# Patient Record
Sex: Male | Born: 1975 | Hispanic: No | Smoking: Current every day smoker
Health system: Southern US, Community
[De-identification: ages and names within clinical notes are randomized; demographics above are authoritative.]

## PROBLEM LIST (undated history)

## (undated) DIAGNOSIS — J45909 Unspecified asthma, uncomplicated: Secondary | ICD-10-CM

## (undated) DIAGNOSIS — I8500 Esophageal varices without bleeding: Secondary | ICD-10-CM

## (undated) DIAGNOSIS — I861 Scrotal varices: Secondary | ICD-10-CM

## (undated) DIAGNOSIS — K920 Hematemesis: Secondary | ICD-10-CM

## (undated) DIAGNOSIS — F19929 Other psychoactive substance use, unspecified with intoxication, unspecified: Secondary | ICD-10-CM

## (undated) DIAGNOSIS — B1920 Unspecified viral hepatitis C without hepatic coma: Secondary | ICD-10-CM

## (undated) DIAGNOSIS — F1994 Other psychoactive substance use, unspecified with psychoactive substance-induced mood disorder: Secondary | ICD-10-CM

## (undated) DIAGNOSIS — J40 Bronchitis, not specified as acute or chronic: Secondary | ICD-10-CM

## (undated) HISTORY — PX: SHOULDER SURGERY: SHX246

## (undated) HISTORY — DX: Unspecified viral hepatitis C without hepatic coma: B19.20

---
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
## 1998-05-24 ENCOUNTER — Encounter: Payer: Self-pay | Admitting: Emergency Medicine

## 1998-05-24 ENCOUNTER — Emergency Department (HOSPITAL_COMMUNITY): Admission: EM | Admit: 1998-05-24 | Discharge: 1998-05-24 | Payer: Self-pay | Admitting: Emergency Medicine

## 1998-05-29 ENCOUNTER — Encounter: Admission: RE | Admit: 1998-05-29 | Discharge: 1998-05-29 | Payer: Self-pay | Admitting: Sports Medicine

## 1998-06-09 ENCOUNTER — Encounter: Admission: RE | Admit: 1998-06-09 | Discharge: 1998-06-23 | Payer: Self-pay | Admitting: Orthopedic Surgery

## 1998-06-27 ENCOUNTER — Ambulatory Visit (HOSPITAL_BASED_OUTPATIENT_CLINIC_OR_DEPARTMENT_OTHER): Admission: RE | Admit: 1998-06-27 | Discharge: 1998-06-27 | Payer: Self-pay | Admitting: Orthopedic Surgery

## 1998-07-04 ENCOUNTER — Encounter: Admission: RE | Admit: 1998-07-04 | Discharge: 1998-07-04 | Payer: Self-pay | Admitting: Family Medicine

## 1998-07-05 ENCOUNTER — Emergency Department (HOSPITAL_COMMUNITY): Admission: EM | Admit: 1998-07-05 | Discharge: 1998-07-05 | Payer: Self-pay | Admitting: Emergency Medicine

## 1998-07-05 ENCOUNTER — Encounter: Payer: Self-pay | Admitting: Emergency Medicine

## 1998-07-22 ENCOUNTER — Encounter: Admission: RE | Admit: 1998-07-22 | Discharge: 1998-07-22 | Payer: Self-pay | Admitting: Family Medicine

## 1998-08-07 ENCOUNTER — Encounter: Admission: RE | Admit: 1998-08-07 | Discharge: 1998-08-07 | Payer: Self-pay | Admitting: Family Medicine

## 1998-12-01 ENCOUNTER — Encounter: Admission: RE | Admit: 1998-12-01 | Discharge: 1998-12-01 | Payer: Self-pay | Admitting: Family Medicine

## 1998-12-15 ENCOUNTER — Encounter: Admission: RE | Admit: 1998-12-15 | Discharge: 1998-12-15 | Payer: Self-pay | Admitting: Family Medicine

## 1999-01-09 ENCOUNTER — Ambulatory Visit (HOSPITAL_BASED_OUTPATIENT_CLINIC_OR_DEPARTMENT_OTHER): Admission: RE | Admit: 1999-01-09 | Discharge: 1999-01-09 | Payer: Self-pay | Admitting: Otolaryngology

## 1999-08-30 ENCOUNTER — Emergency Department (HOSPITAL_COMMUNITY): Admission: EM | Admit: 1999-08-30 | Discharge: 1999-08-30 | Payer: Self-pay | Admitting: Emergency Medicine

## 1999-10-24 ENCOUNTER — Emergency Department (HOSPITAL_COMMUNITY): Admission: EM | Admit: 1999-10-24 | Discharge: 1999-10-24 | Payer: Self-pay | Admitting: Internal Medicine

## 1999-11-30 ENCOUNTER — Emergency Department (HOSPITAL_COMMUNITY): Admission: EM | Admit: 1999-11-30 | Discharge: 1999-11-30 | Payer: Self-pay | Admitting: Emergency Medicine

## 2001-09-27 ENCOUNTER — Emergency Department (HOSPITAL_COMMUNITY): Admission: EM | Admit: 2001-09-27 | Discharge: 2001-09-27 | Payer: Self-pay | Admitting: Emergency Medicine

## 2001-09-28 ENCOUNTER — Emergency Department (HOSPITAL_COMMUNITY): Admission: EM | Admit: 2001-09-28 | Discharge: 2001-09-28 | Payer: Self-pay

## 2001-10-31 ENCOUNTER — Emergency Department (HOSPITAL_COMMUNITY): Admission: EM | Admit: 2001-10-31 | Discharge: 2001-10-31 | Payer: Self-pay | Admitting: Emergency Medicine

## 2001-11-07 ENCOUNTER — Emergency Department (HOSPITAL_COMMUNITY): Admission: EM | Admit: 2001-11-07 | Discharge: 2001-11-07 | Payer: Self-pay | Admitting: Emergency Medicine

## 2001-11-29 ENCOUNTER — Emergency Department (HOSPITAL_COMMUNITY): Admission: EM | Admit: 2001-11-29 | Discharge: 2001-11-29 | Payer: Self-pay | Admitting: *Deleted

## 2001-12-29 ENCOUNTER — Encounter: Payer: Self-pay | Admitting: Emergency Medicine

## 2001-12-29 ENCOUNTER — Emergency Department (HOSPITAL_COMMUNITY): Admission: EM | Admit: 2001-12-29 | Discharge: 2001-12-29 | Payer: Self-pay | Admitting: Emergency Medicine

## 2002-01-03 ENCOUNTER — Encounter: Payer: Self-pay | Admitting: Emergency Medicine

## 2002-01-03 ENCOUNTER — Emergency Department (HOSPITAL_COMMUNITY): Admission: EM | Admit: 2002-01-03 | Discharge: 2002-01-03 | Payer: Self-pay | Admitting: Emergency Medicine

## 2004-07-19 ENCOUNTER — Emergency Department (HOSPITAL_COMMUNITY): Admission: EM | Admit: 2004-07-19 | Discharge: 2004-07-19 | Payer: Self-pay | Admitting: Emergency Medicine

## 2004-08-03 ENCOUNTER — Emergency Department (HOSPITAL_COMMUNITY): Admission: EM | Admit: 2004-08-03 | Discharge: 2004-08-03 | Payer: Self-pay | Admitting: Emergency Medicine

## 2004-12-27 ENCOUNTER — Emergency Department (HOSPITAL_COMMUNITY): Admission: EM | Admit: 2004-12-27 | Discharge: 2004-12-27 | Payer: Self-pay | Admitting: Emergency Medicine

## 2005-02-03 ENCOUNTER — Emergency Department (HOSPITAL_COMMUNITY): Admission: EM | Admit: 2005-02-03 | Discharge: 2005-02-03 | Payer: Self-pay | Admitting: Emergency Medicine

## 2005-03-07 ENCOUNTER — Emergency Department (HOSPITAL_COMMUNITY): Admission: EM | Admit: 2005-03-07 | Discharge: 2005-03-08 | Payer: Self-pay | Admitting: Emergency Medicine

## 2005-03-08 ENCOUNTER — Emergency Department (HOSPITAL_COMMUNITY): Admission: EM | Admit: 2005-03-08 | Discharge: 2005-03-08 | Payer: Self-pay | Admitting: Emergency Medicine

## 2005-04-21 ENCOUNTER — Emergency Department (HOSPITAL_COMMUNITY): Admission: EM | Admit: 2005-04-21 | Discharge: 2005-04-21 | Payer: Self-pay | Admitting: *Deleted

## 2005-12-18 ENCOUNTER — Emergency Department (HOSPITAL_COMMUNITY): Admission: EM | Admit: 2005-12-18 | Discharge: 2005-12-18 | Payer: Self-pay | Admitting: Emergency Medicine

## 2006-08-28 ENCOUNTER — Emergency Department (HOSPITAL_COMMUNITY): Admission: EM | Admit: 2006-08-28 | Discharge: 2006-08-28 | Payer: Self-pay | Admitting: Emergency Medicine

## 2006-09-09 ENCOUNTER — Emergency Department (HOSPITAL_COMMUNITY): Admission: EM | Admit: 2006-09-09 | Discharge: 2006-09-09 | Payer: Self-pay | Admitting: Emergency Medicine

## 2007-04-12 ENCOUNTER — Emergency Department (HOSPITAL_COMMUNITY): Admission: EM | Admit: 2007-04-12 | Discharge: 2007-04-12 | Payer: Self-pay | Admitting: Emergency Medicine

## 2007-06-09 ENCOUNTER — Emergency Department (HOSPITAL_COMMUNITY): Admission: EM | Admit: 2007-06-09 | Discharge: 2007-06-09 | Payer: Self-pay | Admitting: Emergency Medicine

## 2007-08-10 ENCOUNTER — Emergency Department (HOSPITAL_COMMUNITY): Admission: EM | Admit: 2007-08-10 | Discharge: 2007-08-10 | Payer: Self-pay | Admitting: Emergency Medicine

## 2008-08-15 ENCOUNTER — Emergency Department (HOSPITAL_COMMUNITY): Admission: EM | Admit: 2008-08-15 | Discharge: 2008-08-15 | Payer: Self-pay | Admitting: Emergency Medicine

## 2009-01-05 ENCOUNTER — Emergency Department (HOSPITAL_COMMUNITY): Admission: EM | Admit: 2009-01-05 | Discharge: 2009-01-05 | Payer: Self-pay | Admitting: Emergency Medicine

## 2009-02-21 ENCOUNTER — Emergency Department (HOSPITAL_COMMUNITY): Admission: EM | Admit: 2009-02-21 | Discharge: 2009-02-21 | Payer: Self-pay | Admitting: Emergency Medicine

## 2009-05-27 ENCOUNTER — Emergency Department (HOSPITAL_COMMUNITY): Admission: EM | Admit: 2009-05-27 | Discharge: 2009-05-28 | Payer: Self-pay | Admitting: Emergency Medicine

## 2009-09-09 ENCOUNTER — Inpatient Hospital Stay (HOSPITAL_COMMUNITY): Admission: EM | Admit: 2009-09-09 | Discharge: 2009-09-13 | Payer: Self-pay | Admitting: Emergency Medicine

## 2009-09-16 ENCOUNTER — Emergency Department (HOSPITAL_COMMUNITY): Admission: EM | Admit: 2009-09-16 | Discharge: 2009-09-16 | Payer: Self-pay | Admitting: Emergency Medicine

## 2009-10-20 ENCOUNTER — Emergency Department (HOSPITAL_COMMUNITY): Admission: EM | Admit: 2009-10-20 | Discharge: 2009-10-20 | Payer: Self-pay | Admitting: Emergency Medicine

## 2010-02-02 ENCOUNTER — Emergency Department (HOSPITAL_COMMUNITY): Admission: EM | Admit: 2010-02-02 | Discharge: 2010-02-02 | Payer: Self-pay | Admitting: Emergency Medicine

## 2010-02-06 ENCOUNTER — Emergency Department (HOSPITAL_COMMUNITY): Admission: EM | Admit: 2010-02-06 | Discharge: 2010-02-06 | Payer: Self-pay | Admitting: Emergency Medicine

## 2010-02-17 ENCOUNTER — Emergency Department (HOSPITAL_COMMUNITY): Admission: EM | Admit: 2010-02-17 | Discharge: 2010-02-18 | Payer: Self-pay | Admitting: Emergency Medicine

## 2010-02-21 ENCOUNTER — Emergency Department (HOSPITAL_COMMUNITY): Admission: EM | Admit: 2010-02-21 | Discharge: 2010-02-21 | Payer: Self-pay | Admitting: Emergency Medicine

## 2010-04-28 ENCOUNTER — Emergency Department (HOSPITAL_COMMUNITY)
Admission: EM | Admit: 2010-04-28 | Discharge: 2010-04-28 | Payer: Self-pay | Source: Home / Self Care | Admitting: Emergency Medicine

## 2010-05-12 ENCOUNTER — Emergency Department (HOSPITAL_COMMUNITY)
Admission: EM | Admit: 2010-05-12 | Discharge: 2010-05-12 | Payer: Self-pay | Source: Home / Self Care | Admitting: Emergency Medicine

## 2010-05-25 ENCOUNTER — Emergency Department (HOSPITAL_COMMUNITY)
Admission: EM | Admit: 2010-05-25 | Discharge: 2010-05-25 | Payer: Self-pay | Source: Home / Self Care | Admitting: Emergency Medicine

## 2010-06-05 ENCOUNTER — Emergency Department (HOSPITAL_COMMUNITY)
Admission: EM | Admit: 2010-06-05 | Discharge: 2010-06-05 | Payer: Self-pay | Source: Home / Self Care | Admitting: Emergency Medicine

## 2010-07-11 ENCOUNTER — Emergency Department (HOSPITAL_COMMUNITY)
Admission: EM | Admit: 2010-07-11 | Discharge: 2010-07-11 | Disposition: A | Discharge status: Home / Self Care | Payer: Self-pay | Attending: Emergency Medicine | Admitting: Emergency Medicine

## 2010-07-11 DIAGNOSIS — J45909 Unspecified asthma, uncomplicated: Secondary | ICD-10-CM | POA: Insufficient documentation

## 2010-07-11 DIAGNOSIS — M79609 Pain in unspecified limb: Secondary | ICD-10-CM | POA: Insufficient documentation

## 2010-07-11 DIAGNOSIS — F411 Generalized anxiety disorder: Secondary | ICD-10-CM | POA: Insufficient documentation

## 2010-07-11 LAB — POCT I-STAT, CHEM 8
BUN: 14 mg/dL (ref 6–23)
Creatinine, Ser: 1 mg/dL (ref 0.4–1.5)
Glucose, Bld: 88 mg/dL (ref 70–99)
Potassium: 4 mEq/L (ref 3.5–5.1)
Sodium: 140 mEq/L (ref 135–145)

## 2010-08-05 LAB — COMPREHENSIVE METABOLIC PANEL
AST: 22 U/L (ref 0–37)
Albumin: 4.2 g/dL (ref 3.5–5.2)
Calcium: 8.9 mg/dL (ref 8.4–10.5)
Creatinine, Ser: 0.97 mg/dL (ref 0.4–1.5)
GFR calc Af Amer: >60 mL/min (ref >60)
GFR calc non Af Amer: >60 mL/min (ref >60)
Total Protein: 6.6 g/dL (ref 6.0–8.3)

## 2010-08-05 LAB — CBC
MCH: 29.2 pg (ref 26.0–34.0)
MCHC: 34.9 g/dL (ref 30.0–36.0)
Platelets: 242 10*3/uL (ref 150–400)
RDW: 12.4 % (ref 11.5–15.5)

## 2010-08-05 LAB — RAPID URINE DRUG SCREEN, HOSP PERFORMED
Amphetamines: NOT DETECTED
Cocaine: POSITIVE — AB
Opiates: NOT DETECTED
Tetrahydrocannabinol: POSITIVE — AB

## 2010-08-05 LAB — LIPASE, BLOOD: Lipase: 26 U/L (ref 11–59)

## 2010-08-05 LAB — POCT CARDIAC MARKERS
CKMB, poc: 1.8 ng/mL (ref 1.0–8.0)
Troponin i, poc: <0.05 ng/mL (ref 0.00–0.09)

## 2010-08-05 LAB — DIFFERENTIAL
Eosinophils Relative: 1 % (ref 0–5)
Lymphocytes Relative: 23 % (ref 12–46)
Lymphs Abs: 2.1 10*3/uL (ref 0.7–4.0)
Monocytes Relative: 8 % (ref 3–12)

## 2010-08-05 LAB — ETHANOL: Alcohol, Ethyl (B): 133 mg/dL — ABNORMAL HIGH (ref 0–10)

## 2010-08-11 LAB — RENAL FUNCTION PANEL
Albumin: 3.3 g/dL — ABNORMAL LOW (ref 3.5–5.2)
Albumin: 3.3 g/dL — ABNORMAL LOW (ref 3.5–5.2)
CO2: 24 mEq/L (ref 19–32)
Calcium: 8.7 mg/dL (ref 8.4–10.5)
GFR calc Af Amer: >60 mL/min (ref >60)
GFR calc Af Amer: >60 mL/min (ref >60)
GFR calc non Af Amer: >60 mL/min (ref >60)
Phosphorus: 2.4 mg/dL (ref 2.3–4.6)
Phosphorus: 3.5 mg/dL (ref 2.3–4.6)
Potassium: 3.9 mEq/L (ref 3.5–5.1)
Sodium: 139 mEq/L (ref 135–145)
Sodium: 142 mEq/L (ref 135–145)

## 2010-08-11 LAB — BASIC METABOLIC PANEL
Calcium: 8.1 mg/dL — ABNORMAL LOW (ref 8.4–10.5)
Creatinine, Ser: 0.94 mg/dL (ref 0.4–1.5)
GFR calc Af Amer: >60 mL/min (ref >60)
GFR calc non Af Amer: >60 mL/min (ref >60)
Glucose, Bld: 107 mg/dL — ABNORMAL HIGH (ref 70–99)
Sodium: 133 mEq/L — ABNORMAL LOW (ref 135–145)

## 2010-08-11 LAB — POCT I-STAT 3, ART BLOOD GAS (G3+)
Patient temperature: 98.6
TCO2: 27 mmol/L (ref 0–100)
pH, Arterial: 7.389 (ref 7.350–7.450)

## 2010-08-11 LAB — URINALYSIS, ROUTINE W REFLEX MICROSCOPIC
Bilirubin Urine: NEGATIVE
Hgb urine dipstick: NEGATIVE
Specific Gravity, Urine: 1.036 — ABNORMAL HIGH (ref 1.005–1.030)
pH: 7.5 (ref 5.0–8.0)

## 2010-08-11 LAB — POCT I-STAT, CHEM 8
Creatinine, Ser: 0.8 mg/dL (ref 0.4–1.5)
Hemoglobin: 20.7 g/dL — ABNORMAL HIGH (ref 13.0–17.0)
Sodium: 141 mEq/L (ref 135–145)
TCO2: 21 mmol/L (ref 0–100)

## 2010-08-11 LAB — CK
Total CK: 244 U/L — ABNORMAL HIGH (ref 7–232)
Total CK: 253 U/L — ABNORMAL HIGH (ref 7–232)

## 2010-08-11 LAB — COMPREHENSIVE METABOLIC PANEL
ALT: 21 U/L (ref 0–53)
Albumin: 4.4 g/dL (ref 3.5–5.2)
Alkaline Phosphatase: 101 U/L (ref 39–117)
Glucose, Bld: 117 mg/dL — ABNORMAL HIGH (ref 70–99)
Potassium: 4 mEq/L (ref 3.5–5.1)
Sodium: 140 mEq/L (ref 135–145)
Total Protein: 7.4 g/dL (ref 6.0–8.3)

## 2010-08-11 LAB — CBC
Hemoglobin: 14.1 g/dL (ref 13.0–17.0)
Hemoglobin: 18.1 g/dL — ABNORMAL HIGH (ref 13.0–17.0)
Platelets: 223 10*3/uL (ref 150–400)
Platelets: 307 10*3/uL (ref 150–400)
RBC: 4.7 MIL/uL (ref 4.22–5.81)
RDW: 12.9 % (ref 11.5–15.5)
RDW: 13.2 % (ref 11.5–15.5)
WBC: 15.7 10*3/uL — ABNORMAL HIGH (ref 4.0–10.5)

## 2010-08-11 LAB — RAPID URINE DRUG SCREEN, HOSP PERFORMED
Barbiturates: NOT DETECTED
Opiates: NOT DETECTED

## 2010-08-11 LAB — MYOGLOBIN, URINE: Myoglobin, Ur: <27 mcg/L (ref <28)

## 2010-08-11 LAB — URINE CULTURE: Colony Count: NO GROWTH

## 2010-08-11 LAB — ACETAMINOPHEN LEVEL: Acetaminophen (Tylenol), Serum: <10 ug/mL — ABNORMAL LOW (ref 10–30)

## 2010-08-11 LAB — ETHANOL: Alcohol, Ethyl (B): <5 mg/dL (ref 0–10)

## 2010-08-28 ENCOUNTER — Emergency Department (HOSPITAL_COMMUNITY)
Admission: EM | Admit: 2010-08-28 | Discharge: 2010-08-28 | Disposition: A | Discharge status: Home / Self Care | Payer: Self-pay | Attending: Emergency Medicine | Admitting: Emergency Medicine

## 2010-08-28 DIAGNOSIS — M542 Cervicalgia: Secondary | ICD-10-CM | POA: Insufficient documentation

## 2010-08-28 DIAGNOSIS — G8929 Other chronic pain: Secondary | ICD-10-CM | POA: Insufficient documentation

## 2010-08-28 DIAGNOSIS — F411 Generalized anxiety disorder: Secondary | ICD-10-CM | POA: Insufficient documentation

## 2010-08-28 DIAGNOSIS — J45909 Unspecified asthma, uncomplicated: Secondary | ICD-10-CM | POA: Insufficient documentation

## 2011-08-25 ENCOUNTER — Emergency Department (HOSPITAL_COMMUNITY)
Admission: EM | Admit: 2011-08-25 | Discharge: 2011-08-25 | Disposition: A | Discharge status: Home / Self Care | Payer: Self-pay | Attending: Emergency Medicine | Admitting: Emergency Medicine

## 2011-08-25 ENCOUNTER — Encounter (HOSPITAL_COMMUNITY): Payer: Self-pay | Admitting: *Deleted

## 2011-08-25 ENCOUNTER — Emergency Department (HOSPITAL_COMMUNITY): Payer: Self-pay

## 2011-08-25 DIAGNOSIS — M25511 Pain in right shoulder: Secondary | ICD-10-CM

## 2011-08-25 DIAGNOSIS — W108XXA Fall (on) (from) other stairs and steps, initial encounter: Secondary | ICD-10-CM | POA: Insufficient documentation

## 2011-08-25 DIAGNOSIS — M25519 Pain in unspecified shoulder: Secondary | ICD-10-CM | POA: Insufficient documentation

## 2011-08-25 DIAGNOSIS — R209 Unspecified disturbances of skin sensation: Secondary | ICD-10-CM | POA: Insufficient documentation

## 2011-08-25 DIAGNOSIS — S161XXA Strain of muscle, fascia and tendon at neck level, initial encounter: Secondary | ICD-10-CM

## 2011-08-25 DIAGNOSIS — S139XXA Sprain of joints and ligaments of unspecified parts of neck, initial encounter: Secondary | ICD-10-CM | POA: Insufficient documentation

## 2011-08-25 MED ORDER — HYDROMORPHONE HCL PF 2 MG/ML IJ SOLN
2.0000 mg | Freq: Once | INTRAMUSCULAR | Status: AC
  Administered 2011-08-25: 2 mg via INTRAMUSCULAR
  Filled 2011-08-25: qty 1

## 2011-08-25 MED ORDER — HYDROCODONE-ACETAMINOPHEN 5-500 MG PO TABS: 1.0000 | ORAL_TABLET | Freq: Four times a day (QID) | ORAL | Status: AC | PRN

## 2011-08-25 MED ORDER — DIAZEPAM 5 MG PO TABS: 5.0000 mg | ORAL_TABLET | Freq: Three times a day (TID) | ORAL | Status: AC | PRN

## 2011-08-25 MED ORDER — IBUPROFEN 600 MG PO TABS: 600.0000 mg | ORAL_TABLET | Freq: Four times a day (QID) | ORAL | Status: AC | PRN

## 2011-08-25 NOTE — Discharge Instructions (Signed)
Your x-ray did not show any abnormalities. Make sure you keep the sling and neck brace on as needed. Ice several times a day. Ibuprofen for pain. Vicodin for severe pain as prescribed as needed. Take valium as prescribed as needed for muscle spasms. Follow up with orthopedics as soon as possible.   Cervical Sprain A cervical sprain is an injury in the neck in which the ligaments are stretched or torn. The ligaments are the tissues that hold the bones of the neck (vertebrae) in place.Cervical sprains can range from very mild to very severe. Most cervical sprains get better in 1 to 3 weeks, but it depends on the cause and extent of the injury. Severe cervical sprains can cause the neck vertebrae to be unstable. This can lead to damage of the spinal cord and can result in serious nervous system problems. Your caregiver will determine whether your cervical sprain is mild or severe. CAUSES  Severe cervical sprains may be caused by:  Contact sport injuries (football, rugby, wrestling, hockey, auto racing, gymnastics, diving, martial arts, boxing).   Motor vehicle collisions.   Whiplash injuries. This means the neck is forcefully whipped backward and forward.   Falls.  Mild cervical sprains may be caused by:   Awkward positions, such as cradling a telephone between your ear and shoulder.   Sitting in a chair that does not offer proper support.   Working at a poorly Marketing executive station.   Activities that require looking up or down for long periods of time.  SYMPTOMS   Pain, soreness, stiffness, or a burning sensation in the front, back, or sides of the neck. This discomfort may develop immediately after injury or it may develop slowly and not begin for 24 hours or more after an injury.   Pain or tenderness directly in the middle of the back of the neck.   Shoulder or upper back pain.   Limited ability to move the neck.   Headache.   Dizziness.   Weakness, numbness, or tingling in  the hands or arms.   Muscle spasms.   Difficulty swallowing or chewing.   Tenderness and swelling of the neck.  DIAGNOSIS  Most of the time, your caregiver can diagnose this problem by taking your history and doing a physical exam. Your caregiver will ask about any known problems, such as arthritis in the neck or a previous neck injury. X-rays may be taken to find out if there are any other problems, such as problems with the bones of the neck. However, an X-ray often does not reveal the full extent of a cervical sprain. Other tests such as a computed tomography (CT) scan or magnetic resonance imaging (MRI) may be needed. TREATMENT  Treatment depends on the severity of the cervical sprain. Mild sprains can be treated with rest, keeping the neck in place (immobilization), and pain medicines. Severe cervical sprains need immediate immobilization and an appointment with an orthopedist or neurosurgeon. Several treatment options are available to help with pain, muscle spasms, and other symptoms. Your caregiver may prescribe:  Medicines, such as pain relievers, numbing medicines, or muscle relaxants.   Physical therapy. This can include stretching exercises, strengthening exercises, and posture training. Exercises and improved posture can help stabilize the neck, strengthen muscles, and help stop symptoms from returning.   A neck collar to be worn for short periods of time. Often, these collars are worn for comfort. However, certain collars may be worn to protect the neck and prevent further worsening of  a serious cervical sprain.  HOME CARE INSTRUCTIONS   Put ice on the injured area.   Put ice in a plastic bag.   Place a towel between your skin and the bag.   Leave the ice on for 15 to 20 minutes, 3 to 4 times a day.   Only take over-the-counter or prescription medicines for pain, discomfort, or fever as directed by your caregiver.   Keep all follow-up appointments as directed by your  caregiver.   Keep all physical therapy appointments as directed by your caregiver.   If a neck collar is prescribed, wear it as directed by your caregiver.   Do not drive while wearing a neck collar.   Make any needed adjustments to your work station to promote good posture.   Avoid positions and activities that make your symptoms worse.   Warm up and stretch before being active to help prevent problems.  SEEK MEDICAL CARE IF:   Your pain is not controlled with medicine.   You are unable to decrease your pain medicine over time as planned.   Your activity level is not improving as expected.  SEEK IMMEDIATE MEDICAL CARE IF:   You develop any bleeding, stomach upset, or signs of an allergic reaction to your medicine.   Your symptoms get worse.   You develop new, unexplained symptoms.   You have numbness, tingling, weakness, or paralysis in any part of your body.  MAKE SURE YOU:   Understand these instructions.   Will watch your condition.   Will get help right away if you are not doing well or get worse.  Document Released: 03/07/2007 Document Revised: 04/29/2011 Document Reviewed: 02/10/2011 [facility] Patient Information 2012 [place], [place].

## 2011-08-25 NOTE — ED Notes (Signed)
Pt fell and pulled rt side of neck PTA, no LOC, has burning and pain in rt neck and arm

## 2011-08-25 NOTE — ED Provider Notes (Signed)
Medical screening examination/treatment/procedure(s) were performed by non-physician practitioner and as supervising physician I was immediately available for consultation/collaboration.   Faria Casella, MD 08/25/11 2100 

## 2011-08-25 NOTE — ED Provider Notes (Signed)
History     CSN: [id number]  Arrival date & time 08/25/11  1717   First MD Initiated Contact with Patient 08/25/11 1739      Chief Complaint  Patient presents with  . Neck Injury    (Consider location/radiation/quality/duration/timing/severity/associated sxs/prior treatment) Patient is a 36 y.o. male presenting with neck injury. The history is provided by the patient.  Neck Injury This is a new problem. The current episode started today. The problem occurs constantly. The problem has been unchanged. Associated symptoms include neck pain and numbness. Pertinent negatives include no chills, fever or weakness.  pt states he was going up the steps when his left leg "gave out," states it has done so before. Pt states he landed on the right side, and thinks he hit his neck on the step. Reports pain in the neck, radiating down right arm. States right hand tingling. Denies weakness. Denies head injury or LOC. No other injuries.   Past Medical History  Diagnosis Date  . Asthma     Past Surgical History  Procedure Date  . Shoulder surgery     No family history on file.  History  Substance Use Topics  . Smoking status: Current Everyday Smoker  . Smokeless tobacco: Not on file  . Alcohol Use: Yes      Review of Systems  Constitutional: Negative for fever and chills.  HENT: Positive for neck pain.   Eyes: Negative.   Respiratory: Negative.   Cardiovascular: Negative.   Gastrointestinal: Negative.   Genitourinary: Negative.   Skin: Negative.   Neurological: Positive for numbness. Negative for weakness.  Psychiatric/Behavioral: Negative.     Allergies  Morphine and related  Home Medications   Current Outpatient Rx  Name Route Sig Dispense Refill  . ALBUTEROL SULFATE HFA 108 (90 BASE) MCG/ACT IN AERS Inhalation Inhale 2 puffs into the lungs every 6 (six) hours as needed. Wheezing/shortness of breath.    Marland Kitchen CALCIUM CARBONATE ANTACID 500 MG PO CHEW Oral Chew 1 tablet by  mouth daily as needed. Reflux.    . OMEPRAZOLE 20 MG PO CPDR Oral Take 20 mg by mouth daily.      BP 159/88  Pulse 105  Temp(Src) 98 F (36.7 C) (Oral)  Resp 20  Ht 5\' 4"  (1.626 m)  Wt 215 lb (97.523 kg)  BMI 36.90 kg/m2  SpO2 99%  Physical Exam  Nursing note and vitals reviewed. Constitutional: He is oriented to person, place, and time. He appears well-developed and well-nourished. No distress.  HENT:  Head: Normocephalic.  Eyes: Conjunctivae and EOM are normal. Pupils are equal, round, and reactive to light.  Neck: Neck supple.       Cervical midline tenderness. No swelling, step offs, right paravertebral swelling  Cardiovascular: Normal rate, regular rhythm and normal heart sounds.   Pulmonary/Chest: Effort normal and breath sounds normal.  Musculoskeletal:       Pain with any ROM of the neck. Pain with right shoulder flexion, extension, internal or external rotation. No pain with palpation over the shoulder  Neurological: He is alert and oriented to person, place, and time. No cranial nerve deficit.       Decreased sensation over right forearm and right hand when compared to left. Equal 5/5 grip strength bialterally  Skin: Skin is warm and dry.  Psychiatric: He has a normal mood and affect.    ED Course  Procedures (including critical care time)  Dg Cervical Spine Complete  08/25/2011  *RADIOLOGY REPORT*  Clinical Data:  36 year old male status post fall with pain.  CERVICAL SPINE - COMPLETE 4+ VIEW  Comparison: 06/05/2010 and earlier.  Findings: Stable cervical vertebral height and alignment.  Normal prevertebral soft tissues. Cervicothoracic junction alignment is within normal limits.  Bilateral posterior element alignment is within normal limits.  Mild scoliosis on the AP view.  Lung apices are clear.  C1-C2 alignment and odontoid within normal limits.  IMPRESSION: No acute fracture or listhesis identified in the cervical spine. Ligamentous injury is not excluded.  Original  Report Authenticated By: Harley Hallmark, M.D.    C-spine films negative. Pt able to move his neck in all 4 directions with pain pain. Pt has no weakness or sensory loss in right hand on exam. Given a sling and soft c collar for comfort. Will follow up with orthopedics, if symptoms continue he may need MRI. Instructed to return if weakness or numbness in that hand  No diagnosis found.    MDM          Lottie Mussel, PA 08/25/11 1946

## 2011-09-21 ENCOUNTER — Emergency Department (HOSPITAL_COMMUNITY)
Admission: EM | Admit: 2011-09-21 | Discharge: 2011-09-22 | Disposition: A | Discharge status: Home / Self Care | Payer: Self-pay | Attending: Emergency Medicine | Admitting: Emergency Medicine

## 2011-09-21 ENCOUNTER — Encounter (HOSPITAL_COMMUNITY): Payer: Self-pay | Admitting: Family Medicine

## 2011-09-21 DIAGNOSIS — Y9383 Activity, rough housing and horseplay: Secondary | ICD-10-CM | POA: Insufficient documentation

## 2011-09-21 DIAGNOSIS — Y92009 Unspecified place in unspecified non-institutional (private) residence as the place of occurrence of the external cause: Secondary | ICD-10-CM | POA: Insufficient documentation

## 2011-09-21 DIAGNOSIS — M549 Dorsalgia, unspecified: Secondary | ICD-10-CM | POA: Insufficient documentation

## 2011-09-21 DIAGNOSIS — X500XXA Overexertion from strenuous movement or load, initial encounter: Secondary | ICD-10-CM | POA: Insufficient documentation

## 2011-09-21 DIAGNOSIS — F17200 Nicotine dependence, unspecified, uncomplicated: Secondary | ICD-10-CM | POA: Insufficient documentation

## 2011-09-21 DIAGNOSIS — J45909 Unspecified asthma, uncomplicated: Secondary | ICD-10-CM | POA: Insufficient documentation

## 2011-09-21 DIAGNOSIS — Z79899 Other long term (current) drug therapy: Secondary | ICD-10-CM | POA: Insufficient documentation

## 2011-09-21 DIAGNOSIS — S139XXA Sprain of joints and ligaments of unspecified parts of neck, initial encounter: Secondary | ICD-10-CM | POA: Insufficient documentation

## 2011-09-21 DIAGNOSIS — S161XXA Strain of muscle, fascia and tendon at neck level, initial encounter: Secondary | ICD-10-CM

## 2011-09-21 DIAGNOSIS — Y998 Other external cause status: Secondary | ICD-10-CM | POA: Insufficient documentation

## 2011-09-21 HISTORY — DX: Esophageal varices without bleeding: I85.00

## 2011-09-21 HISTORY — DX: Bronchitis, not specified as acute or chronic: J40
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
## 2011-09-22 MED ORDER — DEXAMETHASONE SODIUM PHOSPHATE 10 MG/ML IJ SOLN
10.0000 mg | Freq: Once | INTRAMUSCULAR | Status: DC
  Filled 2011-09-22: qty 1

## 2011-09-22 MED ORDER — METHOCARBAMOL 500 MG PO TABS: 500.0000 mg | ORAL_TABLET | Freq: Two times a day (BID) | ORAL | Status: AC | PRN

## 2011-09-22 MED ORDER — OXYCODONE-ACETAMINOPHEN 5-325 MG PO TABS: 1.0000 | ORAL_TABLET | ORAL | Status: AC | PRN

## 2011-09-22 MED ORDER — OXYCODONE-ACETAMINOPHEN 5-325 MG PO TABS
2.0000 | ORAL_TABLET | Freq: Once | ORAL | Status: AC
  Administered 2011-09-22: 2 via ORAL
  Filled 2011-09-22: qty 2

## 2011-09-22 MED ORDER — DEXAMETHASONE SODIUM PHOSPHATE 10 MG/ML IJ SOLN
10.0000 mg | Freq: Once | INTRAMUSCULAR | Status: AC
  Administered 2011-09-22: 10 mg via INTRAMUSCULAR

## 2011-09-22 NOTE — ED Provider Notes (Signed)
History     CSN: [id number]  Arrival date & time 09/21/11  2109   First MD Initiated Contact with Patient 09/21/11 2339      Chief Complaint  Patient presents with  . Back Pain    (Consider location/radiation/quality/duration/timing/severity/associated sxs/prior treatment) HPI Comments: Patient complains of upper back pain that started when he was wrestling earlier today with his 35 year old son. He states that he felt acute onset of upper neck and upper back pain that is sharp and stabbing, constant, worse with range of motion of the arms or turning his head from side to side. He denies head injury, weakness of the arms or inability to walk. He has ambulated while in the emergency department. He states that the pain is severe, persistent and similar to neck injury and back muscle strain that he has had in the past. His recent medical visit to the hospital was for similar symptoms which resolved with pain medications and muscle relaxants.  Patient is a 36 y.o. male presenting with back pain. The history is provided by the patient and medical records.  Back Pain     Past Medical History  Diagnosis Date  . Asthma   . Bronchitis   . Esophageal varices     Past Surgical History  Procedure Date  . Shoulder surgery     No family history on file.  History  Substance Use Topics  . Smoking status: Current Everyday Smoker  . Smokeless tobacco: Not on file  . Alcohol Use: Yes      Review of Systems  HENT: Positive for neck pain and neck stiffness.   Musculoskeletal: Positive for back pain.    Allergies  Morphine and related and Vicodin  Home Medications   Current Outpatient Rx  Name Route Sig Dispense Refill  . ACETAMINOPHEN 500 MG PO TABS Oral Take 1,000 mg by mouth every 6 (six) hours as needed. For pain.    . ALBUTEROL SULFATE HFA 108 (90 BASE) MCG/ACT IN AERS Inhalation Inhale 2 puffs into the lungs every 6 (six) hours as needed. Wheezing/shortness of breath.    Marland Kitchen  CALCIUM CARBONATE ANTACID 500 MG PO CHEW Oral Chew 3 tablets by mouth 3 (three) times daily as needed. Reflux.    Marland Kitchen DIPHENHYDRAMINE HCL 25 MG PO TABS Oral Take 25 mg by mouth every 6 (six) hours as needed. For itching.    Marland Kitchen OMEPRAZOLE 20 MG PO CPDR Oral Take 20 mg by mouth 2 (two) times daily.     Marland Kitchen METHOCARBAMOL 500 MG PO TABS Oral Take 1 tablet (500 mg total) by mouth 2 (two) times daily as needed. 20 tablet 0  . OXYCODONE-ACETAMINOPHEN 5-325 MG PO TABS Oral Take 1 tablet by mouth every 4 (four) hours as needed for pain. May take 2 tablets PO q 6 hours for severe pain - Do not take with Tylenol as this tablet already contains tylenol 15 tablet 0    BP 124/71  Pulse 84  Temp(Src) 97.6 F (36.4 C) (Oral)  Resp 18  Wt 215 lb (97.523 kg)  SpO2 98%  Physical Exam  Nursing note and vitals reviewed. Constitutional: He appears well-developed and well-nourished. No distress.       Uncomfortable appearing.  HENT:  Head: Normocephalic and atraumatic.  Mouth/Throat: Oropharynx is clear and moist. No oropharyngeal exudate.  Eyes: Conjunctivae are normal. No scleral icterus.  Neck: Tracheal deviation: ttp over the muscles of the neck - paraspinal, no central ttp. No thyromegaly present.  Cardiovascular: Normal  rate, regular rhythm and normal heart sounds.        Normal radial artery pulses  Pulmonary/Chest: Effort normal and breath sounds normal. No respiratory distress. He has no wheezes.  Musculoskeletal: Normal range of motion. He exhibits tenderness ( rhomboid area ttp). He exhibits no edema.  Lymphadenopathy:    He has no cervical adenopathy.  Neurological:       No weakness or numbness of hands or UE's.  Skin: Skin is warm and dry. No rash noted. He is not diaphoretic. No erythema.    ED Course  Procedures (including critical care time)  Labs Reviewed - No data to display No results found.   1. Cervical strain       MDM  Muscle strain - no neuro defecits - no indication for  imagina  Decadron Percocet  Discharge Prescriptions include:  Percocet Robaxin        Vida Roller, MD 09/22/11 [phone]

## 2011-10-20 ENCOUNTER — Emergency Department (HOSPITAL_COMMUNITY)
Admission: EM | Admit: 2011-10-20 | Discharge: 2011-10-21 | Disposition: A | Discharge status: Home / Self Care | Payer: Self-pay | Attending: Emergency Medicine | Admitting: Emergency Medicine

## 2011-10-20 ENCOUNTER — Encounter (HOSPITAL_COMMUNITY): Payer: Self-pay | Admitting: Emergency Medicine

## 2011-10-20 DIAGNOSIS — J45909 Unspecified asthma, uncomplicated: Secondary | ICD-10-CM | POA: Insufficient documentation

## 2011-10-20 DIAGNOSIS — S161XXA Strain of muscle, fascia and tendon at neck level, initial encounter: Secondary | ICD-10-CM

## 2011-10-20 DIAGNOSIS — S139XXA Sprain of joints and ligaments of unspecified parts of neck, initial encounter: Secondary | ICD-10-CM | POA: Insufficient documentation

## 2011-10-20 DIAGNOSIS — X500XXA Overexertion from strenuous movement or load, initial encounter: Secondary | ICD-10-CM | POA: Insufficient documentation

## 2011-10-20 DIAGNOSIS — M79609 Pain in unspecified limb: Secondary | ICD-10-CM | POA: Insufficient documentation

## 2011-10-20 DIAGNOSIS — M542 Cervicalgia: Secondary | ICD-10-CM | POA: Insufficient documentation

## 2011-10-20 DIAGNOSIS — F17200 Nicotine dependence, unspecified, uncomplicated: Secondary | ICD-10-CM | POA: Insufficient documentation

## 2011-10-20 NOTE — ED Notes (Signed)
Pt states he is here tonight for neck pain that is radiating down into his back  Pt states he has degenerative disc disease and he picked something up wrong and the pain is shooting down his back into his legs  Sxs started a week ago

## 2011-10-21 ENCOUNTER — Emergency Department (HOSPITAL_COMMUNITY): Payer: Self-pay

## 2011-10-21 MED ORDER — DIAZEPAM 5 MG PO TABS
5.0000 mg | ORAL_TABLET | Freq: Once | ORAL | Status: AC
  Administered 2011-10-21: 5 mg via ORAL
  Filled 2011-10-21: qty 1

## 2011-10-21 MED ORDER — METHOCARBAMOL 500 MG PO TABS: 500.0000 mg | ORAL_TABLET | Freq: Two times a day (BID) | ORAL | Status: AC | PRN

## 2011-10-21 MED ORDER — HYDROMORPHONE HCL PF 2 MG/ML IJ SOLN
2.0000 mg | Freq: Once | INTRAMUSCULAR | Status: AC
  Administered 2011-10-21: 2 mg via INTRAMUSCULAR
  Filled 2011-10-21: qty 1

## 2011-10-21 MED ORDER — OXYCODONE-ACETAMINOPHEN 5-325 MG PO TABS: 1.0000 | ORAL_TABLET | ORAL | Status: AC | PRN

## 2011-10-21 NOTE — ED Provider Notes (Signed)
History     CSN: [id number]  Arrival date & time 10/20/11  2316   First MD Initiated Contact with Patient 10/21/11 0015      Chief Complaint  Patient presents with  . Neck Pain    (Consider location/radiation/quality/duration/timing/severity/associated sxs/prior treatment) The history is provided by the patient and medical records.   patient reports lifting something heavy 5 days ago in his back yard with resultant pain in the right side of his neck.  He denies weakness of his upper or lower tremors.  He does report the pain shoots down his arms bilaterally.  He denies truncal weakness.  He denies chest pain or shortness of breath.  He has no numbness or tingling.  No other complaints.  He reports allergies to morphine and Vicodin.  He reports he can take ibuprofen secondary to esophageal "lesions".  The patient reports a history of degenerative disc disease but has no insurance and does not have a primary care physician as he is unable to afford this.  Past Medical History  Diagnosis Date  . Asthma   . Bronchitis   . Esophageal varices     Past Surgical History  Procedure Date  . Shoulder surgery     Family History  Problem Relation Age of Onset  . Hypertension Other     History  Substance Use Topics  . Smoking status: Current Everyday Smoker    Types: Cigarettes  . Smokeless tobacco: Not on file  . Alcohol Use: No      Review of Systems  All other systems reviewed and are negative.    Allergies  Morphine and related and Vicodin  Home Medications   Current Outpatient Rx  Name Route Sig Dispense Refill  . ACETAMINOPHEN 500 MG PO TABS Oral Take 1,000 mg by mouth every 6 (six) hours as needed. For pain.    . ALBUTEROL SULFATE HFA 108 (90 BASE) MCG/ACT IN AERS Inhalation Inhale 2 puffs into the lungs every 6 (six) hours as needed. Wheezing/shortness of breath.    Marland Kitchen CALCIUM CARBONATE ANTACID 500 MG PO CHEW Oral Chew 3 tablets by mouth 3 (three) times daily as  needed. Reflux.    Marland Kitchen MENTHOL (TOPICAL ANALGESIC) 5 % EX PADS Apply externally Apply 1 patch topically every 12 (twelve) hours as needed. Back pain    . OMEPRAZOLE 20 MG PO CPDR Oral Take 20 mg by mouth 2 (two) times daily.     Marland Kitchen METHOCARBAMOL 500 MG PO TABS Oral Take 1 tablet (500 mg total) by mouth 2 (two) times daily as needed (spasms). 20 tablet 0  . OXYCODONE-ACETAMINOPHEN 5-325 MG PO TABS Oral Take 1 tablet by mouth every 4 (four) hours as needed for pain. 15 tablet 0    BP 134/83  Pulse 107  Temp(Src) 98.7 F (37.1 C) (Oral)  Resp 20  SpO2 98%  Physical Exam  Nursing note and vitals reviewed. Constitutional: He is oriented to person, place, and time. He appears well-developed and well-nourished.  HENT:  Head: Normocephalic and atraumatic.  Eyes: EOM are normal.  Neck: Normal range of motion. Neck supple. No tracheal deviation present.       No C-spine tenderness.  Paracervical spine tenderness with spasm on the right  Cardiovascular: Normal rate, regular rhythm, normal heart sounds and intact distal pulses.   Pulmonary/Chest: Effort normal and breath sounds normal. No respiratory distress.  Abdominal: Soft. He exhibits no distension. There is no tenderness.  Musculoskeletal: Normal range of motion.  Lymphadenopathy:  He has no cervical adenopathy.  Neurological: He is alert and oriented to person, place, and time.  Skin: Skin is warm and dry.  Psychiatric: He has a normal mood and affect. Judgment normal.    ED Course  Procedures (including critical care time)  Labs Reviewed - No data to display Dg Cervical Spine Complete  10/21/2011  *RADIOLOGY REPORT*  Clinical Data: Lifting injury, neck pain  CERVICAL SPINE - COMPLETE 4+ VIEW  Comparison: 08/25/2011.  Findings: No visible cervical spine fracture or traumatic subluxation.  No prevertebral soft tissue swelling.  No evidence for neural foraminal narrowing.  Lung apices clear.  Negative odontoid.  IMPRESSION: Negative  exam.  Original Report Authenticated By: Elsie Stain, M.D.     1. Cervical strain       MDM  Cervical strain with spasm.  Spasm treated in the emergency department.  The patient had his pain treated as well.  Will send the patient home with a prescription for Percocet with Robaxin for spasm.  The patient understands the importance of developing relationship with a primary care doctor for ongoing pain needs as this is becoming a chronic issue        Lyanne Co, MD 10/21/11 [phone]

## 2011-10-21 NOTE — Discharge Instructions (Signed)

## 2011-11-16 ENCOUNTER — Emergency Department (HOSPITAL_COMMUNITY)
Admission: EM | Admit: 2011-11-16 | Discharge: 2011-11-16 | Disposition: A | Discharge status: Home / Self Care | Payer: Self-pay | Attending: Emergency Medicine | Admitting: Emergency Medicine

## 2011-11-16 ENCOUNTER — Emergency Department (HOSPITAL_COMMUNITY): Payer: Self-pay

## 2011-11-16 ENCOUNTER — Encounter (HOSPITAL_COMMUNITY): Payer: Self-pay | Admitting: *Deleted

## 2011-11-16 DIAGNOSIS — T148XXA Other injury of unspecified body region, initial encounter: Secondary | ICD-10-CM

## 2011-11-16 DIAGNOSIS — W12XXXA Fall on and from scaffolding, initial encounter: Secondary | ICD-10-CM | POA: Insufficient documentation

## 2011-11-16 DIAGNOSIS — Z87891 Personal history of nicotine dependence: Secondary | ICD-10-CM | POA: Insufficient documentation

## 2011-11-16 DIAGNOSIS — Y9301 Activity, walking, marching and hiking: Secondary | ICD-10-CM | POA: Insufficient documentation

## 2011-11-16 DIAGNOSIS — Y9289 Other specified places as the place of occurrence of the external cause: Secondary | ICD-10-CM | POA: Insufficient documentation

## 2011-11-16 DIAGNOSIS — S8010XA Contusion of unspecified lower leg, initial encounter: Secondary | ICD-10-CM | POA: Insufficient documentation

## 2011-11-16 DIAGNOSIS — Y990 Civilian activity done for income or pay: Secondary | ICD-10-CM | POA: Insufficient documentation

## 2011-11-16 MED ORDER — OXYCODONE-ACETAMINOPHEN 5-325 MG PO TABS
2.0000 | ORAL_TABLET | Freq: Once | ORAL | Status: AC
  Administered 2011-11-16: 2 via ORAL
  Filled 2011-11-16: qty 2

## 2011-11-16 MED ORDER — TRAMADOL HCL 50 MG PO TABS: 50.0000 mg | ORAL_TABLET | Freq: Four times a day (QID) | ORAL | Status: AC | PRN

## 2011-11-16 MED ORDER — HYDROMORPHONE HCL PF 1 MG/ML IJ SOLN
1.0000 mg | Freq: Once | INTRAMUSCULAR | Status: AC
  Administered 2011-11-16: 1 mg via INTRAMUSCULAR
  Filled 2011-11-16: qty 1

## 2011-11-16 NOTE — Discharge Instructions (Signed)
You were seen and evaluated for your left lower leg pains. Your x-rays were negative today without signs for broken bones or other serious injury. This time your providers feel your symptoms are caused from bruising and swelling. Use rest, ice, compression and elevation to help with your symptoms. Please followup with your primary care provider or orthopedic specialist.   Contusion A contusion is a deep bruise. Contusions are the result of an injury that caused bleeding under the skin. The contusion may turn blue, purple, or yellow. Minor injuries will give you a painless contusion, but more severe contusions may stay painful and swollen for a few weeks.  CAUSES  A contusion is usually caused by a blow, trauma, or direct force to an area of the body. SYMPTOMS   Swelling and redness of the injured area.   Bruising of the injured area.   Tenderness and soreness of the injured area.   Pain.  DIAGNOSIS  The diagnosis can be made by taking a history and physical exam. An X-ray, CT scan, or MRI may be needed to determine if there were any associated injuries, such as fractures. TREATMENT  Specific treatment will depend on what area of the body was injured. In general, the best treatment for a contusion is resting, icing, elevating, and applying cold compresses to the injured area. Over-the-counter medicines may also be recommended for pain control. Ask your caregiver what the best treatment is for your contusion. HOME CARE INSTRUCTIONS   Put ice on the injured area.   Put ice in a plastic bag.   Place a towel between your skin and the bag.   Leave the ice on for 15 to 20 minutes, 3 to 4 times a day.   Only take over-the-counter or prescription medicines for pain, discomfort, or fever as directed by your caregiver. Your caregiver may recommend avoiding anti-inflammatory medicines (aspirin, ibuprofen, and naproxen) for 48 hours because these medicines may increase bruising.   Rest the injured  area.   If possible, elevate the injured area to reduce swelling.  SEEK IMMEDIATE MEDICAL CARE IF:   You have increased bruising or swelling.   You have pain that is getting worse.   Your swelling or pain is not relieved with medicines.  MAKE SURE YOU:   Understand these instructions.   Will watch your condition.   Will get help right away if you are not doing well or get worse.  Document Released: 02/17/2005 Document Revised: 04/29/2011 Document Reviewed: 03/15/2011 [facility] Patient Information 2012 [place], [place].    RESOURCE GUIDE  Chronic Pain Problems: Contact [facility]  [phone] Patients need to be referred by their primary care doctor.  Insufficient Money for Medicine: Contact United Way:  call "211" or [facility] [phone].  No Primary Care Doctor: - Call Health Connect  [phone] - can help you locate a primary care doctor that  accepts your insurance, provides certain services, etc. - Physician Referral [phone]  Agencies that provide inexpensive medical care: - [facility]  [phone] - [facility]  [phone] - [facility]  [phone] [facility]  [phone] - Planned Parenthood  [phone] [facility]  [phone]  Medicaid-accepting [facility] Providers: - [facility]- [address]  [phone], Mon-Fri 9am-7pm, Sat 9am-1pm - [facility]- [address] [place], [address] [place]  [phone] - [facility]- [address]  [phone] [facility]- [address]  [phone] - Renaye Rakers- [address] [place], [address], [phone]  Only accepts [place] [facility] [place] patients after they have their name  applied to their card  Self Pay (no insurance) in [facility]: - Sickle Cell Patients: Dr Willey Blade, [facility]  [address] [place],  [phone] - [facility]- [address] [place]  [phone]       [facility] [place]- [address]       -     [facility]- see information above (Speak to [employer] if you do not have insurance)       -  [facility]- [address] [place], [phone]       -  [facility]- [address],  [phone]       -  [facility]- [address] [phone]       -  Dr Julio Sicks-  [address], [place], [phone]       -  [facility]- [address] [phone]       -  [facility]- [address], [phone], also [address], [phone]       -    [facility]- [address] [place], [phone], 1st & 3rd Saturday   every month, 10am-1pm  1) Find a Doctor and Pay Out of Pocket Although you won't have to find out who is covered by your insurance plan, it is a good idea to ask around and get recommendations. You will then need to call the office and see if the doctor you have chosen will accept you as a new patient and what types of options they offer for patients who are self-pay. Some doctors offer discounts or will set up payment plans for their patients who do not have insurance, but you will need to ask so you aren't surprised when you get to your appointment.  2) Contact Your Local Health Department Not all health departments have doctors that can see patients for sick visits, but many do, so it is worth a call to see if yours does. If you don't know where your local health department is, you can check in your phone book. The CDC also has a tool to help you locate your state's health department, and many state websites also have listings of all of their local health departments.  3) Find a Walk-in Clinic If your illness is not likely to be very severe or complicated, you may want to try a walk in clinic. These are popping up all over the country in pharmacies,  drugstores, and shopping centers. They're usually staffed by nurse practitioners or physician assistants that have been trained to treat common illnesses and complaints. They're usually fairly quick and inexpensive. However, if you have serious medical issues or chronic medical problems, these are probably not your best option  STD Testing - [facility] Department of [facility] [place], STD Clinic, [address], [place], phone [phone] or [phone].  Monday - Friday, call for an appointment. [facility] Department of [employer], STD Clinic, [address], [place], phone [phone] or [phone].  Monday - Friday, call for an appointment.  Abuse/Neglect: [facility] Child Abuse Hotline [phone] [facility]  Child Abuse Hotline 667-829-8013 (After Hours)  Emergency Shelter:  [facility] [phone]  Maternity Homes: - Room at the [place] [facility] [phone] - [facility] [phone]  MRSA Hotline #:   (828)868-3521  [facility] Resources  [facility]  United Way [facility] Dept. [address]                 [address]         [address] [place] [address]  Blondell Reveal Phone:  [phone]                                  Phone:  [phone]                   Phone:  [phone]  [facility], [phone] - [phone]       -     [facility] in [place], [address],                                  ([phone], [facility] Child Abuse Hotline 347 134 7850 or 301-243-9908 (After Hours)   Behavioral Health Services  Substance Abuse Resources: - [facility] Drug [facility]  604-060-6143 - [facility]  (319) [phone] - [facility] [place] [phone] Floydene Flock [phone] - Residential & Outpatient Substance Abuse Program  ([phone]  Psychological Services: [facility]  [phone] Services  ([phone] - [facility], [phone] [place]. [address], [place], ACCESS LINE: (540) 049-2751 or (531)[phone], [employer]  Dental Assistance  If unable to pay or uninsured, contact:  [facility] or [facility]. to become qualified for the adult dental clinic.  Patients with Medicaid: [facility] [phone] [address], [phone] [address] [phone]  If unable to pay, or uninsured, contact [facility] [phone]) or [facility] Department [phone] in [place], [phone] in [place]) to become qualified for the adult dental clinic  Other Low-Cost Community Dental Services: - [facility]- [address] [place], [place], [place], [address], [phone], [address]. [phone], 2nd and 4th Thursday of the month at 6:30am.  10 clients each day by appointment, can sometimes see walk-in patients if someone does not show for an appointment. [facility]- [address] [place], [place], [address], [phone] - [facility]- [address] [place], [place], [address], [phone] - [facility]- [phone] [facility]- [phone] [facility] Department- [phone]

## 2011-11-16 NOTE — ED Provider Notes (Signed)
History     CSN: [id number]  Arrival date & time 11/16/11  1753   First MD Initiated Contact with Patient 11/16/11 2004      Chief Complaint  Patient presents with  . Leg Pain   HPI  History provided by the patient. Patient is a 36 year old male history of asthma bronchitis presents with complaints of lower leg injury and pain. 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 Patient fell and hit his left leg behind his knee and calf area. Patient has had some bruising and pain and reports some sharp electrical type pain shooting up the posterior part of his thigh. Pain is worse with any movements and walking. Patient has been trying to rest his leg and his use warm Epsom salt soaks. Patient is also rubbed icy hot over the area without significant improvements. Patient denies any numbness or weakness in the leg.    Past Medical History  Diagnosis Date  . Asthma   . Bronchitis   . Esophageal varices     Past Surgical History  Procedure Date  . Shoulder surgery     Family History  Problem Relation Age of Onset  . Hypertension Other     History  Substance Use Topics  . Smoking status: Former Smoker    Types: Cigarettes  . Smokeless tobacco: Not on file  . Alcohol Use: No      Review of Systems  Gastrointestinal: Negative for nausea.  Musculoskeletal: Positive for myalgias. Negative for joint swelling.  Neurological: Negative for weakness and numbness.    Allergies  Morphine and related and Vicodin  Home Medications   Current Outpatient Rx  Name Route Sig Dispense Refill  . ALBUTEROL SULFATE HFA 108 (90 BASE) MCG/ACT IN AERS Inhalation Inhale 2 puffs into the lungs every 6 (six) hours as needed. Wheezing/shortness of breath.    Marland Kitchen CALCIUM CARBONATE ANTACID 500 MG PO CHEW Oral Chew 3 tablets by mouth 3 (three) times daily as needed. Reflux.    . OMEPRAZOLE 20 MG PO CPDR Oral Take 20 mg by mouth 2 (two) times  daily.       There were no vitals taken for this visit.  Physical Exam  Nursing note and vitals reviewed. Constitutional: He is oriented to person, place, and time. He appears well-developed and well-nourished. No distress.  HENT:  Head: Normocephalic.  Cardiovascular: Normal rate and regular rhythm.   Pulmonary/Chest: Effort normal and breath sounds normal.  Abdominal: Soft.  Musculoskeletal: He exhibits tenderness. He exhibits no edema.       Left lower leg: He exhibits tenderness.       Legs:      Bruising with tenderness to the lateral posterior part of proximal left lower leg near popliteal fossa. Normal range of motion of knee. No significant signs for joint effusion of knee. Bruising does extend over proximal fibula. Tendon the biceps femoris palpated and appears intact to proximal fibula head.  Normal dorsal pedal pulses and sensation in foot. Normal strength and movement and foot and toes.  Neurological: He is alert and oriented to person, place, and time.  Skin: Skin is warm.  Psychiatric: He has a normal mood and affect.    ED Course  Procedures  Dg Knee Complete 4 Views Left  11/16/2011  *RADIOLOGY REPORT*  Clinical Data: Posterior left knee pain after fall 5 days ago.  LEFT KNEE - COMPLETE 4+ VIEW  Comparison: None.  Findings: The left knee appears intact. No evidence of acute fracture or subluxation.  No focal bone lesions.  Bone matrix and cortex appear intact.  No abnormal radiopaque densities in the soft tissues.  IMPRESSION: No acute bony abnormalities.  Original Report Authenticated By: Marlon Pel, M.D.     1. Contusion       MDM  8:05 PM patient seen and evaluated. Patient no acute distress.        Angus Seller, [place] 11/16/11 2130

## 2011-11-16 NOTE — Progress Notes (Signed)
Orthopedic Tech Progress Note Patient Details:  Chris Randall 1976/02/11 [id number]  Ortho Devices Type of Ortho Device: Crutches Ortho Device/Splint Interventions: Application   [name] 11/16/2011, 9:37 PM

## 2011-11-16 NOTE — ED Notes (Signed)
Pt works on high beams.  Pt reports that Fri he fell and left lower leg hit a metal beam.  Pt states the pain has been increasing to the point he can no longer tolerate.  Pain shoots up the front and the back of his left leg.  Bruising noted.  Pt alert oriented X4
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
## 2011-11-17 NOTE — ED Provider Notes (Signed)
Medical screening examination/treatment/procedure(s) were performed by non-physician practitioner and as supervising physician I was immediately available for consultation/collaboration.   Lelon Ikard, MD 11/17/11 0024 

## 2011-12-17 ENCOUNTER — Encounter (HOSPITAL_COMMUNITY): Payer: Self-pay | Admitting: Emergency Medicine

## 2011-12-17 ENCOUNTER — Emergency Department (HOSPITAL_COMMUNITY)
Admission: EM | Admit: 2011-12-17 | Discharge: 2011-12-18 | Disposition: A | Discharge status: Home / Self Care | Payer: Self-pay | Attending: Emergency Medicine | Admitting: Emergency Medicine

## 2011-12-17 DIAGNOSIS — L509 Urticaria, unspecified: Secondary | ICD-10-CM | POA: Insufficient documentation

## 2011-12-17 DIAGNOSIS — Z8249 Family history of ischemic heart disease and other diseases of the circulatory system: Secondary | ICD-10-CM | POA: Insufficient documentation

## 2011-12-17 DIAGNOSIS — J45909 Unspecified asthma, uncomplicated: Secondary | ICD-10-CM | POA: Insufficient documentation

## 2011-12-17 DIAGNOSIS — I10 Essential (primary) hypertension: Secondary | ICD-10-CM | POA: Insufficient documentation

## 2011-12-17 DIAGNOSIS — T7840XA Allergy, unspecified, initial encounter: Secondary | ICD-10-CM | POA: Insufficient documentation

## 2011-12-17 DIAGNOSIS — Z87891 Personal history of nicotine dependence: Secondary | ICD-10-CM | POA: Insufficient documentation

## 2011-12-17 MED ORDER — FAMOTIDINE IN NACL 20-0.9 MG/50ML-% IV SOLN
20.0000 mg | Freq: Once | INTRAVENOUS | Status: AC
  Administered 2011-12-17: 20 mg via INTRAVENOUS
  Filled 2011-12-17: qty 50

## 2011-12-17 MED ORDER — METHYLPREDNISOLONE SODIUM SUCC 125 MG IJ SOLR
125.0000 mg | Freq: Once | INTRAMUSCULAR | Status: AC
  Administered 2011-12-17: 125 mg via INTRAVENOUS
  Filled 2011-12-17: qty 2

## 2011-12-17 NOTE — ED Notes (Signed)
FAO:ZH08<MV> Expected date:<BR> Expected time:<BR> Means of arrival:Ambulance<BR> Comments:<BR> 24M/Sting/Hives

## 2011-12-17 NOTE — ED Notes (Signed)
Pt stung by an insect tonight on his left chest area.  Pt covered in a rash.  No breathing difficulties.  Area is painful and itching.  Pt. Reporting that he has swollen lips and blurred vision.  EMS gave him 50 mg. Of benadryl po.

## 2011-12-18 ENCOUNTER — Emergency Department (HOSPITAL_COMMUNITY): Payer: Self-pay

## 2011-12-18 MED ORDER — PREDNISONE 20 MG PO TABS: ORAL_TABLET | ORAL | Status: AC

## 2011-12-18 MED ORDER — ONDANSETRON HCL 4 MG/2ML IJ SOLN
4.0000 mg | Freq: Once | INTRAMUSCULAR | Status: AC
  Administered 2011-12-18: 4 mg via INTRAVENOUS
  Filled 2011-12-18: qty 2

## 2011-12-18 MED ORDER — HYDROXYZINE HCL 25 MG PO TABS: 25.0000 mg | ORAL_TABLET | Freq: Four times a day (QID) | ORAL | Status: AC

## 2011-12-18 MED ORDER — NICOTINE POLACRILEX 2 MG MT GUM
2.0000 mg | CHEWING_GUM | OROMUCOSAL | Status: DC | PRN
  Administered 2011-12-18: 2 mg via ORAL
  Filled 2011-12-18: qty 1

## 2011-12-18 MED ORDER — LORAZEPAM 0.5 MG PO TABS
0.5000 mg | ORAL_TABLET | Freq: Once | ORAL | Status: AC
  Administered 2011-12-18: 0.5 mg via ORAL
  Filled 2011-12-18: qty 1

## 2011-12-18 MED ORDER — HYDROXYZINE HCL 50 MG/ML IM SOLN
25.0000 mg | Freq: Four times a day (QID) | INTRAMUSCULAR | Status: DC | PRN
Start: 2011-12-18 — End: 2011-12-18
  Administered 2011-12-18: 25 mg via INTRAMUSCULAR
  Filled 2011-12-18: qty 2

## 2011-12-18 MED ORDER — FAMOTIDINE 20 MG PO TABS: 20.0000 mg | ORAL_TABLET | Freq: Two times a day (BID) | ORAL | Status: DC

## 2011-12-18 MED ORDER — HYDROMORPHONE HCL PF 1 MG/ML IJ SOLN
1.0000 mg | Freq: Once | INTRAMUSCULAR | Status: AC
  Administered 2011-12-18: 1 mg via INTRAVENOUS
  Filled 2011-12-18: qty 1

## 2011-12-18 MED ORDER — KETOROLAC TROMETHAMINE 30 MG/ML IJ SOLN
30.0000 mg | Freq: Once | INTRAMUSCULAR | Status: AC
  Administered 2011-12-18: 30 mg via INTRAVENOUS
  Filled 2011-12-18: qty 1

## 2011-12-18 NOTE — ED Provider Notes (Signed)
Medical screening examination/treatment/procedure(s) were performed by non-physician practitioner and as supervising physician I was immediately available for consultation/collaboration.  Derwood Kaplan, MD 12/18/11 1453
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
## 2012-04-07 ENCOUNTER — Encounter (HOSPITAL_COMMUNITY): Payer: Self-pay | Admitting: *Deleted

## 2012-04-07 ENCOUNTER — Emergency Department (HOSPITAL_COMMUNITY)
Admission: EM | Admit: 2012-04-07 | Discharge: 2012-04-08 | Disposition: A | Discharge status: Home / Self Care | Payer: Self-pay | Attending: Emergency Medicine | Admitting: Emergency Medicine

## 2012-04-07 DIAGNOSIS — S335XXA Sprain of ligaments of lumbar spine, initial encounter: Secondary | ICD-10-CM | POA: Insufficient documentation

## 2012-04-07 DIAGNOSIS — Y929 Unspecified place or not applicable: Secondary | ICD-10-CM | POA: Insufficient documentation

## 2012-04-07 DIAGNOSIS — S39012A Strain of muscle, fascia and tendon of lower back, initial encounter: Secondary | ICD-10-CM

## 2012-04-07 DIAGNOSIS — J45909 Unspecified asthma, uncomplicated: Secondary | ICD-10-CM | POA: Insufficient documentation

## 2012-04-07 DIAGNOSIS — I8500 Esophageal varices without bleeding: Secondary | ICD-10-CM | POA: Insufficient documentation

## 2012-04-07 DIAGNOSIS — S46909A Unspecified injury of unspecified muscle, fascia and tendon at shoulder and upper arm level, unspecified arm, initial encounter: Secondary | ICD-10-CM | POA: Insufficient documentation

## 2012-04-07 DIAGNOSIS — IMO0002 Reserved for concepts with insufficient information to code with codable children: Secondary | ICD-10-CM | POA: Insufficient documentation

## 2012-04-07 DIAGNOSIS — S4980XA Other specified injuries of shoulder and upper arm, unspecified arm, initial encounter: Secondary | ICD-10-CM | POA: Insufficient documentation

## 2012-04-07 DIAGNOSIS — W1789XA Other fall from one level to another, initial encounter: Secondary | ICD-10-CM | POA: Insufficient documentation

## 2012-04-07 DIAGNOSIS — Y939 Activity, unspecified: Secondary | ICD-10-CM | POA: Insufficient documentation

## 2012-04-07 DIAGNOSIS — F17200 Nicotine dependence, unspecified, uncomplicated: Secondary | ICD-10-CM | POA: Insufficient documentation

## 2012-04-07 DIAGNOSIS — Z79899 Other long term (current) drug therapy: Secondary | ICD-10-CM | POA: Insufficient documentation

## 2012-04-07 DIAGNOSIS — S46911A Strain of unspecified muscle, fascia and tendon at shoulder and upper arm level, right arm, initial encounter: Secondary | ICD-10-CM
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
## 2012-04-08 ENCOUNTER — Emergency Department (HOSPITAL_COMMUNITY): Payer: Self-pay

## 2012-04-08 MED ORDER — OXYCODONE-ACETAMINOPHEN 5-325 MG PO TABS: 2.0000 | ORAL_TABLET | ORAL | Status: DC | PRN

## 2012-04-08 MED ORDER — OXYCODONE-ACETAMINOPHEN 5-325 MG PO TABS
2.0000 | ORAL_TABLET | Freq: Once | ORAL | Status: AC
  Administered 2012-04-08: 2 via ORAL
  Filled 2012-04-08: qty 2

## 2012-04-08 MED ORDER — DIAZEPAM 5 MG PO TABS
10.0000 mg | ORAL_TABLET | Freq: Once | ORAL | Status: AC
  Administered 2012-04-08: 10 mg via ORAL
  Filled 2012-04-08: qty 2

## 2012-04-08 MED ORDER — KETOROLAC TROMETHAMINE 60 MG/2ML IM SOLN
60.0000 mg | Freq: Once | INTRAMUSCULAR | Status: AC
  Administered 2012-04-08: 60 mg via INTRAMUSCULAR
  Filled 2012-04-08: qty 2

## 2012-04-08 MED ORDER — METHOCARBAMOL 750 MG PO TABS: 750.0000 mg | ORAL_TABLET | Freq: Four times a day (QID) | ORAL | Status: DC

## 2012-04-08 MED ORDER — HYDROMORPHONE HCL PF 1 MG/ML IJ SOLN
1.0000 mg | Freq: Once | INTRAMUSCULAR | Status: AC
  Administered 2012-04-08: 1 mg via INTRAMUSCULAR
  Filled 2012-04-08 (×2): qty 1

## 2012-04-08 MED ORDER — NAPROXEN 500 MG PO TABS: 500.0000 mg | ORAL_TABLET | Freq: Two times a day (BID) | ORAL | Status: DC
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
## 2012-04-28 ENCOUNTER — Encounter (HOSPITAL_COMMUNITY): Payer: Self-pay | Admitting: *Deleted

## 2012-04-28 ENCOUNTER — Emergency Department (HOSPITAL_COMMUNITY): Payer: Self-pay

## 2012-04-28 ENCOUNTER — Emergency Department (HOSPITAL_COMMUNITY)
Admission: EM | Admit: 2012-04-28 | Discharge: 2012-04-28 | Disposition: A | Discharge status: Home / Self Care | Payer: Self-pay | Attending: Emergency Medicine | Admitting: Emergency Medicine

## 2012-04-28 ENCOUNTER — Other Ambulatory Visit: Payer: Self-pay

## 2012-04-28 DIAGNOSIS — R05 Cough: Secondary | ICD-10-CM | POA: Insufficient documentation

## 2012-04-28 DIAGNOSIS — Y9389 Activity, other specified: Secondary | ICD-10-CM | POA: Insufficient documentation

## 2012-04-28 DIAGNOSIS — S23429A Unspecified sprain of sternum, initial encounter: Secondary | ICD-10-CM | POA: Insufficient documentation

## 2012-04-28 DIAGNOSIS — J45909 Unspecified asthma, uncomplicated: Secondary | ICD-10-CM | POA: Insufficient documentation

## 2012-04-28 DIAGNOSIS — Z79899 Other long term (current) drug therapy: Secondary | ICD-10-CM | POA: Insufficient documentation

## 2012-04-28 DIAGNOSIS — R059 Cough, unspecified: Secondary | ICD-10-CM | POA: Insufficient documentation

## 2012-04-28 DIAGNOSIS — F17200 Nicotine dependence, unspecified, uncomplicated: Secondary | ICD-10-CM | POA: Insufficient documentation

## 2012-04-28 DIAGNOSIS — Z8709 Personal history of other diseases of the respiratory system: Secondary | ICD-10-CM | POA: Insufficient documentation

## 2012-04-28 DIAGNOSIS — Z8679 Personal history of other diseases of the circulatory system: Secondary | ICD-10-CM | POA: Insufficient documentation

## 2012-04-28 DIAGNOSIS — S29011A Strain of muscle and tendon of front wall of thorax, initial encounter: Secondary | ICD-10-CM

## 2012-04-28 LAB — BASIC METABOLIC PANEL
BUN: 14 mg/dL (ref 6–23)
Chloride: 102 mEq/L (ref 96–112)
Creatinine, Ser: 1.27 mg/dL (ref 0.50–1.35)
GFR calc Af Amer: 83 mL/min — ABNORMAL LOW (ref >90)

## 2012-04-28 LAB — CBC
HCT: 46.3 % (ref 39.0–52.0)
MCHC: 33.3 g/dL (ref 30.0–36.0)
MCV: 84.2 fL (ref 78.0–100.0)
RDW: 12.7 % (ref 11.5–15.5)
WBC: 10.7 10*3/uL — ABNORMAL HIGH (ref 4.0–10.5)

## 2012-04-28 MED ORDER — GI COCKTAIL ~~LOC~~
30.0000 mL | Freq: Once | ORAL | Status: AC
  Administered 2012-04-28: 30 mL via ORAL
  Filled 2012-04-28: qty 30

## 2012-04-28 MED ORDER — DIAZEPAM 5 MG PO TABS
5.0000 mg | ORAL_TABLET | Freq: Once | ORAL | Status: AC
  Administered 2012-04-28: 5 mg via ORAL
  Filled 2012-04-28: qty 1

## 2012-04-28 MED ORDER — OXYCODONE-ACETAMINOPHEN 5-325 MG PO TABS: 2.0000 | ORAL_TABLET | ORAL | Status: DC | PRN

## 2012-04-28 MED ORDER — OXYCODONE-ACETAMINOPHEN 5-325 MG PO TABS
2.0000 | ORAL_TABLET | Freq: Once | ORAL | Status: AC
  Administered 2012-04-28: 2 via ORAL
  Filled 2012-04-28: qty 2

## 2012-04-28 MED ORDER — DIAZEPAM 5 MG PO TABS: 5.0000 mg | ORAL_TABLET | Freq: Three times a day (TID) | ORAL | Status: DC | PRN
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
## 2012-04-28 NOTE — ED Notes (Signed)
Pt is here with cough and cold for 2 days.  Pt states that he has sob with this and tightness in his chest.  Cough has been productive for white sputum "with black flakes in it".  CP increases with coughing.  Pt reports chills with this.

## 2012-04-28 NOTE — ED Notes (Signed)
Pt states his ride is here.

## 2012-05-23 ENCOUNTER — Encounter (HOSPITAL_COMMUNITY): Payer: Self-pay | Admitting: *Deleted

## 2012-05-23 ENCOUNTER — Emergency Department (HOSPITAL_COMMUNITY): Payer: Self-pay

## 2012-05-23 ENCOUNTER — Emergency Department (HOSPITAL_COMMUNITY)
Admission: EM | Admit: 2012-05-23 | Discharge: 2012-05-23 | Disposition: A | Discharge status: Home / Self Care | Payer: Self-pay | Attending: Emergency Medicine | Admitting: Emergency Medicine

## 2012-05-23 DIAGNOSIS — Y9389 Activity, other specified: Secondary | ICD-10-CM | POA: Insufficient documentation

## 2012-05-23 DIAGNOSIS — Y929 Unspecified place or not applicable: Secondary | ICD-10-CM | POA: Insufficient documentation

## 2012-05-23 DIAGNOSIS — W208XXA Other cause of strike by thrown, projected or falling object, initial encounter: Secondary | ICD-10-CM | POA: Insufficient documentation

## 2012-05-23 DIAGNOSIS — Z79899 Other long term (current) drug therapy: Secondary | ICD-10-CM | POA: Insufficient documentation

## 2012-05-23 DIAGNOSIS — M545 Low back pain: Secondary | ICD-10-CM

## 2012-05-23 DIAGNOSIS — F17200 Nicotine dependence, unspecified, uncomplicated: Secondary | ICD-10-CM | POA: Insufficient documentation

## 2012-05-23 DIAGNOSIS — S20229A Contusion of unspecified back wall of thorax, initial encounter: Secondary | ICD-10-CM | POA: Insufficient documentation

## 2012-05-23 DIAGNOSIS — Z8709 Personal history of other diseases of the respiratory system: Secondary | ICD-10-CM | POA: Insufficient documentation

## 2012-05-23 DIAGNOSIS — J45909 Unspecified asthma, uncomplicated: Secondary | ICD-10-CM | POA: Insufficient documentation

## 2012-05-23 MED ORDER — OXYCODONE-ACETAMINOPHEN 5-325 MG PO TABS: 1.0000 | ORAL_TABLET | Freq: Four times a day (QID) | ORAL | Status: DC | PRN

## 2012-05-23 MED ORDER — IBUPROFEN 400 MG PO TABS
600.0000 mg | ORAL_TABLET | Freq: Once | ORAL | Status: AC
  Administered 2012-05-23: 600 mg via ORAL
  Filled 2012-05-23: qty 1

## 2012-05-23 MED ORDER — HYDROCODONE-ACETAMINOPHEN 5-325 MG PO TABS: 2.0000 | ORAL_TABLET | Freq: Once | ORAL | Status: DC

## 2012-05-23 MED ORDER — OXYCODONE-ACETAMINOPHEN 5-325 MG PO TABS
1.0000 | ORAL_TABLET | Freq: Once | ORAL | Status: AC
  Administered 2012-05-23: 1 via ORAL
  Filled 2012-05-23: qty 1

## 2012-05-23 MED ORDER — IBUPROFEN 600 MG PO TABS: 600.0000 mg | ORAL_TABLET | Freq: Three times a day (TID) | ORAL | Status: DC | PRN

## 2012-05-23 NOTE — ED Notes (Signed)
Patient transported to X-ray 
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
## 2012-05-23 NOTE — ED Notes (Addendum)
Patient is alert and orientedx4.  Patient was explained discharge instructions and he understood them with no questions.  [facility] is coming to transport the patient home.
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
## 2012-06-02 ENCOUNTER — Emergency Department (HOSPITAL_COMMUNITY)
Admission: EM | Admit: 2012-06-02 | Discharge: 2012-06-02 | Disposition: A | Discharge status: Home / Self Care | Payer: Self-pay | Attending: Emergency Medicine | Admitting: Emergency Medicine

## 2012-06-02 DIAGNOSIS — Z8679 Personal history of other diseases of the circulatory system: Secondary | ICD-10-CM | POA: Insufficient documentation

## 2012-06-02 DIAGNOSIS — J45909 Unspecified asthma, uncomplicated: Secondary | ICD-10-CM | POA: Insufficient documentation

## 2012-06-02 DIAGNOSIS — R5381 Other malaise: Secondary | ICD-10-CM | POA: Insufficient documentation

## 2012-06-02 DIAGNOSIS — IMO0002 Reserved for concepts with insufficient information to code with codable children: Secondary | ICD-10-CM | POA: Insufficient documentation

## 2012-06-02 DIAGNOSIS — Y929 Unspecified place or not applicable: Secondary | ICD-10-CM | POA: Insufficient documentation

## 2012-06-02 DIAGNOSIS — F17200 Nicotine dependence, unspecified, uncomplicated: Secondary | ICD-10-CM | POA: Insufficient documentation

## 2012-06-02 DIAGNOSIS — Y9389 Activity, other specified: Secondary | ICD-10-CM | POA: Insufficient documentation

## 2012-06-02 DIAGNOSIS — W208XXA Other cause of strike by thrown, projected or falling object, initial encounter: Secondary | ICD-10-CM | POA: Insufficient documentation

## 2012-06-02 DIAGNOSIS — Z8709 Personal history of other diseases of the respiratory system: Secondary | ICD-10-CM | POA: Insufficient documentation

## 2012-06-02 DIAGNOSIS — M545 Low back pain: Secondary | ICD-10-CM

## 2012-06-02 MED ORDER — OXYCODONE-ACETAMINOPHEN 5-325 MG PO TABS: 1.0000 | ORAL_TABLET | Freq: Four times a day (QID) | ORAL | Status: DC | PRN

## 2012-06-02 MED ORDER — DIAZEPAM 5 MG PO TABS: 5.0000 mg | ORAL_TABLET | Freq: Two times a day (BID) | ORAL | Status: DC
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
## 2012-06-02 NOTE — ED Notes (Signed)
Pt states he injured his lower back 2 weeks ago and it is not getting better. Pt states he has lower back pain that radiates down both legs. Pt describes pain as burning and shooting pain which is worse on the R side. Pt also states he is out of Percocet. Pt states he has a ride home. Pt ambulatory to exam room with steady gait.

## 2012-06-09 ENCOUNTER — Encounter (HOSPITAL_COMMUNITY): Payer: Self-pay | Admitting: *Deleted

## 2012-06-09 ENCOUNTER — Emergency Department (HOSPITAL_COMMUNITY)
Admission: EM | Admit: 2012-06-09 | Discharge: 2012-06-10 | Disposition: A | Discharge status: Home / Self Care | Payer: Self-pay | Attending: Emergency Medicine | Admitting: Emergency Medicine

## 2012-06-09 DIAGNOSIS — S0990XA Unspecified injury of head, initial encounter: Secondary | ICD-10-CM | POA: Insufficient documentation

## 2012-06-09 DIAGNOSIS — Y9389 Activity, other specified: Secondary | ICD-10-CM | POA: Insufficient documentation

## 2012-06-09 DIAGNOSIS — J45909 Unspecified asthma, uncomplicated: Secondary | ICD-10-CM | POA: Insufficient documentation

## 2012-06-09 DIAGNOSIS — S43401A Unspecified sprain of right shoulder joint, initial encounter: Secondary | ICD-10-CM

## 2012-06-09 DIAGNOSIS — Z79899 Other long term (current) drug therapy: Secondary | ICD-10-CM | POA: Insufficient documentation

## 2012-06-09 DIAGNOSIS — T148XXA Other injury of unspecified body region, initial encounter: Secondary | ICD-10-CM | POA: Insufficient documentation

## 2012-06-09 DIAGNOSIS — F17200 Nicotine dependence, unspecified, uncomplicated: Secondary | ICD-10-CM | POA: Insufficient documentation

## 2012-06-09 DIAGNOSIS — IMO0002 Reserved for concepts with insufficient information to code with codable children: Secondary | ICD-10-CM | POA: Insufficient documentation

## 2012-06-09 DIAGNOSIS — Z8709 Personal history of other diseases of the respiratory system: Secondary | ICD-10-CM | POA: Insufficient documentation

## 2012-06-09 DIAGNOSIS — Z9889 Other specified postprocedural states: Secondary | ICD-10-CM | POA: Insufficient documentation

## 2012-06-09 DIAGNOSIS — I8500 Esophageal varices without bleeding: Secondary | ICD-10-CM | POA: Insufficient documentation

## 2012-06-09 DIAGNOSIS — Y92410 Unspecified street and highway as the place of occurrence of the external cause: Secondary | ICD-10-CM | POA: Insufficient documentation
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
## 2012-06-09 NOTE — ED Provider Notes (Signed)
Medical screening examination/treatment/procedure(s) were performed by non-physician practitioner and as supervising physician I was immediately available for consultation/collaboration.   Hurman Horn, MD 06/09/12 2134

## 2012-06-10 ENCOUNTER — Emergency Department (HOSPITAL_COMMUNITY): Payer: Self-pay

## 2012-06-10 MED ORDER — FENTANYL CITRATE 0.05 MG/ML IJ SOLN
50.0000 ug | Freq: Once | INTRAMUSCULAR | Status: AC
  Administered 2012-06-10: 50 ug via INTRAVENOUS
  Filled 2012-06-10: qty 2

## 2012-06-10 NOTE — ED Notes (Signed)
Patient transported to X-ray 
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
## 2012-06-10 NOTE — ED Provider Notes (Signed)
Medical screening examination/treatment/procedure(s) were performed by non-physician practitioner and as supervising physician I was immediately available for consultation/collaboration.  Olivia Mackie, MD 06/10/12 [phone]

## 2012-07-13 ENCOUNTER — Emergency Department (HOSPITAL_COMMUNITY)
Admission: EM | Admit: 2012-07-13 | Discharge: 2012-07-13 | Disposition: A | Discharge status: Home / Self Care | Payer: Self-pay | Attending: Emergency Medicine | Admitting: Emergency Medicine

## 2012-07-13 ENCOUNTER — Encounter (HOSPITAL_COMMUNITY): Payer: Self-pay | Admitting: Emergency Medicine

## 2012-07-13 DIAGNOSIS — F17200 Nicotine dependence, unspecified, uncomplicated: Secondary | ICD-10-CM | POA: Insufficient documentation

## 2012-07-13 DIAGNOSIS — Z8709 Personal history of other diseases of the respiratory system: Secondary | ICD-10-CM | POA: Insufficient documentation

## 2012-07-13 DIAGNOSIS — K029 Dental caries, unspecified: Secondary | ICD-10-CM

## 2012-07-13 DIAGNOSIS — K089 Disorder of teeth and supporting structures, unspecified: Secondary | ICD-10-CM | POA: Insufficient documentation

## 2012-07-13 DIAGNOSIS — Z8719 Personal history of other diseases of the digestive system: Secondary | ICD-10-CM | POA: Insufficient documentation

## 2012-07-13 DIAGNOSIS — K0889 Other specified disorders of teeth and supporting structures: Secondary | ICD-10-CM

## 2012-07-13 DIAGNOSIS — J45909 Unspecified asthma, uncomplicated: Secondary | ICD-10-CM | POA: Insufficient documentation

## 2012-07-13 DIAGNOSIS — Z79899 Other long term (current) drug therapy: Secondary | ICD-10-CM | POA: Insufficient documentation

## 2012-07-13 MED ORDER — PENICILLIN V POTASSIUM 500 MG PO TABS: 500.0000 mg | ORAL_TABLET | Freq: Three times a day (TID) | ORAL | Status: DC

## 2012-07-13 MED ORDER — OXYCODONE-ACETAMINOPHEN 5-325 MG PO TABS: 1.0000 | ORAL_TABLET | ORAL | Status: DC | PRN

## 2012-07-13 MED ORDER — ALBUTEROL SULFATE HFA 108 (90 BASE) MCG/ACT IN AERS: 1.0000 | INHALATION_SPRAY | Freq: Four times a day (QID) | RESPIRATORY_TRACT | Status: DC | PRN

## 2012-07-13 MED ORDER — BUPIVACAINE-EPINEPHRINE PF 0.5-1:200000 % IJ SOLN
1.8000 mL | Freq: Once | INTRAMUSCULAR | Status: AC
Start: 2012-07-13 — End: 2012-07-13
  Administered 2012-07-13: 9 mg
  Filled 2012-07-13: qty 1.8

## 2012-07-13 NOTE — ED Notes (Signed)
Pt is c/o toothache on the top left  Pt states it is his wisdom tooth and it has a cavity in it

## 2012-07-13 NOTE — ED Provider Notes (Signed)
History     CSN: [id number]  Arrival date & time 07/13/12  4696   First MD Initiated Contact with Patient 07/13/12 [phone]      Chief Complaint  Patient presents with  . Dental Pain   HPI  History provided by the patient. Patient is a 37 year old male who presents with complaints of left upper molar dental pains. Patient states symptoms began 2 days ago. Pain started acutely and has been persistent. Pain is described as sharp and severe. Patient has been taking some over-the-counter pain medications without any relief. Pain is worse at any pressure or some feeding. Denies any other aggravating or alleviating factors. Patient denies any associated symptoms. No fever, chills or sweats.    Past Medical History  Diagnosis Date  . Asthma   . Bronchitis   . Esophageal varices     Past Surgical History  Procedure Laterality Date  . Shoulder surgery    . Rotator cuff repair  1995    Family History  Problem Relation Age of Onset  . Hypertension Other   . Cancer Other     History  Substance Use Topics  . Smoking status: Current Every Day Smoker -- 0.25 packs/day    Types: Cigarettes  . Smokeless tobacco: Not on file     Comment: 3 cigarettes a day  . Alcohol Use: No      Review of Systems  Constitutional: Negative for fever and chills.  All other systems reviewed and are negative.    Allergies  Morphine and related and Vicodin  Home Medications   Current Outpatient Rx  Name  Route  Sig  Dispense  Refill  . albuterol (PROVENTIL HFA;VENTOLIN HFA) 108 (90 BASE) MCG/ACT inhaler   Inhalation   Inhale 2 puffs into the lungs every 6 (six) hours as needed. Wheezing/shortness of breath.           BP 132/64  Pulse 80  Temp(Src) 98.4 F (36.9 C) (Oral)  Resp 22  SpO2 97%  Physical Exam  Nursing note and vitals reviewed. Constitutional: He is oriented to person, place, and time. He appears well-developed and well-nourished.  HENT:  Head: Normocephalic.    Mouth/Throat:    Patient with several extracted teeth throughout. There is a stand alone left upper molar with several dental caries. There is tenderness to percussion. No significant swelling to the adjacent gums. No swelling of the face cheek. There is pain to percussion of this tooth.  Eyes: Conjunctivae are normal.  Neck: Neck supple.  Cardiovascular: Normal rate and regular rhythm.   Pulmonary/Chest: Effort normal and breath sounds normal.  Abdominal: Soft.  Musculoskeletal: Normal range of motion.  Lymphadenopathy:    He has no cervical adenopathy.  Neurological: He is alert and oriented to person, place, and time.  Skin: Skin is warm.  Psychiatric: He has a normal mood and affect. His behavior is normal.    ED Course  Procedures  Dental Block Performed by: Angus Seller Authorized by: Angus Seller Consent: Verbal consent obtained. Risks and benefits: risks, benefits and alternatives were discussed Consent given by: patient Patient identity confirmed: provided demographic data  Location: Left upper second molar  Local anesthetic: Bupivacaine 0.5% with epinephrine  Anesthetic total: 1.8 ml  Irrigation method: syringe  Patient tolerance: Patient tolerated the procedure well with no immediate complications. Pain improved.     1. Pain, dental   2. Dental caries       MDM  Patient seen and evaluated. Patient appears in  moderate discomfort.  Patient with some improvements after the block. Patient given small prescription for Percocet and penicillin. Patient was given dental referral to follow up today.        Angus Seller, PA 07/13/12 [id number]  Angus Seller, PA 07/13/12 [phone]

## 2012-07-13 NOTE — ED Provider Notes (Signed)
Medical screening examination/treatment/procedure(s) were performed by non-physician practitioner and as supervising physician I was immediately available for consultation/collaboration.  Sunnie Nielsen, MD 07/13/12 [phone]

## 2012-07-18 ENCOUNTER — Emergency Department (HOSPITAL_COMMUNITY)
Admission: EM | Admit: 2012-07-18 | Discharge: 2012-07-18 | Disposition: A | Discharge status: Home / Self Care | Payer: Self-pay | Attending: Emergency Medicine | Admitting: Emergency Medicine

## 2012-07-18 ENCOUNTER — Encounter (HOSPITAL_COMMUNITY): Payer: Self-pay | Admitting: Emergency Medicine

## 2012-07-18 DIAGNOSIS — Z87828 Personal history of other (healed) physical injury and trauma: Secondary | ICD-10-CM | POA: Insufficient documentation

## 2012-07-18 DIAGNOSIS — M5380 Other specified dorsopathies, site unspecified: Secondary | ICD-10-CM | POA: Insufficient documentation

## 2012-07-18 DIAGNOSIS — F17200 Nicotine dependence, unspecified, uncomplicated: Secondary | ICD-10-CM | POA: Insufficient documentation

## 2012-07-18 DIAGNOSIS — Z8719 Personal history of other diseases of the digestive system: Secondary | ICD-10-CM | POA: Insufficient documentation

## 2012-07-18 DIAGNOSIS — J45909 Unspecified asthma, uncomplicated: Secondary | ICD-10-CM | POA: Insufficient documentation

## 2012-07-18 DIAGNOSIS — Z79899 Other long term (current) drug therapy: Secondary | ICD-10-CM | POA: Insufficient documentation

## 2012-07-18 DIAGNOSIS — M549 Dorsalgia, unspecified: Secondary | ICD-10-CM | POA: Insufficient documentation

## 2012-07-18 MED ORDER — OXYCODONE-ACETAMINOPHEN 5-325 MG PO TABS
2.0000 | ORAL_TABLET | Freq: Once | ORAL | Status: AC
  Administered 2012-07-18: 2 via ORAL
  Filled 2012-07-18: qty 2

## 2012-07-18 MED ORDER — OXYCODONE-ACETAMINOPHEN 5-325 MG PO TABS: 1.0000 | ORAL_TABLET | ORAL | Status: DC | PRN

## 2012-07-18 MED ORDER — DIAZEPAM 5 MG PO TABS
10.0000 mg | ORAL_TABLET | Freq: Once | ORAL | Status: AC
  Administered 2012-07-18: 10 mg via ORAL
  Filled 2012-07-18: qty 2

## 2012-07-18 MED ORDER — DIAZEPAM 5 MG PO TABS: 5.0000 mg | ORAL_TABLET | Freq: Three times a day (TID) | ORAL | Status: DC | PRN
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
## 2012-07-18 NOTE — ED Notes (Signed)
Per patient, states he is severe pain r/t doing physical labor yesterday

## 2012-07-18 NOTE — ED Notes (Signed)
Pt ambulatory to exam room with steady gait. Pt states he hurts all over. States his pain is 10/10. Pt states he has a ride home.

## 2012-07-18 NOTE — ED Provider Notes (Signed)
  Medical screening examination/treatment/procedure(s) were performed by non-physician practitioner and as supervising physician I was immediately available for consultation/collaboration.    Gerhard Munch, MD 07/18/12 [phone]

## 2012-07-30 ENCOUNTER — Encounter (HOSPITAL_COMMUNITY): Payer: Self-pay | Admitting: *Deleted

## 2012-07-30 ENCOUNTER — Emergency Department (HOSPITAL_COMMUNITY)
Admission: EM | Admit: 2012-07-30 | Discharge: 2012-07-30 | Disposition: A | Discharge status: Home / Self Care | Payer: Self-pay | Attending: Emergency Medicine | Admitting: Emergency Medicine

## 2012-07-30 ENCOUNTER — Emergency Department (HOSPITAL_COMMUNITY)
Admission: EM | Admit: 2012-07-30 | Discharge: 2012-07-31 | Disposition: A | Discharge status: Home / Self Care | Payer: Self-pay | Attending: Emergency Medicine | Admitting: Emergency Medicine

## 2012-07-30 DIAGNOSIS — J45901 Unspecified asthma with (acute) exacerbation: Secondary | ICD-10-CM | POA: Insufficient documentation

## 2012-07-30 DIAGNOSIS — F17200 Nicotine dependence, unspecified, uncomplicated: Secondary | ICD-10-CM | POA: Insufficient documentation

## 2012-07-30 DIAGNOSIS — J45909 Unspecified asthma, uncomplicated: Secondary | ICD-10-CM | POA: Insufficient documentation

## 2012-07-30 DIAGNOSIS — Z9889 Other specified postprocedural states: Secondary | ICD-10-CM | POA: Insufficient documentation

## 2012-07-30 DIAGNOSIS — G8929 Other chronic pain: Secondary | ICD-10-CM | POA: Insufficient documentation

## 2012-07-30 DIAGNOSIS — M542 Cervicalgia: Secondary | ICD-10-CM | POA: Insufficient documentation

## 2012-07-30 DIAGNOSIS — Z79899 Other long term (current) drug therapy: Secondary | ICD-10-CM | POA: Insufficient documentation

## 2012-07-30 DIAGNOSIS — M546 Pain in thoracic spine: Secondary | ICD-10-CM | POA: Insufficient documentation

## 2012-07-30 DIAGNOSIS — M5412 Radiculopathy, cervical region: Secondary | ICD-10-CM | POA: Insufficient documentation

## 2012-07-30 DIAGNOSIS — Z8719 Personal history of other diseases of the digestive system: Secondary | ICD-10-CM | POA: Insufficient documentation

## 2012-07-30 DIAGNOSIS — M47812 Spondylosis without myelopathy or radiculopathy, cervical region: Secondary | ICD-10-CM | POA: Insufficient documentation

## 2012-07-30 MED ORDER — ALBUTEROL SULFATE (5 MG/ML) 0.5% IN NEBU
5.0000 mg | INHALATION_SOLUTION | Freq: Once | RESPIRATORY_TRACT | Status: AC
  Administered 2012-07-30: 5 mg via RESPIRATORY_TRACT
  Filled 2012-07-30: qty 1

## 2012-07-30 MED ORDER — KETOROLAC TROMETHAMINE 30 MG/ML IJ SOLN
30.0000 mg | Freq: Once | INTRAMUSCULAR | Status: AC
  Administered 2012-07-30: 30 mg via INTRAMUSCULAR
  Filled 2012-07-30: qty 1

## 2012-07-30 MED ORDER — OXYCODONE-ACETAMINOPHEN 5-325 MG PO TABS: 1.0000 | ORAL_TABLET | ORAL | Status: DC | PRN

## 2012-07-30 MED ORDER — DIAZEPAM 5 MG PO TABS: 5.0000 mg | ORAL_TABLET | Freq: Four times a day (QID) | ORAL | Status: DC | PRN

## 2012-07-30 MED ORDER — OXYCODONE-ACETAMINOPHEN 5-325 MG PO TABS
2.0000 | ORAL_TABLET | Freq: Once | ORAL | Status: AC
  Administered 2012-07-30: 2 via ORAL
  Filled 2012-07-30: qty 2

## 2012-07-30 MED ORDER — DIAZEPAM 5 MG PO TABS
5.0000 mg | ORAL_TABLET | Freq: Once | ORAL | Status: AC
  Administered 2012-07-30: 5 mg via ORAL
  Filled 2012-07-30: qty 1

## 2012-07-30 MED ORDER — DIAZEPAM 5 MG/ML IJ SOLN
5.0000 mg | Freq: Once | INTRAMUSCULAR | Status: AC
  Administered 2012-07-30: 5 mg via INTRAMUSCULAR

## 2012-07-30 MED ORDER — DIAZEPAM 5 MG/ML IJ SOLN
5.0000 mg | Freq: Once | INTRAMUSCULAR | Status: DC
  Filled 2012-07-30: qty 2

## 2012-07-30 MED ORDER — KETOROLAC TROMETHAMINE 60 MG/2ML IM SOLN
60.0000 mg | Freq: Once | INTRAMUSCULAR | Status: AC
  Administered 2012-07-30: 60 mg via INTRAMUSCULAR
  Filled 2012-07-30: qty 2

## 2012-07-30 NOTE — ED Provider Notes (Signed)
Medical screening examination/treatment/procedure(s) were performed by non-physician practitioner and as supervising physician I was immediately available for consultation/collaboration.  Jones Skene, M.D.     Jones Skene, MD 07/30/12 1302

## 2012-07-30 NOTE — ED Notes (Signed)
Pt. C/o of some sob. Has asthma.

## 2012-07-30 NOTE — ED Provider Notes (Signed)
History     CSN: [id number]  Arrival date & time 07/30/12  0017   First MD Initiated Contact with Patient 07/30/12 0131      Chief Complaint  Patient presents with  . Back Pain    (Consider location/radiation/quality/duration/timing/severity/associated sxs/prior treatment) HPI Comments: Patient is a 37 year old male with a past medical history of chronic back pain who presents with gradual onset of upper back pain that started this morning. The pain is aching and severe and does radiate down his right arm. The pain is constant. Movement makes the pain worse. Nothing makes the pain better. Patient has tried Percocet for pain with some relief. No associated symptoms. No saddles paresthesias or bladder/bowel incontinence. 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     Patient is a 36 y.o. male presenting with back pain.  Back Pain   Past Medical History  Diagnosis Date  . Asthma   . Bronchitis   . Esophageal varices     Past Surgical History  Procedure Laterality Date  . Shoulder surgery    . Rotator cuff repair  1995    Family History  Problem Relation Age of Onset  . Hypertension Other   . Cancer Other     History  Substance Use Topics  . Smoking status: Current Every Day Smoker -- 0.25 packs/day    Types: Cigarettes  . Smokeless tobacco: Not on file     Comment: 3 cigarettes a day  . Alcohol Use: No      Review of Systems  Musculoskeletal: Positive for back pain.  All other systems reviewed and are negative.    Allergies  Morphine and related and Vicodin  Home Medications   Current Outpatient Rx  Name  Route  Sig  Dispense  Refill  . albuterol (PROVENTIL HFA;VENTOLIN HFA) 108 (90 BASE) MCG/ACT inhaler   Inhalation   Inhale 2 puffs into the lungs every 6 (six) hours as needed. Wheezing/shortness of breath.         . diazepam (VALIUM) 5 MG tablet   Oral   Take 1  tablet (5 mg total) by mouth every 8 (eight) hours as needed for anxiety (Take 1 tablet every 8 hours as needed for muscle spasms.).   20 tablet   0   . omeprazole (PRILOSEC OTC) 20 MG tablet   Oral   Take 20 mg by mouth daily.         Marland Kitchen oxyCODONE-acetaminophen (PERCOCET) 5-325 MG per tablet   Oral   Take 1 tablet by mouth every 4 (four) hours as needed for pain.   5 tablet   0   . oxyCODONE-acetaminophen (PERCOCET) 5-325 MG per tablet   Oral   Take 1 tablet by mouth every 4 (four) hours as needed for pain (Take 1- 2 tablets every 4 - 6 hours as needed for pain.).   20 tablet   0   . penicillin v potassium (VEETID) 500 MG tablet   Oral   Take 1 tablet (500 mg total) by mouth 3 (three) times daily.   30 tablet   0     BP 153/97  Pulse 95  Temp(Src) 0 F (-17.8 C)  Resp 16  SpO2 98%  Physical Exam  Nursing note and vitals reviewed. Constitutional: He is oriented to person, place, and time. He appears well-developed and well-nourished. No distress.  HENT:  Head: Normocephalic  and atraumatic.  Eyes: Conjunctivae are normal.  Neck: Normal range of motion. Neck supple.  Cardiovascular: Normal rate and regular rhythm.  Exam reveals no gallop and no friction rub.   No murmur heard. Pulmonary/Chest: Effort normal and breath sounds normal. He has no wheezes. He has no rales. He exhibits no tenderness.  Abdominal: Soft. He exhibits no distension. There is no tenderness. There is no rebound and no guarding.  Musculoskeletal: Normal range of motion.  Generalized thoracic paraspinal tenderness to palpation. No obvious deformity or focal midline tenderness.   Neurological: He is alert and oriented to person, place, and time. Coordination normal.  Speech is goal-oriented. Moves limbs without ataxia.   Skin: Skin is warm and dry.  Psychiatric: He has a normal mood and affect. His behavior is normal.    ED Course  Procedures (including critical care time)  Labs Reviewed - No  data to display No results found.   1. Chronic back pain       MDM  1:39 AM Patient will have toradol and valium for pain. No known injury. Back pain is chronic without acute changes.        Emilia Beck, [place] 07/30/12 [phone]

## 2012-07-30 NOTE — ED Provider Notes (Signed)
History     CSN: [id number]  Arrival date & time 07/30/12  1918   First MD Initiated Contact with Patient 07/30/12 2250      Chief Complaint  Patient presents with  . 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 Plans on staying again in the hospital tonight   Patient is a 37 y.o. male presenting with back pain. The history is provided by the patient.  Back Pain Quality:  Aching Radiates to:  R shoulder Pain severity:  Severe Onset quality:  Gradual Timing:  Constant Progression:  Worsening Chronicity:  Recurrent Relieved by:  Nothing Worsened by:  Movement Associated symptoms: no chest pain, no fever and no headaches     Past Medical History  Diagnosis Date  . Asthma   . Bronchitis   . Esophageal varices     Past Surgical History  Procedure Laterality Date  . Shoulder surgery    . Rotator cuff repair  1995    Family History  Problem Relation Age of Onset  . Hypertension Other   . Cancer Other     History  Substance Use Topics  . Smoking status: Current Every Day Smoker -- 0.25 packs/day    Types: Cigarettes  . Smokeless tobacco: Not on file     Comment: 3 cigarettes a day  . Alcohol Use: No      Review of Systems  Constitutional: Negative for fever and chills.  HENT: Positive for neck pain. Negative for congestion, rhinorrhea and neck stiffness.   Respiratory: Negative for cough and shortness of breath.   Cardiovascular: Negative for chest pain.  Musculoskeletal: Positive for back pain.  Skin: Negative for rash and wound.    Neurological: Negative for dizziness and headaches.  All other systems reviewed and are negative.    Allergies  Morphine and related and Vicodin  Home Medications   Current Outpatient Rx  Name  Route  Sig  Dispense  Refill  . albuterol (PROVENTIL HFA;VENTOLIN HFA) 108 (90 BASE) MCG/ACT inhaler   Inhalation   Inhale 2 puffs into the lungs every 6 (six) hours as needed. Wheezing/shortness of breath.         Marland Kitchen omeprazole (PRILOSEC OTC) 20 MG tablet   Oral   Take 20 mg by mouth 3 (three) times daily.         Marland Kitchen oxyCODONE-acetaminophen (PERCOCET) 5-325 MG per tablet   Oral   Take 1 tablet by mouth every 4 (four) hours as needed for pain.   5 tablet   0   . diazepam (VALIUM) 5 MG tablet   Oral   Take 1 tablet (5 mg total) by mouth every 8 (eight) hours as needed for anxiety (Take 1 tablet every 8 hours as needed for muscle spasms.).   20 tablet   0   . diazepam (VALIUM) 5 MG tablet   Oral   Take 1 tablet (5 mg total) by mouth every 6 (six) hours as needed for anxiety.   20 tablet   0   . penicillin v potassium (VEETID) 500 MG tablet   Oral   Take  1 tablet (500 mg total) by mouth 3 (three) times daily.   30 tablet   0     BP 137/76  Pulse 87  Temp(Src) 98 F (36.7 C) (Oral)  Resp 16  SpO2 100%  Physical Exam  Constitutional: He is oriented to person, place, and time. He appears well-developed and well-nourished.  HENT:  Head: Normocephalic.  Eyes: Pupils are equal, round, and reactive to light.  Neck: Muscular tenderness present. No spinous process tenderness present. Tracheal deviation: R>L.    Cardiovascular: Normal rate and regular rhythm.   Pulmonary/Chest: Effort normal.  Abdominal: Soft.  Musculoskeletal: Normal range of motion.  Neurological: He is alert and oriented to person, place, and time.  Skin: Skin is warm and dry.    ED Course  Procedures (including critical care time)  Labs Reviewed - No data to display No results found.   1.  Cervical radicular pain       MDM  Again treated for pain have given Rx for Valium and referral to neurosurgeon for further evalaution         Arman Filter, NP 07/30/12 2333

## 2012-07-30 NOTE — ED Notes (Addendum)
Pt came last night for same symptoms. Pt states that he was seen and given IBuprofen and a rx for percocet and he got the rx but the percocet is not working. Pt usually takes percocet for the pain at home. Pt states that the pain is between his shoulder blades and running down his arms. Pt under stress from family and mother in hospital. Pt believes the stress is increased his pain.

## 2012-07-30 NOTE — ED Notes (Signed)
Pt states pain in her upper back between shoulder blades. Pt states that the pain radiates to his right arm. Pt states worked today through pain, but was here seeing mother in ICU and decided to come down and be seen. Pt under stress at home as well.

## 2012-07-31 NOTE — ED Notes (Signed)
Pt still having pain between shoulder blades. Pt states that pain radiates to arms.

## 2012-07-31 NOTE — ED Provider Notes (Signed)
Medical screening examination/treatment/procedure(s) were performed by non-physician practitioner and as supervising physician I was immediately available for consultation/collaboration.  Doug Sou, MD 07/31/12 [phone]

## 2012-08-25 ENCOUNTER — Encounter (HOSPITAL_COMMUNITY): Payer: Self-pay | Admitting: Emergency Medicine

## 2012-08-25 ENCOUNTER — Emergency Department (HOSPITAL_COMMUNITY)
Admission: EM | Admit: 2012-08-25 | Discharge: 2012-08-25 | Disposition: A | Discharge status: Home / Self Care | Payer: Self-pay | Attending: Emergency Medicine | Admitting: Emergency Medicine

## 2012-08-25 DIAGNOSIS — Y9389 Activity, other specified: Secondary | ICD-10-CM | POA: Insufficient documentation

## 2012-08-25 DIAGNOSIS — Z8679 Personal history of other diseases of the circulatory system: Secondary | ICD-10-CM | POA: Insufficient documentation

## 2012-08-25 DIAGNOSIS — M549 Dorsalgia, unspecified: Secondary | ICD-10-CM

## 2012-08-25 DIAGNOSIS — IMO0002 Reserved for concepts with insufficient information to code with codable children: Secondary | ICD-10-CM | POA: Insufficient documentation

## 2012-08-25 DIAGNOSIS — J45909 Unspecified asthma, uncomplicated: Secondary | ICD-10-CM | POA: Insufficient documentation

## 2012-08-25 DIAGNOSIS — Z79899 Other long term (current) drug therapy: Secondary | ICD-10-CM | POA: Insufficient documentation

## 2012-08-25 DIAGNOSIS — X500XXA Overexertion from strenuous movement or load, initial encounter: Secondary | ICD-10-CM | POA: Insufficient documentation

## 2012-08-25 DIAGNOSIS — R209 Unspecified disturbances of skin sensation: Secondary | ICD-10-CM | POA: Insufficient documentation

## 2012-08-25 DIAGNOSIS — Y9289 Other specified places as the place of occurrence of the external cause: Secondary | ICD-10-CM | POA: Insufficient documentation

## 2012-08-25 DIAGNOSIS — Z87828 Personal history of other (healed) physical injury and trauma: Secondary | ICD-10-CM | POA: Insufficient documentation

## 2012-08-25 DIAGNOSIS — F17200 Nicotine dependence, unspecified, uncomplicated: Secondary | ICD-10-CM | POA: Insufficient documentation

## 2012-08-25 MED ORDER — OXYCODONE-ACETAMINOPHEN 5-325 MG PO TABS: 1.0000 | ORAL_TABLET | Freq: Four times a day (QID) | ORAL | Status: DC | PRN

## 2012-08-25 MED ORDER — DIAZEPAM 5 MG PO TABS: ORAL_TABLET | ORAL | Status: DC
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
## 2012-08-25 NOTE — ED Provider Notes (Signed)
Medical screening examination/treatment/procedure(s) were performed by non-physician practitioner and as supervising physician I was immediately available for consultation/collaboration.    Higinio Grow D Euna Armon, MD 08/25/12 2327 

## 2012-08-25 NOTE — ED Notes (Signed)
Patient lifting heavy TV and developed intense lower back pain which radiates down both legs with numbness and tingling in both lower extremities.  Patient reports he tried taking Advil but that did not help the pain.  Patient reports he has had lower back pain before but has never been seen for it.  Patient reports pain is an 8/10.

## 2012-11-12 ENCOUNTER — Emergency Department (HOSPITAL_COMMUNITY)
Admission: EM | Admit: 2012-11-12 | Discharge: 2012-11-12 | Disposition: A | Discharge status: Home / Self Care | Payer: Self-pay | Attending: Emergency Medicine | Admitting: Emergency Medicine

## 2012-11-12 ENCOUNTER — Encounter (HOSPITAL_COMMUNITY): Payer: Self-pay | Admitting: *Deleted

## 2012-11-12 ENCOUNTER — Emergency Department (HOSPITAL_COMMUNITY): Payer: Self-pay

## 2012-11-12 DIAGNOSIS — T148 Other injury of unspecified body region: Secondary | ICD-10-CM | POA: Insufficient documentation

## 2012-11-12 DIAGNOSIS — Z8679 Personal history of other diseases of the circulatory system: Secondary | ICD-10-CM | POA: Insufficient documentation

## 2012-11-12 DIAGNOSIS — Z79899 Other long term (current) drug therapy: Secondary | ICD-10-CM | POA: Insufficient documentation

## 2012-11-12 DIAGNOSIS — R109 Unspecified abdominal pain: Secondary | ICD-10-CM | POA: Insufficient documentation

## 2012-11-12 DIAGNOSIS — W57XXXA Bitten or stung by nonvenomous insect and other nonvenomous arthropods, initial encounter: Secondary | ICD-10-CM | POA: Insufficient documentation

## 2012-11-12 DIAGNOSIS — M545 Low back pain, unspecified: Secondary | ICD-10-CM | POA: Insufficient documentation

## 2012-11-12 DIAGNOSIS — Y929 Unspecified place or not applicable: Secondary | ICD-10-CM | POA: Insufficient documentation

## 2012-11-12 DIAGNOSIS — Z8709 Personal history of other diseases of the respiratory system: Secondary | ICD-10-CM | POA: Insufficient documentation

## 2012-11-12 DIAGNOSIS — Y939 Activity, unspecified: Secondary | ICD-10-CM | POA: Insufficient documentation

## 2012-11-12 DIAGNOSIS — J45909 Unspecified asthma, uncomplicated: Secondary | ICD-10-CM | POA: Insufficient documentation

## 2012-11-12 DIAGNOSIS — M549 Dorsalgia, unspecified: Secondary | ICD-10-CM

## 2012-11-12 DIAGNOSIS — F17200 Nicotine dependence, unspecified, uncomplicated: Secondary | ICD-10-CM | POA: Insufficient documentation

## 2012-11-12 LAB — CBC WITH DIFFERENTIAL/PLATELET
Hemoglobin: 16.1 g/dL (ref 13.0–17.0)
Lymphs Abs: 2.5 10*3/uL (ref 0.7–4.0)
MCH: 28.5 pg (ref 26.0–34.0)
Monocytes Relative: 6 % (ref 3–12)
Neutro Abs: 8.2 10*3/uL — ABNORMAL HIGH (ref 1.7–7.7)
Neutrophils Relative %: 70 % (ref 43–77)
RBC: 5.65 MIL/uL (ref 4.22–5.81)

## 2012-11-12 LAB — BASIC METABOLIC PANEL
BUN: 12 mg/dL (ref 6–23)
CO2: 25 mEq/L (ref 19–32)
Chloride: 102 mEq/L (ref 96–112)
Glucose, Bld: 111 mg/dL — ABNORMAL HIGH (ref 70–99)
Potassium: 3.9 mEq/L (ref 3.5–5.1)

## 2012-11-12 LAB — URINALYSIS, ROUTINE W REFLEX MICROSCOPIC
Bilirubin Urine: NEGATIVE
Hgb urine dipstick: NEGATIVE
Specific Gravity, Urine: 1.009 (ref 1.005–1.030)
pH: 6 (ref 5.0–8.0)

## 2012-11-12 LAB — URINE MICROSCOPIC-ADD ON

## 2012-11-12 MED ORDER — CYCLOBENZAPRINE HCL 10 MG PO TABS: 10.0000 mg | ORAL_TABLET | Freq: Two times a day (BID) | ORAL | Status: DC | PRN

## 2012-11-12 MED ORDER — HYDROMORPHONE HCL PF 1 MG/ML IJ SOLN
1.0000 mg | Freq: Once | INTRAMUSCULAR | Status: AC
  Administered 2012-11-12: 1 mg via INTRAVENOUS
  Filled 2012-11-12: qty 1

## 2012-11-12 MED ORDER — DOXYCYCLINE HYCLATE 100 MG PO CAPS: 100.0000 mg | ORAL_CAPSULE | Freq: Two times a day (BID) | ORAL | Status: DC

## 2012-11-12 MED ORDER — ONDANSETRON HCL 4 MG/2ML IJ SOLN
4.0000 mg | Freq: Once | INTRAMUSCULAR | Status: AC
  Administered 2012-11-12: 4 mg via INTRAVENOUS
  Filled 2012-11-12: qty 2

## 2012-11-12 MED ORDER — SODIUM CHLORIDE 0.9 % IV SOLN
INTRAVENOUS | Status: DC
  Administered 2012-11-12: 16:00:00 via INTRAVENOUS

## 2012-11-12 MED ORDER — KETOROLAC TROMETHAMINE 30 MG/ML IJ SOLN
30.0000 mg | Freq: Once | INTRAMUSCULAR | Status: AC
  Administered 2012-11-12: 30 mg via INTRAVENOUS
  Filled 2012-11-12: qty 1

## 2012-11-12 MED ORDER — TRAMADOL HCL 50 MG PO TABS: 50.0000 mg | ORAL_TABLET | Freq: Four times a day (QID) | ORAL | Status: DC | PRN

## 2012-11-12 NOTE — ED Notes (Signed)
Patient returned from CT

## 2012-11-12 NOTE — ED Notes (Signed)
Pt states that he has had multiple tick bites over the summer and is concerned that this may be cause for his nausea and pain.

## 2012-11-12 NOTE — ED Notes (Signed)
Pt states he gets nauseous after eating and sometimes has to make himself throw up in order to feel better.

## 2012-11-12 NOTE — ED Notes (Signed)
MD at bedside. Dr. Pollina at bedside.  

## 2012-11-12 NOTE — ED Notes (Addendum)
MD at bedside. Dr. Pollina at bedside.  

## 2012-11-12 NOTE — ED Notes (Signed)
Pt states 2 days ago he began having RLQ pain radiating to right flank.  Pt states yesterday he began having pain in his scrotum.  Pt states pain has become more intense since yesterday.  Pt endorses nausea, especially after eating.  Pt states he also feels bloated.  Pt states pain is worse with movement.  Pt denies fever.  Pt states he has been having loose stools.  Pt denies hematuria or pain with urination.

## 2012-11-12 NOTE — ED Notes (Signed)
Pt states he is still having pain. VSS. Dr. Blinda Leatherwood aware. No further orders received at this time.
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
## 2012-12-05 ENCOUNTER — Encounter (HOSPITAL_COMMUNITY): Payer: Self-pay

## 2012-12-05 ENCOUNTER — Emergency Department (HOSPITAL_COMMUNITY)
Admission: EM | Admit: 2012-12-05 | Discharge: 2012-12-05 | Disposition: A | Discharge status: Home / Self Care | Payer: Self-pay | Attending: Emergency Medicine | Admitting: Emergency Medicine

## 2012-12-05 ENCOUNTER — Emergency Department (HOSPITAL_COMMUNITY): Payer: Self-pay

## 2012-12-05 DIAGNOSIS — K029 Dental caries, unspecified: Secondary | ICD-10-CM | POA: Insufficient documentation

## 2012-12-05 DIAGNOSIS — J45909 Unspecified asthma, uncomplicated: Secondary | ICD-10-CM | POA: Insufficient documentation

## 2012-12-05 DIAGNOSIS — Z8719 Personal history of other diseases of the digestive system: Secondary | ICD-10-CM | POA: Insufficient documentation

## 2012-12-05 DIAGNOSIS — Z79899 Other long term (current) drug therapy: Secondary | ICD-10-CM | POA: Insufficient documentation

## 2012-12-05 DIAGNOSIS — Y92410 Unspecified street and highway as the place of occurrence of the external cause: Secondary | ICD-10-CM | POA: Insufficient documentation

## 2012-12-05 DIAGNOSIS — S0003XA Contusion of scalp, initial encounter: Secondary | ICD-10-CM | POA: Insufficient documentation

## 2012-12-05 DIAGNOSIS — F17200 Nicotine dependence, unspecified, uncomplicated: Secondary | ICD-10-CM | POA: Insufficient documentation

## 2012-12-05 DIAGNOSIS — S00532A Contusion of oral cavity, initial encounter: Secondary | ICD-10-CM

## 2012-12-05 DIAGNOSIS — Y9389 Activity, other specified: Secondary | ICD-10-CM | POA: Insufficient documentation

## 2012-12-05 MED ORDER — TRAMADOL HCL 50 MG PO TABS: 50.0000 mg | ORAL_TABLET | Freq: Four times a day (QID) | ORAL | Status: DC | PRN

## 2012-12-05 MED ORDER — OXYCODONE-ACETAMINOPHEN 5-325 MG PO TABS
2.0000 | ORAL_TABLET | Freq: Once | ORAL | Status: AC
  Administered 2012-12-05: 2 via ORAL
  Filled 2012-12-05: qty 2

## 2012-12-05 NOTE — ED Notes (Signed)
rx x 1 given for tramadol 
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
## 2012-12-05 NOTE — ED Notes (Signed)
MD notified that pain medications aren't helping- pt tearful, appears uncomfortable

## 2012-12-05 NOTE — ED Notes (Signed)
Pt given black coffee per request to see if it would help pain- ok'ed with EDNP Manus Rudd-  tearful

## 2012-12-05 NOTE — ED Notes (Signed)
Dental block by EDNP- pt reports relief

## 2012-12-05 NOTE — ED Provider Notes (Signed)
Medical screening examination/treatment/procedure(s) were performed by non-physician practitioner and as supervising physician I was immediately available for consultation/collaboration.  Taryne Kiger, MD 12/05/12 0334 

## 2012-12-05 NOTE — ED Notes (Signed)
Patient transported to X-ray 
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
## 2012-12-19 ENCOUNTER — Emergency Department (HOSPITAL_COMMUNITY): Payer: No Typology Code available for payment source

## 2012-12-19 ENCOUNTER — Emergency Department (HOSPITAL_COMMUNITY)
Admission: EM | Admit: 2012-12-19 | Discharge: 2012-12-19 | Disposition: A | Discharge status: Home / Self Care | Payer: No Typology Code available for payment source | Attending: Emergency Medicine | Admitting: Emergency Medicine

## 2012-12-19 ENCOUNTER — Encounter (HOSPITAL_COMMUNITY): Payer: Self-pay | Admitting: Emergency Medicine

## 2012-12-19 DIAGNOSIS — F17200 Nicotine dependence, unspecified, uncomplicated: Secondary | ICD-10-CM | POA: Insufficient documentation

## 2012-12-19 DIAGNOSIS — Z9889 Other specified postprocedural states: Secondary | ICD-10-CM | POA: Insufficient documentation

## 2012-12-19 DIAGNOSIS — Z8679 Personal history of other diseases of the circulatory system: Secondary | ICD-10-CM | POA: Insufficient documentation

## 2012-12-19 DIAGNOSIS — Z8709 Personal history of other diseases of the respiratory system: Secondary | ICD-10-CM | POA: Insufficient documentation

## 2012-12-19 DIAGNOSIS — J45909 Unspecified asthma, uncomplicated: Secondary | ICD-10-CM | POA: Insufficient documentation

## 2012-12-19 DIAGNOSIS — M25511 Pain in right shoulder: Secondary | ICD-10-CM

## 2012-12-19 DIAGNOSIS — M25519 Pain in unspecified shoulder: Secondary | ICD-10-CM | POA: Insufficient documentation

## 2012-12-19 DIAGNOSIS — Z79899 Other long term (current) drug therapy: Secondary | ICD-10-CM | POA: Insufficient documentation

## 2012-12-19 MED ORDER — TRAMADOL HCL 50 MG PO TABS: 50.0000 mg | ORAL_TABLET | Freq: Four times a day (QID) | ORAL | Status: DC | PRN

## 2012-12-19 MED ORDER — IBUPROFEN 800 MG PO TABS
800.0000 mg | ORAL_TABLET | Freq: Once | ORAL | Status: AC
  Administered 2012-12-19: 800 mg via ORAL
  Filled 2012-12-19: qty 1

## 2012-12-19 MED ORDER — TRAMADOL HCL 50 MG PO TABS
50.0000 mg | ORAL_TABLET | Freq: Once | ORAL | Status: AC
  Administered 2012-12-19: 50 mg via ORAL
  Filled 2012-12-19: qty 1
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
## 2013-02-06 ENCOUNTER — Emergency Department (HOSPITAL_COMMUNITY)
Admission: EM | Admit: 2013-02-06 | Discharge: 2013-02-06 | Disposition: A | Discharge status: Home / Self Care | Payer: Medicaid Other | Attending: Emergency Medicine | Admitting: Emergency Medicine

## 2013-02-06 ENCOUNTER — Emergency Department (HOSPITAL_COMMUNITY): Payer: Medicaid Other

## 2013-02-06 ENCOUNTER — Encounter (HOSPITAL_COMMUNITY): Payer: Self-pay | Admitting: Family Medicine

## 2013-02-06 DIAGNOSIS — M4800 Spinal stenosis, site unspecified: Secondary | ICD-10-CM | POA: Insufficient documentation

## 2013-02-06 DIAGNOSIS — Z9889 Other specified postprocedural states: Secondary | ICD-10-CM | POA: Insufficient documentation

## 2013-02-06 DIAGNOSIS — F17200 Nicotine dependence, unspecified, uncomplicated: Secondary | ICD-10-CM | POA: Insufficient documentation

## 2013-02-06 DIAGNOSIS — M549 Dorsalgia, unspecified: Secondary | ICD-10-CM

## 2013-02-06 DIAGNOSIS — Z79899 Other long term (current) drug therapy: Secondary | ICD-10-CM | POA: Insufficient documentation

## 2013-02-06 DIAGNOSIS — J45909 Unspecified asthma, uncomplicated: Secondary | ICD-10-CM | POA: Insufficient documentation

## 2013-02-06 DIAGNOSIS — M545 Low back pain, unspecified: Secondary | ICD-10-CM | POA: Insufficient documentation

## 2013-02-06 DIAGNOSIS — G8929 Other chronic pain: Secondary | ICD-10-CM | POA: Insufficient documentation

## 2013-02-06 DIAGNOSIS — Z8679 Personal history of other diseases of the circulatory system: Secondary | ICD-10-CM | POA: Insufficient documentation

## 2013-02-06 MED ORDER — DEXAMETHASONE SODIUM PHOSPHATE 10 MG/ML IJ SOLN
10.0000 mg | Freq: Once | INTRAMUSCULAR | Status: AC
  Administered 2013-02-06: 10 mg via INTRAVENOUS
  Filled 2013-02-06: qty 1

## 2013-02-06 MED ORDER — PROMETHAZINE HCL 25 MG PO TABS: 25.0000 mg | ORAL_TABLET | Freq: Four times a day (QID) | ORAL | Status: DC | PRN

## 2013-02-06 MED ORDER — KETOROLAC TROMETHAMINE 60 MG/2ML IM SOLN
60.0000 mg | Freq: Once | INTRAMUSCULAR | Status: AC
  Administered 2013-02-06: 60 mg via INTRAMUSCULAR
  Filled 2013-02-06: qty 2

## 2013-02-06 MED ORDER — OXYCODONE-ACETAMINOPHEN 5-325 MG PO TABS: 1.0000 | ORAL_TABLET | Freq: Four times a day (QID) | ORAL | Status: DC | PRN

## 2013-02-06 MED ORDER — HYDROMORPHONE HCL PF 1 MG/ML IJ SOLN
1.0000 mg | Freq: Once | INTRAMUSCULAR | Status: AC
  Administered 2013-02-06: 1 mg via INTRAVENOUS
  Filled 2013-02-06: qty 1

## 2013-02-06 MED ORDER — ONDANSETRON HCL 4 MG/2ML IJ SOLN
4.0000 mg | Freq: Once | INTRAMUSCULAR | Status: AC
  Administered 2013-02-06: 4 mg via INTRAVENOUS
  Filled 2013-02-06: qty 2

## 2013-02-06 MED ORDER — PREDNISONE 20 MG PO TABS: ORAL_TABLET | ORAL | Status: DC

## 2013-02-06 MED ORDER — DIAZEPAM 5 MG PO TABS
5.0000 mg | ORAL_TABLET | Freq: Once | ORAL | Status: AC
  Administered 2013-02-06: 5 mg via ORAL
  Filled 2013-02-06: qty 1

## 2013-02-06 MED ORDER — HYDROMORPHONE HCL PF 1 MG/ML IJ SOLN
1.0000 mg | Freq: Once | INTRAMUSCULAR | Status: DC
  Filled 2013-02-06: qty 1

## 2013-02-06 MED ORDER — DIPHENHYDRAMINE HCL 50 MG/ML IJ SOLN
12.5000 mg | Freq: Once | INTRAMUSCULAR | Status: AC
  Administered 2013-02-06: 12.5 mg via INTRAVENOUS
  Filled 2013-02-06: qty 1

## 2013-02-06 MED ORDER — METHOCARBAMOL 500 MG PO TABS: 1000.0000 mg | ORAL_TABLET | Freq: Four times a day (QID) | ORAL | Status: DC
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
## 2013-02-07 ENCOUNTER — Encounter (HOSPITAL_COMMUNITY): Payer: Self-pay | Admitting: Emergency Medicine

## 2013-02-07 ENCOUNTER — Emergency Department (HOSPITAL_COMMUNITY)
Admission: EM | Admit: 2013-02-07 | Discharge: 2013-02-07 | Disposition: A | Discharge status: Home / Self Care | Payer: Medicaid Other | Attending: Emergency Medicine | Admitting: Emergency Medicine

## 2013-02-07 DIAGNOSIS — Z79899 Other long term (current) drug therapy: Secondary | ICD-10-CM | POA: Insufficient documentation

## 2013-02-07 DIAGNOSIS — IMO0002 Reserved for concepts with insufficient information to code with codable children: Secondary | ICD-10-CM | POA: Insufficient documentation

## 2013-02-07 DIAGNOSIS — M549 Dorsalgia, unspecified: Secondary | ICD-10-CM

## 2013-02-07 DIAGNOSIS — Z8679 Personal history of other diseases of the circulatory system: Secondary | ICD-10-CM | POA: Insufficient documentation

## 2013-02-07 DIAGNOSIS — G8929 Other chronic pain: Secondary | ICD-10-CM | POA: Insufficient documentation

## 2013-02-07 DIAGNOSIS — J45909 Unspecified asthma, uncomplicated: Secondary | ICD-10-CM | POA: Insufficient documentation

## 2013-02-07 DIAGNOSIS — M545 Low back pain, unspecified: Secondary | ICD-10-CM | POA: Insufficient documentation

## 2013-02-07 DIAGNOSIS — F17200 Nicotine dependence, unspecified, uncomplicated: Secondary | ICD-10-CM | POA: Insufficient documentation

## 2013-02-07 DIAGNOSIS — M546 Pain in thoracic spine: Secondary | ICD-10-CM | POA: Insufficient documentation

## 2013-02-07 MED ORDER — OXYCODONE-ACETAMINOPHEN 5-325 MG PO TABS: 2.0000 | ORAL_TABLET | ORAL | Status: DC | PRN

## 2013-02-07 MED ORDER — HYDROMORPHONE HCL PF 2 MG/ML IJ SOLN
2.0000 mg | Freq: Once | INTRAMUSCULAR | Status: AC
  Administered 2013-02-07: 2 mg via INTRAMUSCULAR
  Filled 2013-02-07: qty 1
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
## 2013-02-07 NOTE — ED Provider Notes (Signed)
Medical screening examination/treatment/procedure(s) were performed by non-physician practitioner and as supervising physician I was immediately available for consultation/collaboration.  Arnitra Sokoloski R. Massai Hankerson, MD 02/07/13 2025 

## 2013-02-07 NOTE — ED Provider Notes (Signed)
Medical screening examination/treatment/procedure(s) were performed by non-physician practitioner and as supervising physician I was immediately available for consultation/collaboration.  Jaileigh Weimer R. Haileigh Pitz, MD 02/07/13 2025 
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
## 2013-02-08 NOTE — ED Provider Notes (Signed)
Medical screening examination/treatment/procedure(s) were performed by non-physician practitioner and as supervising physician I was immediately available for consultation/collaboration.   Maricarmen Braziel, MD 02/08/13 0709 

## 2013-02-19 ENCOUNTER — Encounter (HOSPITAL_COMMUNITY): Payer: Self-pay | Admitting: *Deleted

## 2013-02-19 DIAGNOSIS — M545 Low back pain, unspecified: Secondary | ICD-10-CM | POA: Insufficient documentation

## 2013-02-19 DIAGNOSIS — J45909 Unspecified asthma, uncomplicated: Secondary | ICD-10-CM | POA: Insufficient documentation

## 2013-02-19 DIAGNOSIS — M4800 Spinal stenosis, site unspecified: Secondary | ICD-10-CM | POA: Insufficient documentation

## 2013-02-19 DIAGNOSIS — F17200 Nicotine dependence, unspecified, uncomplicated: Secondary | ICD-10-CM | POA: Insufficient documentation

## 2013-02-19 DIAGNOSIS — IMO0002 Reserved for concepts with insufficient information to code with codable children: Secondary | ICD-10-CM | POA: Insufficient documentation

## 2013-02-19 DIAGNOSIS — M519 Unspecified thoracic, thoracolumbar and lumbosacral intervertebral disc disorder: Secondary | ICD-10-CM | POA: Insufficient documentation

## 2013-02-19 DIAGNOSIS — Z79899 Other long term (current) drug therapy: Secondary | ICD-10-CM | POA: Insufficient documentation

## 2013-02-19 NOTE — ED Notes (Signed)
C/o back pain radiating down both legs; history of same; mri two wks ago; referral to neurosurgeon with appt scheduled for friday

## 2013-02-20 ENCOUNTER — Emergency Department (HOSPITAL_COMMUNITY)
Admission: EM | Admit: 2013-02-20 | Discharge: 2013-02-20 | Disposition: A | Discharge status: Home / Self Care | Payer: Medicaid Other | Attending: Emergency Medicine | Admitting: Emergency Medicine

## 2013-02-20 DIAGNOSIS — M545 Low back pain: Secondary | ICD-10-CM

## 2013-02-20 MED ORDER — OXYCODONE-ACETAMINOPHEN 5-325 MG PO TABS
ORAL_TABLET | ORAL | Status: AC
  Filled 2013-02-20: qty 2

## 2013-02-20 MED ORDER — OXYCODONE-ACETAMINOPHEN 5-325 MG PO TABS: 1.0000 | ORAL_TABLET | ORAL | Status: DC | PRN

## 2013-02-20 MED ORDER — METHOCARBAMOL 500 MG PO TABS
750.0000 mg | ORAL_TABLET | Freq: Once | ORAL | Status: AC
  Administered 2013-02-20: 750 mg via ORAL
  Filled 2013-02-20 (×2): qty 1.5

## 2013-02-20 MED ORDER — METHOCARBAMOL 750 MG PO TABS: 750.0000 mg | ORAL_TABLET | Freq: Three times a day (TID) | ORAL | Status: DC

## 2013-02-20 MED ORDER — OXYCODONE-ACETAMINOPHEN 5-325 MG PO TABS
2.0000 | ORAL_TABLET | Freq: Once | ORAL | Status: AC
  Administered 2013-02-20: 2 via ORAL

## 2013-02-20 NOTE — ED Provider Notes (Addendum)
CSN: [id number]     Arrival date & time 02/19/13  2341 History   First MD Initiated Contact with Patient 02/20/13 0211     Chief Complaint  Patient presents with  . Back Pain   (Consider location/radiation/quality/duration/timing/severity/associated sxs/prior Treatment) HPI Comments: Patient with known spinal stenosis, and L4-5 lumbar disc protrusion.  Ran out of his medication today, 3, days, ago, he said, chronic, unremitting pain for the past 2, days.  He does not have an appointment with his neurosurgeon, for 2 more days. He denies any fever or dysuria.  Loss of bowel or bladder control, numbness, or decreased sensation in his  Perineal area  Patient is a 37 y.o. male presenting with back pain. The history is provided by the patient.  Back Pain Location:  Generalized Quality:  Aching Pain severity:  Severe Pain is:  Same all the time Duration:  2 days Timing:  Constant Progression:  Unchanged Chronicity:  Chronic Relieved by:  None tried Ineffective treatments:  None tried Associated symptoms: no abdominal swelling, no bladder incontinence, no bowel incontinence, no dysuria, no numbness, no paresthesias and no perianal numbness     Past Medical History  Diagnosis Date  . Asthma   . Bronchitis   . Esophageal varices    Past Surgical History  Procedure Laterality Date  . Shoulder surgery    . Rotator cuff repair  1995   Family History  Problem Relation Age of Onset  . Hypertension Other   . Cancer Other    History  Substance Use Topics  . Smoking status: Current Every Day Smoker -- 0.50 packs/day    Types: Cigarettes  . Smokeless tobacco: Not on file     Comment: 3 cigarettes a day  . Alcohol Use: No    Review of Systems  Gastrointestinal: Negative for nausea, vomiting, diarrhea, constipation and bowel incontinence.  Genitourinary: Negative for bladder incontinence, dysuria, urgency, frequency and decreased urine volume.  Musculoskeletal: Positive for back  pain.  Neurological: Negative for numbness and paresthesias.    Allergies  Morphine and related and Vicodin  Home Medications   Current Outpatient Rx  Name  Route  Sig  Dispense  Refill  . albuterol (PROVENTIL HFA;VENTOLIN HFA) 108 (90 BASE) MCG/ACT inhaler   Inhalation   Inhale 2 puffs into the lungs every 6 (six) hours as needed. Wheezing/shortness of breath.         . methocarbamol (ROBAXIN) 500 MG tablet   Oral   Take 2 tablets (1,000 mg total) by mouth 4 (four) times daily.   20 tablet   0   . oxyCODONE-acetaminophen (PERCOCET/ROXICET) 5-325 MG per tablet   Oral   Take 1-2 tablets by mouth every 6 (six) hours as needed for pain.   10 tablet   0   . methocarbamol (ROBAXIN) 750 MG tablet   Oral   Take 1 tablet (750 mg total) by mouth 3 (three) times daily.   30 tablet   0   . oxyCODONE-acetaminophen (PERCOCET/ROXICET) 5-325 MG per tablet   Oral   Take 1 tablet by mouth every 4 (four) hours as needed for pain.   15 tablet   0    BP 144/85  Pulse 105  Temp(Src) 98.6 F (37 C) (Oral)  Resp 20  SpO2 95% Physical Exam  Nursing note and vitals reviewed. Constitutional: He appears well-developed and well-nourished.  HENT:  Head: Normocephalic.  Eyes: Pupils are equal, round, and reactive to light.  Neck: Normal range of motion.  Cardiovascular: Normal rate.   Pulmonary/Chest: Effort normal.  Musculoskeletal: Normal range of motion. He exhibits tenderness.       Back:    ED Course  Procedures (including critical care time) Labs Review Labs Reviewed - No data to display Imaging Review No results found.  MDM   1. Low back pain radiating to both legs     Will give Rx for Percocet and Robaxin  Enough to last until he is seen by specialist    Arman Filter, NP 02/20/13 0317  Arman Filter, NP 03/01/13 [phone]

## 2013-02-21 NOTE — ED Provider Notes (Signed)
Medical screening examination/treatment/procedure(s) were performed by non-physician practitioner and as supervising physician I was immediately available for consultation/collaboration.  Flint Melter, MD 02/21/13 1053

## 2013-03-02 NOTE — ED Provider Notes (Signed)
Medical screening examination/treatment/procedure(s) were performed by non-physician practitioner and as supervising physician I was immediately available for consultation/collaboration.  Flint Melter, MD 03/02/13 [phone]

## 2013-03-23 ENCOUNTER — Encounter (HOSPITAL_COMMUNITY): Payer: Self-pay | Admitting: Emergency Medicine

## 2013-03-23 ENCOUNTER — Emergency Department (HOSPITAL_COMMUNITY)
Admission: EM | Admit: 2013-03-23 | Discharge: 2013-03-23 | Disposition: A | Discharge status: Home / Self Care | Payer: Medicaid Other | Attending: Emergency Medicine | Admitting: Emergency Medicine

## 2013-03-23 DIAGNOSIS — Y9389 Activity, other specified: Secondary | ICD-10-CM | POA: Insufficient documentation

## 2013-03-23 DIAGNOSIS — S4980XA Other specified injuries of shoulder and upper arm, unspecified arm, initial encounter: Secondary | ICD-10-CM | POA: Insufficient documentation

## 2013-03-23 DIAGNOSIS — S46909A Unspecified injury of unspecified muscle, fascia and tendon at shoulder and upper arm level, unspecified arm, initial encounter: Secondary | ICD-10-CM | POA: Insufficient documentation

## 2013-03-23 DIAGNOSIS — F17200 Nicotine dependence, unspecified, uncomplicated: Secondary | ICD-10-CM | POA: Insufficient documentation

## 2013-03-23 DIAGNOSIS — J45909 Unspecified asthma, uncomplicated: Secondary | ICD-10-CM | POA: Insufficient documentation

## 2013-03-23 DIAGNOSIS — Y990 Civilian activity done for income or pay: Secondary | ICD-10-CM | POA: Insufficient documentation

## 2013-03-23 DIAGNOSIS — Y929 Unspecified place or not applicable: Secondary | ICD-10-CM | POA: Insufficient documentation

## 2013-03-23 DIAGNOSIS — Z8679 Personal history of other diseases of the circulatory system: Secondary | ICD-10-CM | POA: Insufficient documentation

## 2013-03-23 DIAGNOSIS — S4992XA Unspecified injury of left shoulder and upper arm, initial encounter: Secondary | ICD-10-CM

## 2013-03-23 DIAGNOSIS — X500XXA Overexertion from strenuous movement or load, initial encounter: Secondary | ICD-10-CM | POA: Insufficient documentation

## 2013-03-23 MED ORDER — METHOCARBAMOL 500 MG PO TABS
500.0000 mg | ORAL_TABLET | Freq: Once | ORAL | Status: AC
  Administered 2013-03-23: 500 mg via ORAL
  Filled 2013-03-23: qty 1

## 2013-03-23 MED ORDER — HYDROMORPHONE HCL PF 1 MG/ML IJ SOLN
1.0000 mg | Freq: Once | INTRAMUSCULAR | Status: AC
  Administered 2013-03-23: 1 mg via INTRAMUSCULAR
  Filled 2013-03-23: qty 1

## 2013-03-23 MED ORDER — METHOCARBAMOL 500 MG PO TABS: 500.0000 mg | ORAL_TABLET | Freq: Two times a day (BID) | ORAL | Status: DC

## 2013-03-23 MED ORDER — NAPROXEN 500 MG PO TABS: 500.0000 mg | ORAL_TABLET | Freq: Two times a day (BID) | ORAL | Status: DC

## 2013-03-23 NOTE — ED Notes (Signed)
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 Pt states his hand has began to swell.

## 2013-03-23 NOTE — ED Provider Notes (Signed)
CSN: [id number]     Arrival date & time 03/23/13  1648 History  This chart was scribed for non-physician practitioner Fayrene Helper, PA-C working with Layla Maw Ward, DO by Caryn Bee, ED Scribe. This patient was seen in room WTR8/WTR8 and the patient's care was started at 5:17 PM.    Chief Complaint  Patient presents with  . Arm Pain    left   HPI HPI Comments: Chris Randall is a 37 y.o. male who presents to the Emergency Department complaining of gradual onset, severe  left arm pain onset last night after he finished lifting heavy equipment at work. Pt states his hand has begun to swell. He took 2 5 mg Percocet, prescribed here for bulging disc, today with no relief. He also tried icy hot with no relief. Pt has h/o rotator cuff surgery performed at [facility]. Denies fever, rash, cp, sob.  Denies any specific injury. Denies any numbness  Pt does not have PCP.   Past Medical History  Diagnosis Date  . Asthma   . Bronchitis   . Esophageal varices    Past Surgical History  Procedure Laterality Date  . Shoulder surgery    . Rotator cuff repair  1995   Family History  Problem Relation Age of Onset  . Hypertension Other   . Cancer Other    History  Substance Use Topics  . Smoking status: Current Every Day Smoker -- 0.50 packs/day    Types: Cigarettes  . Smokeless tobacco: Not on file     Comment: 3 cigarettes a day  . Alcohol Use: No    Review of Systems  Musculoskeletal: Positive for arthralgias (Left arm pain).  Neurological: Negative for numbness.    Allergies  Morphine and related and Vicodin  Home Medications   Current Outpatient Rx  Name  Route  Sig  Dispense  Refill  . albuterol (PROVENTIL HFA;VENTOLIN HFA) 108 (90 BASE) MCG/ACT inhaler   Inhalation   Inhale 2 puffs into the lungs every 6 (six) hours as needed. Wheezing/shortness of breath.         . oxyCODONE-acetaminophen (PERCOCET/ROXICET) 5-325 MG per tablet   Oral   Take 1-2  tablets by mouth every 6 (six) hours as needed for pain.   10 tablet   0    BP 135/83  Pulse 92  Temp(Src) 98.6 F (37 C) (Oral)  Resp 22  SpO2 99%  Physical Exam  Nursing note and vitals reviewed. Constitutional: He is oriented to person, place, and time. He appears well-developed and well-nourished. No distress.  HENT:  Head: Normocephalic and atraumatic.  Eyes: EOM are normal.  Neck: Normal range of motion. Neck supple. No tracheal deviation present.  Cardiovascular: Normal rate.   Pulmonary/Chest: Effort normal. No respiratory distress.  Musculoskeletal: Normal range of motion. He exhibits tenderness. He exhibits no edema.  Left arm brisk capillary refill to all fingers. No obvious edema noted throughout the left arm.  No obvious deformity.  Decreased grip strength compare to R. Intact bicep DTR.  Decreased L shoulder ROM to all direction to both active and passive movement.  No deformity.  Radial pulses 2+.    Neurological: He is alert and oriented to person, place, and time.  Skin: Skin is warm and dry.  Psychiatric: He has a normal mood and affect. His behavior is normal.    ED Course  Procedures (including critical care time) DIAGNOSTIC STUDIES: Oxygen Saturation is 99% on room air, normal by my interpretation.  COORDINATION OF CARE: 5:22 PM-Discussed treatment plan which includes injection of Dilaudid and muscle relaxer medications with pt at bedside and pt agreed to plan. Advised pt that he cannot be prescribed narcotic medications. Will give ortho referral to use as needed.  RICE therapy discussed.  Cannot r/o rotator cuff injury but low suspicion.  Doubt spinal cord compression.    Labs Review Labs Reviewed - No data to display Imaging Review No results found.  EKG Interpretation   None       MDM   1. Left upper arm injury, initial encounter    BP 135/83  Pulse 92  Temp(Src) 98.6 F (37 C) (Oral)  Resp 22  SpO2 99%  I personally performed the  services described in this documentation, which was scribed in my presence. The recorded information has been reviewed and is accurate.     Fayrene Helper, PA-C 03/23/13 1729

## 2013-03-23 NOTE — ED Provider Notes (Signed)
Medical screening examination/treatment/procedure(s) were performed by non-physician practitioner and as supervising physician I was immediately available for consultation/collaboration.  EKG Interpretation   None         Wavie Hashimi N Teale Goodgame, DO 03/23/13 2256 

## 2013-03-24 ENCOUNTER — Encounter (HOSPITAL_COMMUNITY): Payer: Self-pay | Admitting: Emergency Medicine

## 2013-03-24 ENCOUNTER — Emergency Department (HOSPITAL_COMMUNITY)
Admission: EM | Admit: 2013-03-24 | Discharge: 2013-03-25 | Disposition: A | Discharge status: Home / Self Care | Payer: Medicaid Other | Attending: Emergency Medicine | Admitting: Emergency Medicine

## 2013-03-24 DIAGNOSIS — Z791 Long term (current) use of non-steroidal anti-inflammatories (NSAID): Secondary | ICD-10-CM | POA: Insufficient documentation

## 2013-03-24 DIAGNOSIS — Z8719 Personal history of other diseases of the digestive system: Secondary | ICD-10-CM | POA: Insufficient documentation

## 2013-03-24 DIAGNOSIS — J45909 Unspecified asthma, uncomplicated: Secondary | ICD-10-CM | POA: Insufficient documentation

## 2013-03-24 DIAGNOSIS — M25519 Pain in unspecified shoulder: Secondary | ICD-10-CM | POA: Insufficient documentation

## 2013-03-24 DIAGNOSIS — Z79899 Other long term (current) drug therapy: Secondary | ICD-10-CM | POA: Insufficient documentation

## 2013-03-24 DIAGNOSIS — R209 Unspecified disturbances of skin sensation: Secondary | ICD-10-CM | POA: Insufficient documentation

## 2013-03-24 DIAGNOSIS — G8911 Acute pain due to trauma: Secondary | ICD-10-CM | POA: Insufficient documentation

## 2013-03-24 DIAGNOSIS — M79622 Pain in left upper arm: Secondary | ICD-10-CM

## 2013-03-24 DIAGNOSIS — F17200 Nicotine dependence, unspecified, uncomplicated: Secondary | ICD-10-CM | POA: Insufficient documentation

## 2013-03-24 DIAGNOSIS — S143XXD Injury of brachial plexus, subsequent encounter: Secondary | ICD-10-CM

## 2013-03-24 DIAGNOSIS — Z9889 Other specified postprocedural states: Secondary | ICD-10-CM | POA: Insufficient documentation

## 2013-03-24 MED ORDER — HYDROMORPHONE HCL PF 1 MG/ML IJ SOLN
1.0000 mg | Freq: Once | INTRAMUSCULAR | Status: AC
  Filled 2013-03-24: qty 1

## 2013-03-24 MED ORDER — DIPHENHYDRAMINE HCL 25 MG PO CAPS
25.0000 mg | ORAL_CAPSULE | Freq: Once | ORAL | Status: AC
  Administered 2013-03-24: 25 mg via ORAL
  Filled 2013-03-24: qty 1

## 2013-03-24 MED ORDER — HYDROMORPHONE HCL PF 1 MG/ML IJ SOLN
1.0000 mg | Freq: Once | INTRAMUSCULAR | Status: AC
  Administered 2013-03-24: 1 mg via INTRAVENOUS
  Filled 2013-03-24: qty 1

## 2013-03-24 MED ORDER — HYDROMORPHONE HCL PF 1 MG/ML IJ SOLN
1.0000 mg | Freq: Once | INTRAMUSCULAR | Status: AC
  Administered 2013-03-25: 1 mg via INTRAVENOUS

## 2013-03-24 MED ORDER — OXYCODONE-ACETAMINOPHEN 5-325 MG PO TABS: 2.0000 | ORAL_TABLET | ORAL | Status: DC | PRN

## 2013-03-24 NOTE — ED Notes (Signed)
The pt has had lt arm pain since Thursday after he  Came home from work.  He was seen at [facility] ed yesterday and was given a shot for pain and a muscle relaxer.  The pain is worse today from his lt shoulder down and his fingers on his lt hand is feeling numb.

## 2013-03-24 NOTE — ED Provider Notes (Signed)
CSN: [id number]     Arrival date & time 03/24/13  2016 History   First MD Initiated Contact with Patient 03/24/13 2207     Chief Complaint  Patient presents with  . Arm Pain   (Consider location/radiation/quality/duration/timing/severity/associated sxs/prior Treatment) Patient is a 37 y.o. male presenting with arm pain. The history is provided by the patient and medical records. No language interpreter was used.  Arm Pain Associated symptoms include arthralgias, joint swelling and myalgias. Pertinent negatives include no chills, fever, nausea, neck pain, numbness or vomiting.    PACER DORN is a 38 y.o. male  with a hx of asthma, bronchitis, esophageal varices presents to the Emergency Department complaining of acute, persistent, progressively worsening left arm pain onset 2 days ago.  Patient reports he was working at the [employer] when his partner let go of their rope and the full weight of the lights quickly pulled his left arm over his head. Patient reports immediate pain to the left axilla and anterior shoulder. He reports a history of rotator cuff tear to the right shoulder with surgical repair in states this felt similar. Pain worsened overnight and he was seen at [facility] yesterday. He reports no imaging was done but he was given Dilaudid and a sling. He was instructed to followup with Dr. Lajoyce Corners. Patient reports that today he began to experience paresthesias in extreme pain in the left arm and fingers. He reports this does not happen immediately after the accident but only began yesterday. He reports Dr. Lajoyce Corners instructed him to come here to the emergency department for an MRI.  Patient reports the swelling makes his arm pain worse and the pain medication given in helps only a small amount.  He reports associated weakness in the left arm secondary to pain and paresthesias. He denies fever, chills, headache, neck pain, chest pain, shortness of breath, syncopal  episode. Patient denies neck pain or back pain.  Past Medical History  Diagnosis Date  . Asthma   . Bronchitis   . Esophageal varices    Past Surgical History  Procedure Laterality Date  . Shoulder surgery    . Rotator cuff repair  1995   Family History  Problem Relation Age of Onset  . Hypertension Other   . Cancer Other    History  Substance Use Topics  . Smoking status: Current Every Day Smoker -- 0.50 packs/day    Types: Cigarettes  . Smokeless tobacco: Not on file     Comment: 3 cigarettes a day  . Alcohol Use: No    Review of Systems  Constitutional: Negative for fever and chills.  Gastrointestinal: Negative for nausea and vomiting.  Musculoskeletal: Positive for arthralgias, joint swelling and myalgias. Negative for back pain, neck pain and neck stiffness.  Skin: Negative for wound.  Neurological: Negative for numbness.  Hematological: Does not bruise/bleed easily.  Psychiatric/Behavioral: The patient is not nervous/anxious.   All other systems reviewed and are negative.    Allergies  Morphine and related and Vicodin  Home Medications   Current Outpatient Rx  Name  Route  Sig  Dispense  Refill  . albuterol (PROVENTIL HFA;VENTOLIN HFA) 108 (90 BASE) MCG/ACT inhaler   Inhalation   Inhale 2 puffs into the lungs every 6 (six) hours as needed. Wheezing/shortness of breath.         . methocarbamol (ROBAXIN) 500 MG tablet   Oral   Take 1 tablet (500 mg total) by mouth 2 (two) times daily.  20 tablet   0   . naproxen (NAPROSYN) 500 MG tablet   Oral   Take 1 tablet (500 mg total) by mouth 2 (two) times daily.   30 tablet   0   . oxyCODONE-acetaminophen (PERCOCET/ROXICET) 5-325 MG per tablet   Oral   Take 1-2 tablets by mouth every 6 (six) hours as needed for pain.   10 tablet   0   . oxyCODONE-acetaminophen (PERCOCET/ROXICET) 5-325 MG per tablet   Oral   Take 2 tablets by mouth every 4 (four) hours as needed for pain.   15 tablet   0    BP  150/98  Pulse 97  Temp(Src) 98.2 F (36.8 C) (Oral)  Resp 18  Wt 189 lb 1 oz (85.758 kg)  BMI 32.44 kg/m2  SpO2 98% Physical Exam  Nursing note and vitals reviewed. Constitutional: He is oriented to person, place, and time. He appears well-developed and well-nourished. No distress.  HENT:  Head: Normocephalic and atraumatic.  Eyes: Conjunctivae are normal.  Neck: Normal range of motion.  No tenderness palpation midline or paraspinal Full range of motion without pain No reproducible left arm paresthesias with axial load test  Cardiovascular: Normal rate, regular rhythm, normal heart sounds and intact distal pulses.   No murmur heard. Capillary refill < 3 sec  Pulmonary/Chest: Effort normal and breath sounds normal. No respiratory distress. He has no wheezes.  Abdominal: Soft. He exhibits no distension. There is no tenderness.  Musculoskeletal: He exhibits tenderness. He exhibits no edema.  ROM: Significantly decreased range of motion of the entire left arm secondary to pain; full range of motion of the entire right arm  Tenderness to palpation of the axilla into anterior shoulder  Lymphadenopathy:    He has no cervical adenopathy.  Neurological: He is alert and oriented to person, place, and time. Coordination normal.  Sensation patient reports altered sensation in the left arm but does feel dull and sharp; describes sensation as pain when touched - like pins and needles  Strength 3/5 strength in the left lower arm including decreased grip strength secondary to pain  Skin: Skin is warm and dry. No rash noted. He is not diaphoretic. No erythema.  No tenting of the skin  Psychiatric: He has a normal mood and affect. His behavior is normal.    ED Course  Procedures (including critical care time) Labs Review Labs Reviewed - No data to display Imaging Review No results found.  EKG Interpretation   None       MDM   1. Brachial plexus injury, left, subsequent encounter    2. Left upper arm pain      ULICES MAACK presents with left arm pain and paresthesias. Significant concern for brachial plexus injury. There is no MRI here in the emergency department tonight. I do not believe an x-ray or CT scan is going to be helpful at this point.  We'll control pain and repeat neurologic assessment of his shoulder. Will plan for outpatient MRI in the morning.  12:23 AM Pt pain controlled at this time while pt is as rest, but he is unable to tolerate further physical exam.  Pt remains with paresthesias, but no objective numbness.  Will place him back into sling.  Pt d/c with pain control and an outpatient MRI of the L shoulder has been ordered.  Pt has been instructed to return to the ED if symptoms worsen or become concerning in any way.    BP 140/85  Pulse 76  Temp(Src) 98.2 F (36.8 C) (Oral)  Resp 16  Wt 189 lb 1 oz (85.758 kg)  SpO2 99%     Dierdre Forth, PA-C 03/25/13 1555

## 2013-03-24 NOTE — ED Notes (Signed)
Seen at [facility] given robaxin and naprosyn. Pt states that fingers and hands are feeling numb and he was advised to come to ED for MRI

## 2013-03-24 NOTE — ED Notes (Signed)
States mother is a patient upstairs. Pt has swelling noted to left shoulder area with Limited ROM

## 2013-03-24 NOTE — ED Notes (Signed)
Pt states that he feels like his rotator cuff is "torn out"  States no xrays were done during previous visit.

## 2013-03-25 MED ORDER — HYDROMORPHONE HCL 2 MG PO TABS
2.0000 mg | ORAL_TABLET | Freq: Once | ORAL | Status: AC
  Administered 2013-03-25: 2 mg via ORAL
  Filled 2013-03-25: qty 1

## 2013-03-25 NOTE — ED Provider Notes (Signed)
Medical screening examination/treatment/procedure(s) were performed by non-physician practitioner and as supervising physician I was immediately available for consultation/collaboration.  EKG Interpretation   None        Ethelda Chick, MD 03/25/13 1558

## 2013-03-25 NOTE — ED Notes (Signed)
Received call from Patient. Explained that he lost his prescriptions and his AVS on cab ride home. Voiced concerns about someone filling his prescriptions under his name, and wishes for [place] to cancel his prescription. Informed patient that this RN was unaware of any way to cancel his prescription, but assured him that I would run it by the attending MD. MD Lavella Lemons informed of conversation, and MD confirmed that there is not channel by which to cancel prescription. Patient is due back to hospital @ 0800 to have MRI completed. Patient concerned about how he will manage without pain medication until he is able to see orthopedics next week. Concerns relayed to MD.

## 2013-03-28 ENCOUNTER — Other Ambulatory Visit (HOSPITAL_COMMUNITY): Payer: Self-pay | Admitting: Orthopaedic Surgery

## 2013-03-28 DIAGNOSIS — M542 Cervicalgia: Secondary | ICD-10-CM

## 2013-03-29 ENCOUNTER — Ambulatory Visit (HOSPITAL_COMMUNITY): Payer: Medicaid Other

## 2013-04-02 ENCOUNTER — Ambulatory Visit (HOSPITAL_COMMUNITY)
Admission: RE | Admit: 2013-04-02 | Discharge: 2013-04-02 | Disposition: A | Discharge status: Home / Self Care | Payer: Medicaid Other | Source: Ambulatory Visit | Attending: Orthopaedic Surgery | Admitting: Orthopaedic Surgery

## 2013-04-02 DIAGNOSIS — M47812 Spondylosis without myelopathy or radiculopathy, cervical region: Secondary | ICD-10-CM | POA: Insufficient documentation

## 2013-04-02 DIAGNOSIS — M542 Cervicalgia: Secondary | ICD-10-CM

## 2013-05-06 ENCOUNTER — Encounter (HOSPITAL_COMMUNITY): Payer: Self-pay | Admitting: Emergency Medicine

## 2013-05-06 ENCOUNTER — Emergency Department (HOSPITAL_COMMUNITY): Payer: Medicaid Other

## 2013-05-06 ENCOUNTER — Emergency Department (HOSPITAL_COMMUNITY)
Admission: EM | Admit: 2013-05-06 | Discharge: 2013-05-07 | Disposition: A | Discharge status: Home / Self Care | Payer: Medicaid Other | Attending: Emergency Medicine | Admitting: Emergency Medicine

## 2013-05-06 DIAGNOSIS — M545 Low back pain, unspecified: Secondary | ICD-10-CM | POA: Insufficient documentation

## 2013-05-06 DIAGNOSIS — R300 Dysuria: Secondary | ICD-10-CM | POA: Insufficient documentation

## 2013-05-06 DIAGNOSIS — Z79899 Other long term (current) drug therapy: Secondary | ICD-10-CM | POA: Insufficient documentation

## 2013-05-06 DIAGNOSIS — Z791 Long term (current) use of non-steroidal anti-inflammatories (NSAID): Secondary | ICD-10-CM | POA: Insufficient documentation

## 2013-05-06 DIAGNOSIS — F17200 Nicotine dependence, unspecified, uncomplicated: Secondary | ICD-10-CM | POA: Insufficient documentation

## 2013-05-06 DIAGNOSIS — J45909 Unspecified asthma, uncomplicated: Secondary | ICD-10-CM | POA: Insufficient documentation

## 2013-05-06 DIAGNOSIS — M549 Dorsalgia, unspecified: Secondary | ICD-10-CM

## 2013-05-06 DIAGNOSIS — K219 Gastro-esophageal reflux disease without esophagitis: Secondary | ICD-10-CM

## 2013-05-06 LAB — URINALYSIS, ROUTINE W REFLEX MICROSCOPIC
Bilirubin Urine: NEGATIVE
Glucose, UA: NEGATIVE mg/dL
Hgb urine dipstick: NEGATIVE
Ketones, ur: NEGATIVE mg/dL
Nitrite: NEGATIVE
Specific Gravity, Urine: 1.003 — ABNORMAL LOW (ref 1.005–1.030)
pH: 7 (ref 5.0–8.0)

## 2013-05-06 LAB — COMPREHENSIVE METABOLIC PANEL
ALT: 14 U/L (ref 0–53)
Alkaline Phosphatase: 87 U/L (ref 39–117)
BUN: 13 mg/dL (ref 6–23)
CO2: 24 mEq/L (ref 19–32)
Calcium: 9 mg/dL (ref 8.4–10.5)
Chloride: 101 mEq/L (ref 96–112)
GFR calc Af Amer: >90 mL/min (ref >90)
GFR calc non Af Amer: >90 mL/min (ref >90)
Glucose, Bld: 134 mg/dL — ABNORMAL HIGH (ref 70–99)
Potassium: 4.3 mEq/L (ref 3.5–5.1)
Sodium: 137 mEq/L (ref 135–145)
Total Bilirubin: 0.3 mg/dL (ref 0.3–1.2)
Total Protein: 6.5 g/dL (ref 6.0–8.3)

## 2013-05-06 LAB — CBC WITH DIFFERENTIAL/PLATELET
Eosinophils Absolute: 0 10*3/uL (ref 0.0–0.7)
HCT: 43.3 % (ref 39.0–52.0)
Hemoglobin: 14.9 g/dL (ref 13.0–17.0)
Lymphocytes Relative: 6 % — ABNORMAL LOW (ref 12–46)
Lymphs Abs: 1 10*3/uL (ref 0.7–4.0)
MCH: 28.7 pg (ref 26.0–34.0)
Monocytes Absolute: 1 10*3/uL (ref 0.1–1.0)
Monocytes Relative: 6 % (ref 3–12)
Neutrophils Relative %: 88 % — ABNORMAL HIGH (ref 43–77)
Platelets: 266 10*3/uL (ref 150–400)
RBC: 5.19 MIL/uL (ref 4.22–5.81)
WBC: 16.5 10*3/uL — ABNORMAL HIGH (ref 4.0–10.5)

## 2013-05-06 LAB — RAPID URINE DRUG SCREEN, HOSP PERFORMED
Barbiturates: NOT DETECTED
Opiates: NOT DETECTED
Tetrahydrocannabinol: NOT DETECTED

## 2013-05-06 LAB — CG4 I-STAT (LACTIC ACID): Lactic Acid, Venous: 2.77 mmol/L — ABNORMAL HIGH (ref 0.5–2.2)

## 2013-05-06 LAB — LIPASE, BLOOD: Lipase: 57 U/L (ref 11–59)

## 2013-05-06 MED ORDER — SODIUM CHLORIDE 0.9 % IV BOLUS (SEPSIS)
1000.0000 mL | Freq: Once | INTRAVENOUS | Status: AC
  Administered 2013-05-07: 1000 mL via INTRAVENOUS

## 2013-05-06 MED ORDER — DIAZEPAM 5 MG/ML IJ SOLN
5.0000 mg | Freq: Once | INTRAMUSCULAR | Status: AC
  Administered 2013-05-06: 5 mg via INTRAVENOUS
  Filled 2013-05-06: qty 2

## 2013-05-06 MED ORDER — KETOROLAC TROMETHAMINE 30 MG/ML IJ SOLN
30.0000 mg | Freq: Once | INTRAMUSCULAR | Status: AC
  Administered 2013-05-06: 30 mg via INTRAVENOUS
  Filled 2013-05-06: qty 1

## 2013-05-06 MED ORDER — GI COCKTAIL ~~LOC~~
30.0000 mL | Freq: Once | ORAL | Status: AC
  Administered 2013-05-06: 30 mL via ORAL
  Filled 2013-05-06: qty 30

## 2013-05-06 MED ORDER — ONDANSETRON HCL 4 MG/2ML IJ SOLN
4.0000 mg | Freq: Once | INTRAMUSCULAR | Status: AC
  Administered 2013-05-06: 4 mg via INTRAVENOUS
  Filled 2013-05-06: qty 2

## 2013-05-06 MED ORDER — HYDROMORPHONE HCL PF 1 MG/ML IJ SOLN
1.0000 mg | Freq: Once | INTRAMUSCULAR | Status: AC
  Administered 2013-05-06: 1 mg via INTRAVENOUS
  Filled 2013-05-06: qty 1

## 2013-05-06 MED ORDER — HYDROMORPHONE HCL PF 1 MG/ML IJ SOLN
1.0000 mg | Freq: Once | INTRAMUSCULAR | Status: AC
  Administered 2013-05-07: 1 mg via INTRAVENOUS
  Filled 2013-05-06: qty 1

## 2013-05-06 MED ORDER — SODIUM CHLORIDE 0.9 % IV BOLUS (SEPSIS)
1000.0000 mL | Freq: Once | INTRAVENOUS | Status: AC
  Administered 2013-05-06: 1000 mL via INTRAVENOUS

## 2013-05-06 NOTE — ED Notes (Signed)
Pt states he is having lower back pain on both sides.  Pt states the pain started last week, but "eased off" and has come back today.  Pt states he gets some releif with urination, but only for a short time period.  Pt denies history of kidney stones.

## 2013-05-06 NOTE — ED Notes (Signed)
Old and New EKG given to Dr Yao 

## 2013-05-06 NOTE — ED Provider Notes (Signed)
MSE was initiated and I personally evaluated the patient and placed orders (if any) at  8:47 PM on May 06, 2013.  Chris Randall is a 37 y/o M with PMHx of asthma, bronchitis, esophageal varices presenting to the ED with back pain that started 4 days ago. Patient reported that the pain started on the left side of his back, but has now crossed over to the right side as well. Patient reported that the pain is severe and described as if someone is "hitting" him in the back. Patient reported that he has been feeling nauseous and stated that he had two episodes of emesis yesterday. Patient reported that he thinks he has a kidney infection. Patient reported that the pain predominantly stays within his lower back. This provider reviewed the patient's file and has a long history of back pain - when asked if the pain is similar patient denies stated that the pain is worse. Stated that he is having abdominal pain described as knots in his abdomen, patient stated that he has been feeling nauseous. Stated that he has been having diarrhea as well. Reported burning with urination. Patient reported that he has been having chest pressure localized to the center of the chest with intermittent shortness of breath. Denied fever, melena, hematochezia, penile discharge, urinary and bowel incontinence.  Alert and oriented. GCS 15. Patient found sitting in bed hunched over in fetal position rocking back and forth. Mildly diaphoretic. Heart rate and rhythm normal. Pulses palpable and strong, radial and DP 2+ bilaterally. Positive CVA tenderness bilaterally. Discomfort upon palpation to mid spinal and paravertebral bilaterally-muscular. Bowel sounds are active, tenderness upon palpation to the abdomen-generalized discomfort. Patient does not meet fast-track criteria. Patient moved to main ED for further workup to be performed. Orders have been placed.  The patient appears stable so that the remainder of the MSE may be  completed by another provider.  Raymon Mutton, PA-C 05/07/13 [phone]

## 2013-05-06 NOTE — ED Provider Notes (Signed)
CSN: [id number]     Arrival date & time 05/06/13  1858 History   First MD Initiated Contact with Patient 05/06/13 1951     Chief Complaint  Patient presents with  . Back Pain   (Consider location/radiation/quality/duration/timing/severity/associated sxs/prior Treatment) The history is provided by the patient and medical records. No language interpreter was used.    MATHEWS STUHR is a 37 y.o. male  with a hx of asthma, bronchitis, esophageal varices, chronic back pain presents to the Emergency Department complaining of gradual, persistent, progressively worsening bilateral flank pain onset 4am this morning.  Associated symptoms include Dysuria, abdominal cramping, hot flashes, nausea.  Pt has not taken an OTC medication.  Nothing makes it better and nothing makes it worse.  Pt denies fever, chills, headache, neck pain, chest pain, SOB, diarrhea, weakness, dizziness, syncope.  Pt with hx a bulging disc.  Pt reports he was putting lights on a Christmas tree 24 hours prior to the onset of pain.  Pt denies lifting anything heavy.  Pt denies hematuria, loss of bowel or bladder control, saddle anesthesia, gait disturbance.     Past Medical History  Diagnosis Date  . Asthma   . Bronchitis   . Esophageal varices    Past Surgical History  Procedure Laterality Date  . Shoulder surgery    . Rotator cuff repair  1995   Family History  Problem Relation Age of Onset  . Hypertension Other   . Cancer Other    History  Substance Use Topics  . Smoking status: Current Every Day Smoker -- 0.50 packs/day    Types: Cigarettes  . Smokeless tobacco: Not on file     Comment: 3 cigarettes a day  . Alcohol Use: No    Review of Systems  Constitutional: Negative for fever and fatigue.  Respiratory: Negative for chest tightness and shortness of breath.   Cardiovascular: Negative for chest pain.  Gastrointestinal: Negative for nausea, vomiting, abdominal pain and diarrhea.  Genitourinary: Negative  for dysuria, urgency, frequency and hematuria.  Musculoskeletal: Positive for back pain and gait problem ( 2/2 pain). Negative for joint swelling, neck pain and neck stiffness.  Skin: Negative for rash.  Neurological: Negative for weakness, light-headedness, numbness and headaches.  All other systems reviewed and are negative.    Allergies  Morphine and related and Vicodin  Home Medications   Current Outpatient Rx  Name  Route  Sig  Dispense  Refill  . albuterol (PROVENTIL HFA;VENTOLIN HFA) 108 (90 BASE) MCG/ACT inhaler   Inhalation   Inhale 2 puffs into the lungs every 6 (six) hours as needed. Wheezing/shortness of breath.         . methocarbamol (ROBAXIN) 500 MG tablet   Oral   Take 1 tablet (500 mg total) by mouth 2 (two) times daily.   20 tablet   0   . naproxen (NAPROSYN) 500 MG tablet   Oral   Take 1 tablet (500 mg total) by mouth 2 (two) times daily.   30 tablet   0   . methocarbamol (ROBAXIN-750) 750 MG tablet   Oral   Take 1 tablet (750 mg total) by mouth 4 (four) times daily.   15 tablet   0   . omeprazole (PRILOSEC) 20 MG capsule   Oral   Take 1 capsule (20 mg total) by mouth daily.   30 capsule   0   . oxyCODONE-acetaminophen (PERCOCET/ROXICET) 5-325 MG per tablet   Oral   Take 1-2 tablets by mouth  every 4 (four) hours as needed for severe pain.   5 tablet   0    BP 125/73  Pulse 82  Temp(Src) 97.9 F (36.6 C) (Oral)  Resp 18  Wt 186 lb 12.8 oz (84.732 kg)  SpO2 95% Physical Exam  Nursing note and vitals reviewed. Constitutional: He is oriented to person, place, and time. He appears well-developed and well-nourished. No distress.  HENT:  Head: Normocephalic and atraumatic.  Mouth/Throat: Oropharynx is clear and moist. No oropharyngeal exudate.  Eyes: Conjunctivae are normal.  Neck: Normal range of motion. Neck supple.  Full ROM without pain  Cardiovascular: Normal rate, regular rhythm, normal heart sounds and intact distal pulses.    Pulmonary/Chest: Effort normal and breath sounds normal. No respiratory distress. He has no wheezes.  Abdominal: Soft. Bowel sounds are normal. He exhibits no distension and no mass. There is no tenderness. There is CVA tenderness (Bilateral). There is no rebound and no guarding.  Musculoskeletal: Normal range of motion. He exhibits no edema.  Full range of motion of the T-spine and L-spine No tenderness to palpation of the spinous processes of the T-spine or L-spine Tenderness to palpation of the bilateral paraspinous muscles of the T-spine and L-spine  Lymphadenopathy:    He has no cervical adenopathy.  Neurological: He is alert and oriented to person, place, and time. He has normal reflexes. He exhibits normal muscle tone. Coordination normal. GCS eye subscore is 4. GCS verbal subscore is 5. GCS motor subscore is 6.  Reflex Scores:      Tricep reflexes are 2+ on the right side and 2+ on the left side.      Bicep reflexes are 2+ on the right side and 2+ on the left side.      Brachioradialis reflexes are 2+ on the right side and 2+ on the left side.      Patellar reflexes are 2+ on the right side and 2+ on the left side.      Achilles reflexes are 2+ on the right side and 2+ on the left side. Speech is clear and goal oriented, follows commands Normal strength in upper and lower extremities bilaterally including dorsiflexion and plantar flexion, strong and equal grip strength Sensation normal to light and sharp touch Moves extremities without ataxia, coordination intact Normal gait Normal balance   Skin: Skin is warm and dry. No rash noted. He is not diaphoretic. No erythema.  Psychiatric: He has a normal mood and affect.    ED Course  Procedures (including critical care time) Labs Review Labs Reviewed  URINALYSIS, ROUTINE W REFLEX MICROSCOPIC - Abnormal; Notable for the following:    Specific Gravity, Urine 1.003 (*)    All other components within normal limits  CBC WITH  DIFFERENTIAL - Abnormal; Notable for the following:    WBC 16.5 (*)    Neutrophils Relative % 88 (*)    Neutro Abs 14.5 (*)    Lymphocytes Relative 6 (*)    All other components within normal limits  COMPREHENSIVE METABOLIC PANEL - Abnormal; Notable for the following:    Glucose, Bld 134 (*)    All other components within normal limits  CG4 I-STAT (LACTIC ACID) - Abnormal; Notable for the following:    Lactic Acid, Venous 2.77 (*)    All other components within normal limits  LIPASE, BLOOD  TROPONIN I  URINE RAPID DRUG SCREEN (HOSP PERFORMED)  CG4 I-STAT (LACTIC ACID)   Imaging Review Ct Abdomen Pelvis Wo Contrast  05/06/2013  CLINICAL DATA:  Bilateral flank and back pain.  EXAM: CT ABDOMEN AND PELVIS WITHOUT CONTRAST  TECHNIQUE: Multidetector CT imaging of the abdomen and pelvis was performed following the standard protocol without intravenous contrast.  COMPARISON:  11/12/2012  FINDINGS: No evidence of renal calculi or hydronephrosis. No evidence of ureteral calculi or dilatation. No bladder calculi identified. No evidence of perinephric fluid or inflammatory changes.  Noncontrast images of the liver, gallbladder, spleen, pancreas, and adrenal glands are also normal in appearance. No soft tissue masses or lymphadenopathy identified.  No evidence of inflammatory process or abnormal fluid collections. Normal appendix visualized. No evidence of dilated bowel loops.  IMPRESSION: No evidence of urolithiasis, hydronephrosis, or other acute findings.   Electronically Signed   By: Myles Rosenthal M.D.   On: 05/06/2013 21:33   Dg Chest 2 View  05/07/2013   CLINICAL DATA:  Persistent painful cough and chest pain for 1 week  EXAM: CHEST  2 VIEW  COMPARISON:  04/28/2012  FINDINGS: Normal heart size, mediastinal contours, and pulmonary vascularity.  Minimal chronic peribronchial thickening.  Lungs otherwise clear.  No pleural effusion or pneumothorax.  Bones unremarkable.  IMPRESSION: No acute  abnormalities.   Electronically Signed   By: Ulyses Southward M.D.   On: 05/07/2013 00:53    EKG Interpretation   None       ECG:  Date: 05/07/2013  Rate: 74  Rhythm: normal sinus rhythm  QRS Axis: normal  Intervals: normal  ST/T Wave abnormalities: normal  Conduction Disutrbances:none  Narrative Interpretation: Nonischemic ECG, unchanged from 03/26/2013  Old EKG Reviewed: none available    MDM   1. Back pain   2. GERD (gastroesophageal reflux disease)      [name] presents with a history of chronic back pain. Today's complaining of flank pain that radiates into his abdomen.  Patient with MSE at triage and nephrolithiasis workup initiated. Patient with bilateral CVA tenderness but also bilateral paraspinal tenderness of the T-spine and L-spine. UA without any evidence of hemoglobinuria, CT abdomen pelvis without shows no abnormalities.  Patient has had MRI in the past with bulging disc. He's not followed up with neurosurgery as directed.  Patient with 9 visits in the last 6 months all for pain issues and many of them for back pain. Labs pending.  12:17 AM MSE labs have returned with elevated lactic acid. Patient was given GI cocktail with resolution of abdominal pain.  Patient also with leukocytosis of 16.5. No evidence of pneumonia on physical exam or chest x-ray, CMP unremarkable, lipase within normal limits, UA without evidence of urinary tract infection.     Troponin negative and ECG nonischemic. UDS negative.  Patient's chest pain and abdominal pain resolved with his back pain after administration of Dilaudid. He angulating to the bathroom several times without difficulty and without evidence of distress or persistent pain.  CT abdomen without contrast without evidence of acute abnormality or nephrolithiasis. Highly doubt renal colic at this time. Due to patient's exam, history and negative workup today I believe that his back pain is likely chronic. His epigastric  abdominal pain and chest pain likely secondary to GERD symptoms.  Discussed at length with patient that the emergency department we'll not be able to continue to write narcotic pain medication. I referred him to have pain management, neurosurgery and given him resources as well as recommendations for primary care.  Patient is alert, oriented, nontoxic and nonseptic appearing. After one fluid bolus patient's lactic acid decreased to 1.61.  Patient exhibits no red flags for back pain. He is neurologically and hemodynamically stable; he has no focal neurologic findings.  No loss of bowel or bladder control.  No concern for cauda equina.  No fever, night sweats, weight loss, h/o cancer, IVDU.  RICE protocol and pain medicine indicated and discussed with patient.   It has been determined that no acute conditions requiring further emergency intervention are present at this time. The patient/guardian have been advised of the diagnosis and plan. We have discussed signs and symptoms that warrant return to the ED, such as changes or worsening in symptoms.   Vital signs are stable at discharge.   BP 125/73  Pulse 82  Temp(Src) 97.9 F (36.6 C) (Oral)  Resp 18  Wt 186 lb 12.8 oz (84.732 kg)  SpO2 95%  Patient/guardian has voiced understanding and agreed to follow-up with the PCP or specialist.       Dierdre Forth, PA-C 05/07/13 1610

## 2013-05-07 ENCOUNTER — Emergency Department (HOSPITAL_COMMUNITY): Payer: Medicaid Other

## 2013-05-07 LAB — CG4 I-STAT (LACTIC ACID): Lactic Acid, Venous: 1.61 mmol/L (ref 0.5–2.2)

## 2013-05-07 MED ORDER — OXYCODONE-ACETAMINOPHEN 5-325 MG PO TABS: 1.0000 | ORAL_TABLET | ORAL | Status: DC | PRN

## 2013-05-07 MED ORDER — OXYCODONE-ACETAMINOPHEN 5-325 MG PO TABS
2.0000 | ORAL_TABLET | Freq: Once | ORAL | Status: AC
  Administered 2013-05-07: 2 via ORAL
  Filled 2013-05-07: qty 2

## 2013-05-07 MED ORDER — NICOTINE 21 MG/24HR TD PT24
21.0000 mg | MEDICATED_PATCH | Freq: Every day | TRANSDERMAL | Status: DC
  Administered 2013-05-07: 21 mg via TRANSDERMAL
  Filled 2013-05-07: qty 1

## 2013-05-07 MED ORDER — OMEPRAZOLE 20 MG PO CPDR: 20.0000 mg | DELAYED_RELEASE_CAPSULE | Freq: Every day | ORAL | Status: DC

## 2013-05-07 MED ORDER — METHOCARBAMOL 750 MG PO TABS: 750.0000 mg | ORAL_TABLET | Freq: Four times a day (QID) | ORAL | Status: DC

## 2013-05-07 MED ORDER — METHOCARBAMOL 500 MG PO TABS
750.0000 mg | ORAL_TABLET | Freq: Once | ORAL | Status: AC
  Administered 2013-05-07: 750 mg via ORAL
  Filled 2013-05-07: qty 2

## 2013-05-07 NOTE — ED Notes (Signed)
Pt. Continues to c/o of pain with no relief from pain medicine, however patient up walking around room with no problems and is asking for coffee and food. PA informed. Pt. Given coffee.

## 2013-05-08 NOTE — ED Provider Notes (Signed)
Medical screening examination/treatment/procedure(s) were performed by non-physician practitioner and as supervising physician I was immediately available for consultation/collaboration.  EKG Interpretation    Date/Time:  Sunday May 06 2013 22:40:27 EST Ventricular Rate:  74 PR Interval:  137 QRS Duration: 92 QT Interval:  381 QTC Calculation: 423 R Axis:   62 Text Interpretation:  Sinus rhythm Baseline wander in lead(s) V1 ED PHYSICIAN INTERPRETATION AVAILABLE IN [facility] Confirmed by TEST, RECORD ([id number]) on 05/08/2013 7:44:53 AM              Richardean Canal, MD 05/08/13 [phone]

## 2013-05-08 NOTE — ED Provider Notes (Signed)
Medical screening examination/treatment/procedure(s) were performed by non-physician practitioner and as supervising physician I was immediately available for consultation/collaboration.  EKG Interpretation    Date/Time:  Sunday May 06 2013 22:40:27 EST Ventricular Rate:  74 PR Interval:  137 QRS Duration: 92 QT Interval:  381 QTC Calculation: 423 R Axis:   62 Text Interpretation:  Sinus rhythm Baseline wander in lead(s) V1 ED PHYSICIAN INTERPRETATION AVAILABLE IN [facility] Confirmed by TEST, RECORD ([id number]) on 05/08/2013 7:44:53 AM              Richardean Canal, MD 05/08/13 [phone]

## 2013-08-21 ENCOUNTER — Emergency Department (HOSPITAL_COMMUNITY)
Admission: EM | Admit: 2013-08-21 | Discharge: 2013-08-21 | Disposition: A | Discharge status: Home / Self Care | Payer: Medicaid Other | Attending: Emergency Medicine | Admitting: Emergency Medicine

## 2013-08-21 ENCOUNTER — Encounter (HOSPITAL_COMMUNITY): Payer: Self-pay | Admitting: Emergency Medicine

## 2013-08-21 ENCOUNTER — Emergency Department (HOSPITAL_COMMUNITY): Payer: Medicaid Other

## 2013-08-21 DIAGNOSIS — Z9889 Other specified postprocedural states: Secondary | ICD-10-CM | POA: Insufficient documentation

## 2013-08-21 DIAGNOSIS — R609 Edema, unspecified: Secondary | ICD-10-CM | POA: Insufficient documentation

## 2013-08-21 DIAGNOSIS — Z8679 Personal history of other diseases of the circulatory system: Secondary | ICD-10-CM | POA: Insufficient documentation

## 2013-08-21 DIAGNOSIS — M25519 Pain in unspecified shoulder: Secondary | ICD-10-CM | POA: Insufficient documentation

## 2013-08-21 DIAGNOSIS — F17200 Nicotine dependence, unspecified, uncomplicated: Secondary | ICD-10-CM | POA: Insufficient documentation

## 2013-08-21 DIAGNOSIS — M25511 Pain in right shoulder: Secondary | ICD-10-CM

## 2013-08-21 DIAGNOSIS — Z79899 Other long term (current) drug therapy: Secondary | ICD-10-CM | POA: Insufficient documentation

## 2013-08-21 DIAGNOSIS — R630 Anorexia: Secondary | ICD-10-CM | POA: Insufficient documentation

## 2013-08-21 DIAGNOSIS — J45909 Unspecified asthma, uncomplicated: Secondary | ICD-10-CM | POA: Insufficient documentation

## 2013-08-21 DIAGNOSIS — M542 Cervicalgia: Secondary | ICD-10-CM | POA: Insufficient documentation

## 2013-08-21 MED ORDER — DIAZEPAM 5 MG PO TABS
5.0000 mg | ORAL_TABLET | Freq: Once | ORAL | Status: AC
  Administered 2013-08-21: 5 mg via ORAL
  Filled 2013-08-21: qty 1

## 2013-08-21 MED ORDER — NAPROXEN 500 MG PO TABS: 500.0000 mg | ORAL_TABLET | Freq: Two times a day (BID) | ORAL | Status: DC

## 2013-08-21 MED ORDER — KETOROLAC TROMETHAMINE 60 MG/2ML IM SOLN
60.0000 mg | Freq: Once | INTRAMUSCULAR | Status: AC
  Administered 2013-08-21: 60 mg via INTRAMUSCULAR
  Filled 2013-08-21: qty 2

## 2013-08-21 MED ORDER — TRAMADOL HCL 50 MG PO TABS
50.0000 mg | ORAL_TABLET | Freq: Once | ORAL | Status: AC
  Administered 2013-08-21: 50 mg via ORAL
  Filled 2013-08-21: qty 1

## 2013-08-21 MED ORDER — TRAMADOL HCL 50 MG PO TABS: 50.0000 mg | ORAL_TABLET | Freq: Four times a day (QID) | ORAL | Status: DC | PRN

## 2013-08-21 NOTE — ED Provider Notes (Signed)
CSN: [id number]     Arrival date & time 08/21/13  0413 History   First MD Initiated Contact with Patient 08/21/13 0454     Chief Complaint  Patient presents with  . Neck Pain     (Consider location/radiation/quality/duration/timing/severity/associated sxs/prior Treatment) HPI Comments: 38 year old male with history of rotator cuff repair, smoker presents with right shoulder and neck pain. Patient has been working doing the remodeling and standing the past week this had gradually worsening pain. Mild radiation from neck down to upper arm. Similar to previous rotator pain however more severe. No dislocation history or acute injury. Worse with range of motion. Improved holding arm at chest wall.  Patient is a 38 y.o. male presenting with neck pain. The history is provided by the patient.  Neck Pain Associated symptoms: no chest pain, no fever, no numbness and no weakness     Past Medical History  Diagnosis Date  . Asthma   . Bronchitis   . Esophageal varices    Past Surgical History  Procedure Laterality Date  . Shoulder surgery    . Rotator cuff repair  1995   Family History  Problem Relation Age of Onset  . Hypertension Other   . Cancer Other    History  Substance Use Topics  . Smoking status: Current Every Day Smoker -- 0.50 packs/day    Types: Cigarettes  . Smokeless tobacco: Not on file     Comment: 3 cigarettes a day  . Alcohol Use: No    Review of Systems  Constitutional: Positive for appetite change. Negative for fever and chills.  Respiratory: Negative for shortness of breath.   Cardiovascular: Negative for chest pain.  Musculoskeletal: Positive for arthralgias and neck pain. Negative for joint swelling.  Neurological: Negative for weakness and numbness.      Allergies  Morphine and related and Vicodin  Home Medications   Current Outpatient Rx  Name  Route  Sig  Dispense  Refill  . albuterol (PROVENTIL HFA;VENTOLIN HFA) 108 (90 BASE) MCG/ACT inhaler  Inhalation   Inhale 2 puffs into the lungs every 6 (six) hours as needed. Wheezing/shortness of breath.         Marland Kitchen ibuprofen (ADVIL,MOTRIN) 200 MG tablet   Oral   Take 400 mg by mouth every 6 (six) hours as needed (pain).         . Menthol-Methyl Salicylate (ICY HOT BALM EXTRA STRENGTH) 7.6-29 % OINT   Apply externally   Apply 1 application topically as needed (pain).         . pantoprazole (PROTONIX) 20 MG tablet   Oral   Take 20 mg by mouth daily.          BP 149/91  Pulse 88  Temp(Src) 97.7 F (36.5 C) (Oral)  Resp 24  Ht 5\' 4"  (1.626 m)  Wt 195 lb (88.451 kg)  BMI 33.46 kg/m2  SpO2 99% Physical Exam  Nursing note and vitals reviewed. Constitutional: He is oriented to person, place, and time. He appears well-developed and well-nourished. No distress.  HENT:  Head: Normocephalic and atraumatic.  Neck: Neck supple.  Cardiovascular: Normal rate.   Musculoskeletal: He exhibits edema and tenderness.  Patient holds right arm in internal rotation for comfort. Moderate pain with attempt to externally rotate and flex about 90. Neurovascularly intact. Tender lateral and anterior deltoid. Tense trapezius muscle. No midline cervical tenderness. Supple neck.  Neurological: He is alert and oriented to person, place, and time.  Skin: Skin is warm.  ED Course  Procedures (including critical care time) Labs Review Labs Reviewed - No data to display Imaging Review Dg Shoulder Right  08/21/2013   CLINICAL DATA:  Neck pain.  Shoulder pain on the right.  EXAM: RIGHT SHOULDER - 2+ VIEW  COMPARISON:  12/19/2012  FINDINGS: Negative for fracture or dislocation. Surgical anchors present in the anterior glenoid, unchanged from previous. Normal acromioclavicular joint.  IMPRESSION: No acute osseous findings.   Electronically Signed   By: Tiburcio PeaJonathan  Watts M.D.   On: 08/21/2013 06:15     EKG Interpretation None      MDM   Final diagnoses:  Right shoulder pain  Neck pain on right  side   Concern for muscle strain/rotator tear or other. Pain medicines given in the ER. X-ray no acute findings, reviewed. Discussed followup with orthopedics.  Results and differential diagnosis were discussed with the patient. Close follow up outpatient was discussed, patient comfortable with the plan.   Filed Vitals:   08/21/13 0427  BP: 149/91  Pulse: 88  Temp: 97.7 F (36.5 C)  TempSrc: Oral  Resp: 24  Height: 5\' 4"  (1.626 m)  Weight: 195 lb (88.451 kg)  SpO2: 99%         Enid SkeensJoshua M Tiffani Kadow, MD 08/21/13 [phone]

## 2013-08-21 NOTE — Discharge Instructions (Signed)
If you were given medicines take as directed.  If you are on coumadin or contraceptives realize their levels and effectiveness is altered by many different medicines.  If you have any reaction (rash, tongues swelling, other) to the medicines stop taking and see a physician.   Please follow up as directed and return to the ER or see a physician for new or worsening symptoms.  Thank you. Filed Vitals:   08/21/13 0427  BP: 149/91  Pulse: 88  Temp: 97.7 F (36.5 C)  TempSrc: Oral  Resp: 24  Height: 5\' 4"  (1.626 m)  Weight: 195 lb (88.451 kg)  SpO2: 99%

## 2013-08-21 NOTE — ED Notes (Signed)
Pt states he has pain in the top of his neck that radiates down his neck and into his shoulder down his arm to his hand on the right side  Pt states pain started 3 days ago but it got worse tonight  Pt states he has nausea from the pain

## 2014-02-18 ENCOUNTER — Emergency Department (HOSPITAL_COMMUNITY)
Admission: EM | Admit: 2014-02-18 | Discharge: 2014-02-18 | Disposition: A | Discharge status: Home / Self Care | Payer: Medicaid Other | Attending: Emergency Medicine | Admitting: Emergency Medicine

## 2014-02-18 ENCOUNTER — Encounter (HOSPITAL_COMMUNITY): Payer: Self-pay | Admitting: Emergency Medicine

## 2014-02-18 DIAGNOSIS — Z9889 Other specified postprocedural states: Secondary | ICD-10-CM | POA: Diagnosis not present

## 2014-02-18 DIAGNOSIS — Z791 Long term (current) use of non-steroidal anti-inflammatories (NSAID): Secondary | ICD-10-CM | POA: Insufficient documentation

## 2014-02-18 DIAGNOSIS — F17200 Nicotine dependence, unspecified, uncomplicated: Secondary | ICD-10-CM | POA: Diagnosis not present

## 2014-02-18 DIAGNOSIS — M542 Cervicalgia: Secondary | ICD-10-CM | POA: Insufficient documentation

## 2014-02-18 DIAGNOSIS — Z79899 Other long term (current) drug therapy: Secondary | ICD-10-CM | POA: Diagnosis not present

## 2014-02-18 DIAGNOSIS — R52 Pain, unspecified: Secondary | ICD-10-CM | POA: Diagnosis present

## 2014-02-18 DIAGNOSIS — Z8679 Personal history of other diseases of the circulatory system: Secondary | ICD-10-CM | POA: Diagnosis not present

## 2014-02-18 DIAGNOSIS — J45909 Unspecified asthma, uncomplicated: Secondary | ICD-10-CM | POA: Diagnosis not present

## 2014-02-18 DIAGNOSIS — M79609 Pain in unspecified limb: Secondary | ICD-10-CM | POA: Insufficient documentation

## 2014-02-18 DIAGNOSIS — M79601 Pain in right arm: Secondary | ICD-10-CM

## 2014-02-18 MED ORDER — HYDROCODONE-ACETAMINOPHEN 5-325 MG PO TABS: 2.0000 | ORAL_TABLET | ORAL | Status: DC | PRN

## 2014-02-18 NOTE — Discharge Instructions (Signed)

## 2014-02-18 NOTE — ED Notes (Signed)
Pt was referred to pain mngmt center and last month they called for mandatory pill count and couldn't get in touch with pt. And pt was released from pain center. Pt has been having pain to neck and back and right arm. Pt has appt to see neurosurgeon in December. Here for pain control.

## 2014-02-18 NOTE — ED Provider Notes (Signed)
CSN: [id number]     Arrival date & time 02/18/14  1102 History   This chart was scribed for Langston Masker, PA-C, working with No att. providers found by Jolene Provost, ED Scribe. This patient was seen in room TR10C/TR10C and the patient's care was started at 12:05 AM.    Chief Complaint  Patient presents with  . Neck Pain  . Generalized Body Aches    The history is provided by the patient. No language interpreter was used.   HPI Comments: Chris Randall is a 38 y.o. male who presents to the Emergency Department complaining of chronic, burning neck and right arm pain that radiates throughout his entire body. He has taken vicodin and hydrocodone 10 mg, for pain. Pt was previously enrolled in a pain clinic and recently to have a specific pill count, however after the pain clinic was unable to reach him he was dismissed from the practice. He has seen his PCP, Dr. Morrie Sheldon for pain medication she was unable to prescribe them. He has been diagnosed with a pinched nerve in his neck by Dr. Roda Shutters and Dr. Ophelia Charter after an MRI conducted in the ED and has an appmt for surgey on December 24.     Past Medical History  Diagnosis Date  . Asthma   . Bronchitis   . Esophageal varices    Past Surgical History  Procedure Laterality Date  . Shoulder surgery    . Rotator cuff repair  1995   Family History  Problem Relation Age of Onset  . Hypertension Other   . Cancer Other    History  Substance Use Topics  . Smoking status: Current Every Day Smoker -- 0.50 packs/day    Types: Cigarettes  . Smokeless tobacco: Not on file     Comment: 3 cigarettes a day  . Alcohol Use: No    Review of Systems  Musculoskeletal: Positive for arthralgias and neck pain.  All other systems reviewed and are negative.     Allergies  Morphine and related and Vicodin  Home Medications   Prior to Admission medications   Medication Sig Start Date End Date Taking? Authorizing Provider  albuterol (PROVENTIL HFA;VENTOLIN  HFA) 108 (90 BASE) MCG/ACT inhaler Inhale 2 puffs into the lungs every 6 (six) hours as needed. Wheezing/shortness of breath.    Historical Provider, MD  ibuprofen (ADVIL,MOTRIN) 200 MG tablet Take 400 mg by mouth every 6 (six) hours as needed (pain).    Historical Provider, MD  Menthol-Methyl Salicylate (ICY HOT BALM EXTRA STRENGTH) 7.6-29 % OINT Apply 1 application topically as needed (pain).    Historical Provider, MD  naproxen (NAPROSYN) 500 MG tablet Take 1 tablet (500 mg total) by mouth 2 (two) times daily. 08/21/13   Enid Skeens, MD  pantoprazole (PROTONIX) 20 MG tablet Take 20 mg by mouth daily.    Historical Provider, MD  traMADol (ULTRAM) 50 MG tablet Take 1 tablet (50 mg total) by mouth every 6 (six) hours as needed. 08/21/13   Enid Skeens, MD   BP 136/114  Pulse 112  Temp(Src) 98.1 F (36.7 C) (Oral)  Resp 18  Ht  (1.626 m)  Wt 191 lb (86.637 kg)  BMI 32.77 kg/m2  SpO2 99% Physical Exam  Nursing note and vitals reviewed. Constitutional: He is oriented to person, place, and time. He appears well-developed and well-nourished.  HENT:  Head: Normocephalic and atraumatic.  Eyes: Conjunctivae and EOM are normal. Pupils are equal, round, and reactive to light.  Neck: Normal range of motion. Neck supple.  Cardiovascular: Normal rate, regular rhythm and normal heart sounds.   Pulmonary/Chest: Effort normal and breath sounds normal.  Abdominal: Soft. Bowel sounds are normal.  Musculoskeletal: Normal range of motion.  Neurological: He is alert and oriented to person, place, and time.  Skin: Skin is warm and dry.  Psychiatric: He has a normal mood and affect. His behavior is normal.    ED Course  Procedures (including critical care time)  DIAGNOSTIC STUDIES: Oxygen Saturation is 99% on room air, normal by my interpretation.    COORDINATION OF CARE: 12:12 AM Will prescribe a 2-3 day supply of pain medication and look through past medical records for pain medication  use history. Pt agreed to treatment plan.  Labs Review Labs Reviewed - No data to display  Imaging Review No results found.   EKG Interpretation None      MDM  Pt advised ED can not manage his pain.  I will given 20 hydrocodone until pt can obtain a new primary MD.    Final diagnoses:  Arm pain, right     I personally performed the services in this documentation, which was scribed in my presence.  The recorded information has been reviewed and considered.   Barnet Pall.  I personally performed the services in this documentation, which was scribed in my presence.  The recorded information has been reviewed and considered.   Barnet Pall.  Lonia Skinner Deputy, PA-C 02/18/14 1227

## 2014-02-18 NOTE — ED Provider Notes (Signed)
Medical screening examination/treatment/procedure(s) were performed by non-physician practitioner and as supervising physician I was immediately available for consultation/collaboration.   EKG Interpretation None        Raymir Frommelt, MD 02/18/14 1523 
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
## 2014-02-18 NOTE — ED Notes (Signed)
Declined W/C at D/C and was escorted to lobby by RN. 

## 2014-03-05 ENCOUNTER — Emergency Department (HOSPITAL_COMMUNITY)
Admission: EM | Admit: 2014-03-05 | Discharge: 2014-03-05 | Disposition: A | Discharge status: Home / Self Care | Payer: Medicaid Other | Attending: Emergency Medicine | Admitting: Emergency Medicine

## 2014-03-05 ENCOUNTER — Encounter (HOSPITAL_COMMUNITY): Payer: Self-pay | Admitting: Emergency Medicine

## 2014-03-05 DIAGNOSIS — Z791 Long term (current) use of non-steroidal anti-inflammatories (NSAID): Secondary | ICD-10-CM | POA: Insufficient documentation

## 2014-03-05 DIAGNOSIS — M545 Low back pain, unspecified: Secondary | ICD-10-CM

## 2014-03-05 DIAGNOSIS — Z8679 Personal history of other diseases of the circulatory system: Secondary | ICD-10-CM | POA: Insufficient documentation

## 2014-03-05 DIAGNOSIS — Z79899 Other long term (current) drug therapy: Secondary | ICD-10-CM | POA: Diagnosis not present

## 2014-03-05 DIAGNOSIS — Z720 Tobacco use: Secondary | ICD-10-CM | POA: Insufficient documentation

## 2014-03-05 DIAGNOSIS — J45909 Unspecified asthma, uncomplicated: Secondary | ICD-10-CM | POA: Insufficient documentation

## 2014-03-05 DIAGNOSIS — G8929 Other chronic pain: Secondary | ICD-10-CM | POA: Diagnosis not present

## 2014-03-05 MED ORDER — CYCLOBENZAPRINE HCL 10 MG PO TABS: 10.0000 mg | ORAL_TABLET | Freq: Two times a day (BID) | ORAL | Status: DC | PRN

## 2014-03-05 MED ORDER — IBUPROFEN 800 MG PO TABS: 800.0000 mg | ORAL_TABLET | Freq: Three times a day (TID) | ORAL | Status: DC

## 2014-03-05 MED ORDER — OXYCODONE-ACETAMINOPHEN 5-325 MG PO TABS
1.0000 | ORAL_TABLET | Freq: Once | ORAL | Status: AC
  Administered 2014-03-05: 1 via ORAL
  Filled 2014-03-05: qty 1

## 2014-03-05 NOTE — ED Notes (Signed)
Pt was moving a washing machine the other day and is now having mid back pain and radiating down both legs. Has just gotten worse.

## 2014-03-05 NOTE — Discharge Instructions (Signed)
Cryotherapy °Cryotherapy means treatment with cold. Ice or gel packs can be used to reduce both pain and swelling. Ice is the most helpful within the first 24 to 48 hours after an injury or flare-up from overusing a muscle or joint. Sprains, strains, spasms, burning pain, shooting pain, and aches can all be eased with ice. Ice can also be used when recovering from surgery. Ice is effective, has very few side effects, and is safe for most people to use. °PRECAUTIONS  °Ice is not a safe treatment option for people with: °· Raynaud phenomenon. This is a condition affecting small blood vessels in the extremities. Exposure to cold may cause your problems to return. °· Cold hypersensitivity. There are many forms of cold hypersensitivity, including: °¨ Cold urticaria. Red, itchy hives appear on the skin when the tissues begin to warm after being iced. °¨ Cold erythema. This is a red, itchy rash caused by exposure to cold. °¨ Cold hemoglobinuria. Red blood cells break down when the tissues begin to warm after being iced. The hemoglobin that carry oxygen are passed into the urine because they cannot combine with blood proteins fast enough. °· Numbness or altered sensitivity in the area being iced. °If you have any of the following conditions, do not use ice until you have discussed cryotherapy with your caregiver: °· Heart conditions, such as arrhythmia, angina, or chronic heart disease. °· High blood pressure. °· Healing wounds or open skin in the area being iced. °· Current infections. °· Rheumatoid arthritis. °· Poor circulation. °· Diabetes. °Ice slows the blood flow in the region it is applied. This is beneficial when trying to stop inflamed tissues from spreading irritating chemicals to surrounding tissues. However, if you expose your skin to cold temperatures for too long or without the proper protection, you can damage your skin or nerves. Watch for signs of skin damage due to cold. °HOME CARE INSTRUCTIONS °Follow  these tips to use ice and cold packs safely. °· Place a dry or damp towel between the ice and skin. A damp towel will cool the skin more quickly, so you may need to shorten the time that the ice is used. °· For a more rapid response, add gentle compression to the ice. °· Ice for no more than 10 to 20 minutes at a time. The bonier the area you are icing, the less time it will take to get the benefits of ice. °· Check your skin after 5 minutes to make sure there are no signs of a poor response to cold or skin damage. °· Rest 20 minutes or more between uses. °· Once your skin is numb, you can end your treatment. You can test numbness by very lightly touching your skin. The touch should be so light that you do not see the skin dimple from the pressure of your fingertip. When using ice, most people will feel these normal sensations in this order: cold, burning, aching, and numbness. °· Do not use ice on someone who cannot communicate their responses to pain, such as small children or people with dementia. °HOW TO MAKE AN ICE PACK °Ice packs are the most common way to use ice therapy. Other methods include ice massage, ice baths, and cryosprays. Muscle creams that cause a cold, tingly feeling do not offer the same benefits that ice offers and should not be used as a substitute unless recommended by your caregiver. °To make an ice pack, do one of the following: °· Place crushed ice or a   bag of frozen vegetables in a sealable plastic bag. Squeeze out the excess air. Place this bag inside another plastic bag. Slide the bag into a pillowcase or place a damp towel between your skin and the bag. °· Mix 3 parts water with 1 part rubbing alcohol. Freeze the mixture in a sealable plastic bag. When you remove the mixture from the freezer, it will be slushy. Squeeze out the excess air. Place this bag inside another plastic bag. Slide the bag into a pillowcase or place a damp towel between your skin and the bag. °SEEK MEDICAL CARE  IF: °· You develop white spots on your skin. This may give the skin a blotchy (mottled) appearance. °· Your skin turns blue or pale. °· Your skin becomes waxy or hard. °· Your swelling gets worse. °MAKE SURE YOU:  °· Understand these instructions. °· Will watch your condition. °· Will get help right away if you are not doing well or get worse. °Document Released: 01/04/2011 Document Revised: 09/24/2013 Document Reviewed: 01/04/2011 °[employer]® Patient Information ©2015 [employer]. This information is not intended to replace advice given to you by your health care provider. Make sure you discuss any questions you have with your health care provider. °Back Pain, Adult °Low back pain is very common. About 1 in 5 people have back pain. The cause of low back pain is rarely dangerous. The pain often gets better over time. About half of people with a sudden onset of back pain feel better in just 2 weeks. About 8 in 10 people feel better by 6 weeks.  °CAUSES °Some common causes of back pain include: °· Strain of the muscles or ligaments supporting the spine. °· Wear and tear (degeneration) of the spinal discs. °· Arthritis. °· Direct injury to the back. °DIAGNOSIS °Most of the time, the direct cause of low back pain is not known. However, back pain can be treated effectively even when the exact cause of the pain is unknown. Answering your caregiver's questions about your overall health and symptoms is one of the most accurate ways to make sure the cause of your pain is not dangerous. If your caregiver needs more information, he or she may order lab work or imaging tests (X-rays or MRIs). However, even if imaging tests show changes in your back, this usually does not require surgery. °HOME CARE INSTRUCTIONS °For many people, back pain returns. Since low back pain is rarely dangerous, it is often a condition that people can learn to manage on their own.  °· Remain active. It is stressful on the back to sit or stand in one  place. Do not sit, drive, or stand in one place for more than 30 minutes at a time. Take short walks on level surfaces as soon as pain allows. Try to increase the length of time you walk each day. °· Do not stay in bed. Resting more than 1 or 2 days can delay your recovery. °· Do not avoid exercise or work. Your body is made to move. It is not dangerous to be active, even though your back may hurt. Your back will likely heal faster if you return to being active before your pain is gone. °· Pay attention to your body when you  bend and lift. Many people have less discomfort when lifting if they bend their knees, keep the load close to their bodies, and avoid twisting. Often, the most comfortable positions are those that put less stress on your recovering back. °· Find a comfortable position to sleep. Use a firm mattress and lie on your side with your   knees slightly bent. If you lie on your back, put a pillow under your knees. °· Only take over-the-counter or prescription medicines as directed by your caregiver. Over-the-counter medicines to reduce pain and inflammation are often the most helpful. Your caregiver may prescribe muscle relaxant drugs. These medicines help dull your pain so you can more quickly return to your normal activities and healthy exercise. °· Put ice on the injured area. °· Put ice in a plastic bag. °· Place a towel between your skin and the bag. °· Leave the ice on for 15-20 minutes, 03-04 times a day for the first 2 to 3 days. After that, ice and heat may be alternated to reduce pain and spasms. °· Ask your caregiver about trying back exercises and gentle massage. This may be of some benefit. °· Avoid feeling anxious or stressed. Stress increases muscle tension and can worsen back pain. It is important to recognize when you are anxious or stressed and learn ways to manage it. Exercise is a great option. °SEEK MEDICAL CARE IF: °· You have pain that is not relieved with rest or medicine. °· You  have pain that does not improve in 1 week. °· You have new symptoms. °· You are generally not feeling well. °SEEK IMMEDIATE MEDICAL CARE IF:  °· You have pain that radiates from your back into your legs. °· You develop new bowel or bladder control problems. °· You have unusual weakness or numbness in your arms or legs. °· You develop nausea or vomiting. °· You develop abdominal pain. °· You feel faint. °Document Released: 05/10/2005 Document Revised: 11/09/2011 Document Reviewed: 09/11/2013 °[employer]® Patient Information ©2015 [employer]. This information is not intended to replace advice given to you by your health care provider. Make sure you discuss any questions you have with your health care provider. ° °

## 2014-03-05 NOTE — ED Provider Notes (Signed)
CSN: [id number]     Arrival date & time 03/05/14  1042 History  This chart was scribed for non-physician practitioner, Arnoldo HookerShari A Asiel Chrostowski, PA-C, working with Lyanne CoKevin M Campos, MD by Charline BillsEssence Howell, ED Scribe. This patient was seen in room TR10C/TR10C and the patient's care was started at 11:25 AM.   Chief Complaint  Patient presents with  . Back Pain   The history is provided by the patient. No language interpreter was used.   HPI Comments: Chris MyrtleStephen R Randall is a 38 y.o. male, with a h/o chronic back pain, who presents to the Emergency Department complaining of constant, gradually worsening mid and lower back pain that radiates down R leg over the past 2 days. Pt states that he was moving a washing machine a few days ago and immediately noted the pain after putting the machine down and standing up. He describes the pain as a constant throbbing and pins and needles sensation with walking. He reports associated numbness/tingling in R lower leg and pressure in groin and rectum that feels like he needs to have a BM. Pain is exacerbated with walking. Pt states that pain is more severe than typical back pain. He denies seeing a pain clinic. Pt states that he has not been able to sleep in 3 days due to severity of pain. He denies HA, chest pain, SOB, abdominal pain, dysuria. No h/o back surgery. Pt states that he was supposed to follow-up with a neurosurgeon but needs a new MRI; last MRI 02/2013.   Past Medical History  Diagnosis Date  . Asthma   . Bronchitis   . Esophageal varices    Past Surgical History  Procedure Laterality Date  . Shoulder surgery    . Rotator cuff repair  1995   Family History  Problem Relation Age of Onset  . Hypertension Other   . Cancer Other    History  Substance Use Topics  . Smoking status: Current Every Day Smoker -- 0.50 packs/day    Types: Cigarettes  . Smokeless tobacco: Not on file     Comment: 3 cigarettes a day  . Alcohol Use: No    Review of Systems   Respiratory: Negative for shortness of breath.   Cardiovascular: Negative for chest pain.  Gastrointestinal: Negative for abdominal pain.  Genitourinary: Negative for dysuria.  Musculoskeletal: Positive for back pain.  Neurological: Positive for numbness. Negative for headaches.   Allergies  Morphine and related and Vicodin  Home Medications   Prior to Admission medications   Medication Sig Start Date End Date Taking? Authorizing Provider  albuterol (PROVENTIL HFA;VENTOLIN HFA) 108 (90 BASE) MCG/ACT inhaler Inhale 2 puffs into the lungs every 6 (six) hours as needed. Wheezing/shortness of breath.   Yes Historical Provider, MD  albuterol-ipratropium (COMBIVENT) 18-103 MCG/ACT inhaler Inhale 1 puff into the lungs every 4 (four) hours.   Yes Historical Provider, MD  HYDROcodone-acetaminophen (NORCO/VICODIN) 5-325 MG per tablet Take 2 tablets by mouth every 4 (four) hours as needed. 02/18/14  Yes Lonia SkinnerLeslie K Sofia, PA-C  ibuprofen (ADVIL,MOTRIN) 200 MG tablet Take 400 mg by mouth every 6 (six) hours as needed (pain).   Yes Historical Provider, MD  Menthol-Methyl Salicylate (ICY HOT BALM EXTRA STRENGTH) 7.6-29 % OINT Apply 1 application topically as needed (pain).   Yes Historical Provider, MD  naproxen (NAPROSYN) 500 MG tablet Take 1 tablet (500 mg total) by mouth 2 (two) times daily. 08/21/13  Yes Enid SkeensJoshua M Zavitz, MD  pantoprazole (PROTONIX) 20 MG tablet Take 20  mg by mouth daily.   Yes Historical Provider, MD  traMADol (ULTRAM) 50 MG tablet Take 1 tablet (50 mg total) by mouth every 6 (six) hours as needed. 08/21/13  Yes Enid SkeensJoshua M Zavitz, MD   Triage Vitals: BP 128/83  Pulse 88  Temp(Src) 98.4 F (36.9 C) (Oral)  Resp 18  SpO2 99% Physical Exam  Nursing note and vitals reviewed. Constitutional: He is oriented to person, place, and time. He appears well-developed and well-nourished.  HENT:  Head: Normocephalic and atraumatic.  Eyes: Conjunctivae and EOM are normal.  Neck: Normal range of  motion. Neck supple.  Cardiovascular: Normal rate and regular rhythm.   Pulmonary/Chest: Effort normal and breath sounds normal. He has no wheezes. He has no rales.  Musculoskeletal: Normal range of motion.  R lumbar paraspinal tenderness to palpation  No cervical or thoracic spinous process tenderness  Neurological: He is alert and oriented to person, place, and time. He exhibits normal muscle tone. Coordination normal.  Ambulatory and fully weight bearing.   Skin: Skin is warm and dry.  Psychiatric: He has a normal mood and affect. His behavior is normal.   ED Course  Procedures (including critical care time) DIAGNOSTIC STUDIES: Oxygen Saturation is 99% on RA, normal by my interpretation.    COORDINATION OF CARE: 11:35 AM-Discussed treatment plan with pt at bedside and pt agreed to plan.   Labs Review Labs Reviewed - No data to display  Imaging Review No results found.   EKG Interpretation None      MDM   Final diagnoses:  None    1. Chronic pain 2. Low back pain, acute, recurrent  No neurologic deficits on exam. The patient ihas minimal objective findings and a history of chronic pain, currently without Pain Mgmt [facility] [place]. Database consulted. He is prescribed narcotics from multiple providers [facility] Discussed that we could not manage chronic pain issues from the emergency department. Encouraged outpatient follow up - resource guide provided.    I personally performed the services described in this documentation, which was scribed in my presence. The recorded information has been reviewed and is accurate.    Arnoldo HookerShari A Marshal Eskew, PA-C 03/05/14 1156

## 2014-03-06 NOTE — ED Provider Notes (Signed)
Medical screening examination/treatment/procedure(s) were performed by non-physician practitioner and as supervising physician I was immediately available for consultation/collaboration.   EKG Interpretation None        Lyanne CoKevin M Mystic Labo, MD 03/06/14 1941

## 2014-03-09 ENCOUNTER — Emergency Department (HOSPITAL_COMMUNITY): Payer: Medicaid Other

## 2014-03-09 ENCOUNTER — Encounter (HOSPITAL_COMMUNITY): Payer: Self-pay | Admitting: Emergency Medicine

## 2014-03-09 ENCOUNTER — Emergency Department (HOSPITAL_COMMUNITY)
Admission: EM | Admit: 2014-03-09 | Discharge: 2014-03-09 | Disposition: A | Discharge status: Home / Self Care | Payer: Medicaid Other | Attending: Emergency Medicine | Admitting: Emergency Medicine

## 2014-03-09 DIAGNOSIS — M545 Low back pain, unspecified: Secondary | ICD-10-CM

## 2014-03-09 DIAGNOSIS — Z791 Long term (current) use of non-steroidal anti-inflammatories (NSAID): Secondary | ICD-10-CM | POA: Diagnosis not present

## 2014-03-09 DIAGNOSIS — S3992XA Unspecified injury of lower back, initial encounter: Secondary | ICD-10-CM | POA: Diagnosis not present

## 2014-03-09 DIAGNOSIS — J45909 Unspecified asthma, uncomplicated: Secondary | ICD-10-CM | POA: Insufficient documentation

## 2014-03-09 DIAGNOSIS — Z79899 Other long term (current) drug therapy: Secondary | ICD-10-CM | POA: Insufficient documentation

## 2014-03-09 DIAGNOSIS — Z8679 Personal history of other diseases of the circulatory system: Secondary | ICD-10-CM | POA: Diagnosis not present

## 2014-03-09 DIAGNOSIS — Y92410 Unspecified street and highway as the place of occurrence of the external cause: Secondary | ICD-10-CM | POA: Diagnosis not present

## 2014-03-09 DIAGNOSIS — R32 Unspecified urinary incontinence: Secondary | ICD-10-CM | POA: Insufficient documentation

## 2014-03-09 DIAGNOSIS — S139XXA Sprain of joints and ligaments of unspecified parts of neck, initial encounter: Secondary | ICD-10-CM

## 2014-03-09 DIAGNOSIS — S134XXA Sprain of ligaments of cervical spine, initial encounter: Secondary | ICD-10-CM | POA: Insufficient documentation

## 2014-03-09 DIAGNOSIS — S199XXA Unspecified injury of neck, initial encounter: Secondary | ICD-10-CM | POA: Diagnosis present

## 2014-03-09 DIAGNOSIS — Z720 Tobacco use: Secondary | ICD-10-CM | POA: Insufficient documentation

## 2014-03-09 DIAGNOSIS — Y9389 Activity, other specified: Secondary | ICD-10-CM | POA: Diagnosis not present

## 2014-03-09 MED ORDER — HYDROMORPHONE HCL 1 MG/ML IJ SOLN
1.0000 mg | Freq: Once | INTRAMUSCULAR | Status: AC
  Administered 2014-03-09: 1 mg via INTRAVENOUS
  Filled 2014-03-09: qty 1

## 2014-03-09 MED ORDER — PREDNISONE 20 MG PO TABS: ORAL_TABLET | ORAL | Status: DC

## 2014-03-09 MED ORDER — IPRATROPIUM-ALBUTEROL 18-103 MCG/ACT IN AERO: 1.0000 | INHALATION_SPRAY | RESPIRATORY_TRACT | Status: DC

## 2014-03-09 MED ORDER — ALBUTEROL SULFATE HFA 108 (90 BASE) MCG/ACT IN AERS
2.0000 | INHALATION_SPRAY | Freq: Once | RESPIRATORY_TRACT | Status: AC
  Administered 2014-03-09: 2 via RESPIRATORY_TRACT
  Filled 2014-03-09: qty 6.7

## 2014-03-09 MED ORDER — OXYCODONE-ACETAMINOPHEN 5-325 MG PO TABS
2.0000 | ORAL_TABLET | ORAL | Status: DC | PRN
Start: 2014-03-09 — End: 2014-04-04

## 2014-03-09 NOTE — ED Notes (Signed)
Bed: [id number] Expected date:  Expected time:  Means of arrival:  Comments: Hold for triage 8
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
## 2014-03-09 NOTE — ED Notes (Signed)
Pt requesting pain meds; informed pt that I would bring it to the Drs attention ASAP.

## 2014-03-09 NOTE — ED Notes (Addendum)
No orders have been placed on patient. Spoke to [place] PA about plan of care for patient.  Ben PA in formed that he is unable to see patient in fast track area and he is no longer patient's provider.   Spoke with [place] PA about patient. No new orders at this time.
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
## 2014-03-09 NOTE — ED Notes (Signed)
MRI delay d/t ensuring pt had no metal in his body from previous rotator cuff surgery.  Dr. Micheline Mazeocherty reviewed old records and established that pt does NOT have metal in his body and is safe to proceed w/ MRI.

## 2014-04-04 ENCOUNTER — Emergency Department (HOSPITAL_COMMUNITY)
Admission: EM | Admit: 2014-04-04 | Discharge: 2014-04-04 | Discharge status: Against Medical Advice | Payer: Medicaid Other | Attending: Emergency Medicine | Admitting: Emergency Medicine

## 2014-04-04 ENCOUNTER — Encounter (HOSPITAL_COMMUNITY): Payer: Self-pay

## 2014-04-04 DIAGNOSIS — M5440 Lumbago with sciatica, unspecified side: Secondary | ICD-10-CM | POA: Diagnosis not present

## 2014-04-04 DIAGNOSIS — Z7982 Long term (current) use of aspirin: Secondary | ICD-10-CM | POA: Insufficient documentation

## 2014-04-04 DIAGNOSIS — Z720 Tobacco use: Secondary | ICD-10-CM | POA: Insufficient documentation

## 2014-04-04 DIAGNOSIS — F1310 Sedative, hypnotic or anxiolytic abuse, uncomplicated: Secondary | ICD-10-CM | POA: Insufficient documentation

## 2014-04-04 DIAGNOSIS — Z791 Long term (current) use of non-steroidal anti-inflammatories (NSAID): Secondary | ICD-10-CM | POA: Diagnosis not present

## 2014-04-04 DIAGNOSIS — Z8679 Personal history of other diseases of the circulatory system: Secondary | ICD-10-CM | POA: Diagnosis not present

## 2014-04-04 DIAGNOSIS — F1410 Cocaine abuse, uncomplicated: Secondary | ICD-10-CM | POA: Insufficient documentation

## 2014-04-04 DIAGNOSIS — M5441 Lumbago with sciatica, right side: Secondary | ICD-10-CM

## 2014-04-04 DIAGNOSIS — J45909 Unspecified asthma, uncomplicated: Secondary | ICD-10-CM | POA: Insufficient documentation

## 2014-04-04 DIAGNOSIS — M545 Low back pain: Secondary | ICD-10-CM | POA: Diagnosis present

## 2014-04-04 DIAGNOSIS — Z79899 Other long term (current) drug therapy: Secondary | ICD-10-CM | POA: Diagnosis not present

## 2014-04-04 DIAGNOSIS — M5442 Lumbago with sciatica, left side: Secondary | ICD-10-CM

## 2014-04-04 LAB — URINALYSIS, ROUTINE W REFLEX MICROSCOPIC
Bilirubin Urine: NEGATIVE
Glucose, UA: NEGATIVE mg/dL
Hgb urine dipstick: NEGATIVE
Ketones, ur: NEGATIVE mg/dL
LEUKOCYTES UA: NEGATIVE
NITRITE: NEGATIVE
Protein, ur: NEGATIVE mg/dL
SPECIFIC GRAVITY, URINE: 1.021 (ref 1.005–1.030)
Urobilinogen, UA: 0.2 mg/dL (ref 0.0–1.0)
pH: 7 (ref 5.0–8.0)

## 2014-04-04 LAB — I-STAT CHEM 8, ED
BUN: 16 mg/dL (ref 6–23)
CHLORIDE: 105 meq/L (ref 96–112)
Calcium, Ion: 1.14 mmol/L (ref 1.12–1.23)
Creatinine, Ser: 0.9 mg/dL (ref 0.50–1.35)
Glucose, Bld: 90 mg/dL (ref 70–99)
HEMATOCRIT: 54 % — AB (ref 39.0–52.0)
Hemoglobin: 18.4 g/dL — ABNORMAL HIGH (ref 13.0–17.0)
POTASSIUM: 5.1 meq/L (ref 3.7–5.3)
SODIUM: 137 meq/L (ref 137–147)
TCO2: 28 mmol/L (ref 0–100)

## 2014-04-04 LAB — RAPID URINE DRUG SCREEN, HOSP PERFORMED
Amphetamines: NOT DETECTED
Barbiturates: NOT DETECTED
Benzodiazepines: POSITIVE — AB
COCAINE: POSITIVE — AB
Opiates: NOT DETECTED
Tetrahydrocannabinol: NOT DETECTED

## 2014-04-04 MED ORDER — KETOROLAC TROMETHAMINE 60 MG/2ML IM SOLN
30.0000 mg | Freq: Once | INTRAMUSCULAR | Status: AC
  Administered 2014-04-04: 30 mg via INTRAMUSCULAR
  Filled 2014-04-04: qty 2

## 2014-04-04 MED ORDER — METHYLPREDNISOLONE SODIUM SUCC 125 MG IJ SOLR: 125.0000 mg | Freq: Once | INTRAMUSCULAR | Status: DC

## 2014-04-04 MED ORDER — PREDNISONE 20 MG PO TABS
60.0000 mg | ORAL_TABLET | Freq: Once | ORAL | Status: AC
  Administered 2014-04-04: 60 mg via ORAL
  Filled 2014-04-04: qty 3

## 2014-04-04 MED ORDER — OXYCODONE-ACETAMINOPHEN 5-325 MG PO TABS: 1.0000 | ORAL_TABLET | Freq: Once | ORAL | Status: DC

## 2014-04-04 MED ORDER — SODIUM CHLORIDE 0.9 % IV SOLN: INTRAVENOUS | Status: DC
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
## 2014-04-04 NOTE — ED Notes (Signed)
Upon performing straight cath, urine noted to be yellow EDP made aware Patient able to ambulate to bathroom without issue

## 2014-04-04 NOTE — ED Notes (Signed)
Pt left AMA because he didn't get narcotics.

## 2014-04-04 NOTE — ED Notes (Signed)
Upon return from bathroom, patient pressed call bell for assistance Patient stated, "I need some fucking pain medicine!" when asked how he could be helped EDP made aware
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
## 2014-04-04 NOTE — ED Notes (Signed)
Patient asked to provide urine specimen when he walked to the bathroom Specimen cup at bedside has water inside, instead of urine PA at bedside and is aware Per PA, patient is to have in and out cath done for urine

## 2014-04-04 NOTE — ED Notes (Signed)
Patient ambulated to bathroom independently and without assistance
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
## 2014-05-03 ENCOUNTER — Emergency Department (HOSPITAL_COMMUNITY): Payer: Medicaid Other

## 2014-05-03 ENCOUNTER — Emergency Department (HOSPITAL_COMMUNITY)
Admission: EM | Admit: 2014-05-03 | Discharge: 2014-05-03 | Disposition: A | Discharge status: Home / Self Care | Payer: Medicaid Other | Attending: Emergency Medicine | Admitting: Emergency Medicine

## 2014-05-03 ENCOUNTER — Encounter (HOSPITAL_COMMUNITY): Payer: Self-pay | Admitting: *Deleted

## 2014-05-03 DIAGNOSIS — Y998 Other external cause status: Secondary | ICD-10-CM | POA: Diagnosis not present

## 2014-05-03 DIAGNOSIS — M542 Cervicalgia: Secondary | ICD-10-CM

## 2014-05-03 DIAGNOSIS — Z79899 Other long term (current) drug therapy: Secondary | ICD-10-CM | POA: Insufficient documentation

## 2014-05-03 DIAGNOSIS — Z720 Tobacco use: Secondary | ICD-10-CM | POA: Diagnosis not present

## 2014-05-03 DIAGNOSIS — Y9389 Activity, other specified: Secondary | ICD-10-CM | POA: Diagnosis not present

## 2014-05-03 DIAGNOSIS — G8929 Other chronic pain: Secondary | ICD-10-CM | POA: Insufficient documentation

## 2014-05-03 DIAGNOSIS — S199XXA Unspecified injury of neck, initial encounter: Secondary | ICD-10-CM | POA: Diagnosis not present

## 2014-05-03 DIAGNOSIS — S3992XA Unspecified injury of lower back, initial encounter: Secondary | ICD-10-CM | POA: Diagnosis not present

## 2014-05-03 DIAGNOSIS — Y9289 Other specified places as the place of occurrence of the external cause: Secondary | ICD-10-CM | POA: Insufficient documentation

## 2014-05-03 DIAGNOSIS — J45909 Unspecified asthma, uncomplicated: Secondary | ICD-10-CM | POA: Diagnosis not present

## 2014-05-03 DIAGNOSIS — S4991XA Unspecified injury of right shoulder and upper arm, initial encounter: Secondary | ICD-10-CM | POA: Diagnosis not present

## 2014-05-03 MED ORDER — OXYCODONE-ACETAMINOPHEN 5-325 MG PO TABS
2.0000 | ORAL_TABLET | Freq: Once | ORAL | Status: AC
  Administered 2014-05-03: 2 via ORAL
  Filled 2014-05-03: qty 2

## 2014-05-03 MED ORDER — CYCLOBENZAPRINE HCL 10 MG PO TABS: 10.0000 mg | ORAL_TABLET | Freq: Two times a day (BID) | ORAL | Status: DC | PRN

## 2014-05-03 NOTE — Discharge Instructions (Signed)
Return to the emergency room with worsening of symptoms, new symptoms or with symptoms that are concerning, especially numbness, tingling, weakness, fevers, redness, swelling, red streaks. RICE: Rest, Ice (three cycles of 20 mins on, 20mins off at least twice a day), compression/brace, elevation. Heating pad works well for back pain. Ibuprofen 400mg  (2 tablets 200mg ) every 5-6 hours for 3-5 days and then as needed for pain. Flexeril for severe pain. Do not operate machinery, drive or drink alcohol while taking narcotics or muscle relaxers. Call to make an appointment with orthopedics physician. Number provided above.   Cervical Sprain A cervical sprain is an injury in the neck in which the strong, fibrous tissues (ligaments) that connect your neck bones stretch or tear. Cervical sprains can range from mild to severe. Severe cervical sprains can cause the neck vertebrae to be unstable. This can lead to damage of the spinal cord and can result in serious nervous system problems. The amount of time it takes for a cervical sprain to get better depends on the cause and extent of the injury. Most cervical sprains heal in 1 to 3 weeks. CAUSES  Severe cervical sprains may be caused by:   Contact sport injuries (such as from football, rugby, wrestling, hockey, auto racing, gymnastics, diving, martial arts, or boxing).   Motor vehicle collisions.   Whiplash injuries. This is an injury from a sudden forward and backward whipping movement of the head and neck.  Falls.  Mild cervical sprains may be caused by:   Being in an awkward position, such as while cradling a telephone between your ear and shoulder.   Sitting in a chair that does not offer proper support.   Working at a poorly Marketing executivedesigned computer station.   Looking up or down for long periods of time.  SYMPTOMS   Pain, soreness, stiffness, or a burning sensation in the front, back, or sides of the neck. This discomfort may develop  immediately after the injury or slowly, 24 hours or more after the injury.   Pain or tenderness directly in the middle of the back of the neck.   Shoulder or upper back pain.   Limited ability to move the neck.   Headache.   Dizziness.   Weakness, numbness, or tingling in the hands or arms.   Muscle spasms.   Difficulty swallowing or chewing.   Tenderness and swelling of the neck.  DIAGNOSIS  Most of the time your health care provider can diagnose a cervical sprain by taking your history and doing a physical exam. Your health care provider will ask about previous neck injuries and any known neck problems, such as arthritis in the neck. X-rays may be taken to find out if there are any other problems, such as with the bones of the neck. Other tests, such as a CT scan or MRI, may also be needed.  TREATMENT  Treatment depends on the severity of the cervical sprain. Mild sprains can be treated with rest, keeping the neck in place (immobilization), and pain medicines. Severe cervical sprains are immediately immobilized. Further treatment is done to help with pain, muscle spasms, and other symptoms and may include:  Medicines, such as pain relievers, numbing medicines, or muscle relaxants.   Physical therapy. This may involve stretching exercises, strengthening exercises, and posture training. Exercises and improved posture can help stabilize the neck, strengthen muscles, and help stop symptoms from returning.  HOME CARE INSTRUCTIONS   Put ice on the injured area.   Put ice in a  plastic bag.   Place a towel between your skin and the bag.   Leave the ice on for 15-20 minutes, 3-4 times a day.   If your injury was severe, you may have been given a cervical collar to wear. A cervical collar is a two-piece collar designed to keep your neck from moving while it heals.  Do not remove the collar unless instructed by your health care provider.  If you have long hair, keep it  outside of the collar.  Ask your health care provider before making any adjustments to your collar. Minor adjustments may be required over time to improve comfort and reduce pressure on your chin or on the back of your head.  Ifyou are allowed to remove the collar for cleaning or bathing, follow your health care provider's instructions on how to do so safely.  Keep your collar clean by wiping it with mild soap and water and drying it completely. If the collar you have been given includes removable pads, remove them every 1-2 days and hand wash them with soap and water. Allow them to air dry. They should be completely dry before you wear them in the collar.  If you are allowed to remove the collar for cleaning and bathing, wash and dry the skin of your neck. Check your skin for irritation or sores. If you see any, tell your health care provider.  Do not drive while wearing the collar.   Only take over-the-counter or prescription medicines for pain, discomfort, or fever as directed by your health care provider.   Keep all follow-up appointments as directed by your health care provider.   Keep all physical therapy appointments as directed by your health care provider.   Make any needed adjustments to your workstation to promote good posture.   Avoid positions and activities that make your symptoms worse.   Warm up and stretch before being active to help prevent problems.  SEEK MEDICAL CARE IF:   Your pain is not controlled with medicine.   You are unable to decrease your pain medicine over time as planned.   Your activity level is not improving as expected.  SEEK IMMEDIATE MEDICAL CARE IF:   You develop any bleeding.  You develop stomach upset.  You have signs of an allergic reaction to your medicine.   Your symptoms get worse.   You develop new, unexplained symptoms.   You have numbness, tingling, weakness, or paralysis in any part of your body.  MAKE SURE YOU:    Understand these instructions.  Will watch your condition.  Will get help right away if you are not doing well or get worse. Document Released: 03/07/2007 Document Revised: 05/15/2013 Document Reviewed: 11/15/2012 [facility] Patient Information 2015 [place], [place]. This information is not intended to replace advice given to you by your health care provider. Make sure you discuss any questions you have with your health care provider.
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
## 2014-06-08 ENCOUNTER — Encounter (HOSPITAL_COMMUNITY): Payer: Self-pay | Admitting: Cardiology

## 2014-06-08 ENCOUNTER — Emergency Department (HOSPITAL_COMMUNITY)
Admission: EM | Admit: 2014-06-08 | Discharge: 2014-06-08 | Disposition: A | Discharge status: Home / Self Care | Payer: No Typology Code available for payment source | Attending: Emergency Medicine | Admitting: Emergency Medicine

## 2014-06-08 DIAGNOSIS — Z7982 Long term (current) use of aspirin: Secondary | ICD-10-CM | POA: Insufficient documentation

## 2014-06-08 DIAGNOSIS — Z87891 Personal history of nicotine dependence: Secondary | ICD-10-CM | POA: Insufficient documentation

## 2014-06-08 DIAGNOSIS — R200 Anesthesia of skin: Secondary | ICD-10-CM | POA: Insufficient documentation

## 2014-06-08 DIAGNOSIS — M5416 Radiculopathy, lumbar region: Secondary | ICD-10-CM | POA: Insufficient documentation

## 2014-06-08 DIAGNOSIS — Z8679 Personal history of other diseases of the circulatory system: Secondary | ICD-10-CM | POA: Insufficient documentation

## 2014-06-08 DIAGNOSIS — M5410 Radiculopathy, site unspecified: Secondary | ICD-10-CM

## 2014-06-08 DIAGNOSIS — Z79899 Other long term (current) drug therapy: Secondary | ICD-10-CM | POA: Insufficient documentation

## 2014-06-08 DIAGNOSIS — J45909 Unspecified asthma, uncomplicated: Secondary | ICD-10-CM | POA: Insufficient documentation

## 2014-06-08 MED ORDER — TRAMADOL HCL 50 MG PO TABS: 50.0000 mg | ORAL_TABLET | Freq: Four times a day (QID) | ORAL | Status: DC | PRN

## 2014-06-08 MED ORDER — METHOCARBAMOL 500 MG PO TABS: 500.0000 mg | ORAL_TABLET | Freq: Two times a day (BID) | ORAL | Status: DC

## 2014-06-08 MED ORDER — PREDNISONE 20 MG PO TABS: ORAL_TABLET | ORAL | Status: DC

## 2014-06-08 NOTE — ED Notes (Signed)
Pt reports that he was helping move a dresser and at fell. Pt reports that he caught it and is now having lower back pain.

## 2014-06-08 NOTE — ED Notes (Addendum)
PT reports burning pain from neck to base of spine. Pt states he feels like needs to have a BM. Pt also reports bil. Groin pain and pain down posterior legs.

## 2014-06-08 NOTE — ED Provider Notes (Signed)
CSN: [id number]     Arrival date & time 06/08/14  1053 History  This chart was scribed for Chris Helper, PA-C, working with Chris Randall, * by Chestine Spore, ED Scribe. The patient was seen in room TR06C/TR06C at 11:42 AM.    Chief Complaint  Patient presents with  . Back Pain     The history is provided by the patient. No language interpreter was used.    HPI Comments: Chris Randall is a 39 y.o. male with a hx of back pain who presents to the Emergency Department complaining of low back pain onset this morning PTA. He reports that he was helping his brother move a Child psychotherapist and it fell. He notes that he caught the dresser and is now having pain. He denies hearing a "popping" noise. The pain radiates down the back of both legs into his feet. The pain also makes him feel like he has to have a BM. He states that he is having associated symptoms of numbness.   He states that he has tried 800 mg OTC Ibuprofen with no relief for his symptoms. He denies loss of bowel function, and any other symptoms. He reports having a hx of pinched nerve. He has had a MR Lumbar Spine completed 02/2014. His Gastrologist doesn't want him taking vicodin and he does not know why. He is allergic to Morphine and Vicodin. He has been seen for the issues that he has with the top of his neck. He is on the waiting list for [facility].    Past Medical History  Diagnosis Date  . Asthma   . Bronchitis   . Esophageal varices    Past Surgical History  Procedure Laterality Date  . Shoulder surgery    . Rotator cuff repair  1995   Family History  Problem Relation Age of Onset  . Hypertension Other   . Cancer Other    History  Substance Use Topics  . Smoking status: Former Smoker -- 0.50 packs/day    Types: Cigarettes  . Smokeless tobacco: Not on file     Comment: 3 cigarettes a day  . Alcohol Use: No    Review of Systems  Gastrointestinal: Negative for diarrhea.  Musculoskeletal: Positive  for back pain.  Neurological: Positive for numbness.      Allergies  Morphine and related and Vicodin  Home Medications   Prior to Admission medications   Medication Sig Start Date End Date Taking? Authorizing Provider  albuterol (PROVENTIL HFA;VENTOLIN HFA) 108 (90 BASE) MCG/ACT inhaler Inhale 2 puffs into the lungs every 6 (six) hours as needed (wheezing). Wheezing/shortness of breath.    Historical Provider, MD  albuterol-ipratropium (COMBIVENT) 18-103 MCG/ACT inhaler Inhale 1 puff into the lungs every 4 (four) hours.    Historical Provider, MD  Aspirin-Acetaminophen (GOODYS BODY PAIN PO) Take 1 tablet by mouth daily.    Historical Provider, MD  calcium carbonate (TUMS - DOSED IN MG ELEMENTAL CALCIUM) 500 MG chewable tablet Chew 1-2 tablets by mouth daily.     Historical Provider, MD  cyclobenzaprine (FLEXERIL) 10 MG tablet Take 10 mg by mouth 3 (three) times daily as needed for muscle spasms (muscle spasms).    Historical Provider, MD  cyclobenzaprine (FLEXERIL) 10 MG tablet Take 1 tablet (10 mg total) by mouth 2 (two) times daily as needed for muscle spasms. 05/03/14   Louann Sjogren, PA-C  ibuprofen (ADVIL,MOTRIN) 800 MG tablet Take 800 mg by mouth 3 (three) times daily. 03/05/14  Shari A Upstill, PA-C  Menthol-Methyl Salicylate (ICY HOT BALM EXTRA STRENGTH) 7.6-29 % OINT Apply 1 application topically as needed (pain).    Historical Provider, MD  methocarbamol (ROBAXIN) 500 MG tablet Take 1 tablet (500 mg total) by mouth 2 (two) times daily. 06/08/14   Chris HelperBowie Graelyn Bihl, PA-C  pantoprazole (PROTONIX) 20 MG tablet Take 20 mg by mouth 2 (two) times daily.     Historical Provider, MD  predniSONE (DELTASONE) 20 MG tablet 2 tabs po daily x 4 days 06/08/14   Chris HelperBowie Chris Canizales, PA-C  traMADol (ULTRAM) 50 MG tablet Take 1 tablet (50 mg total) by mouth every 6 (six) hours as needed. 06/08/14   Chris HelperBowie Chris Bertha, PA-C   BP 130/84 mmHg  Pulse 108  Temp(Src) 97.9 F (36.6 C) (Oral)  Resp 18  Ht 5\' 4"  (1.626 m)   Wt 195 lb (88.451 kg)  BMI 33.46 kg/m2  SpO2 97%  Physical Exam  Constitutional: He is oriented to person, place, and time. He appears well-developed and well-nourished. No distress.  HENT:  Head: Normocephalic and atraumatic.  Eyes: EOM are normal.  Neck: Neck supple. No tracheal deviation present.  Cardiovascular: Normal rate.   Pulmonary/Chest: Effort normal. No respiratory distress.  Musculoskeletal:       Lumbar back: He exhibits decreased range of motion and tenderness. He exhibits no deformity.  Patella fascia intact. DTR intact. Lumbar and para lumbar spinal tenderness on exam with decreased flexion and rotation. Without any overlaying skin changes. Positive SLR bilaterally. Able to ambulate.   Neurological: He is alert and oriented to person, place, and time.  Skin: Skin is warm and dry.  Psychiatric: He has a normal mood and affect. His behavior is normal.  Nursing note and vitals reviewed.   ED Course  Procedures (including critical care time) DIAGNOSTIC STUDIES: Oxygen Saturation is 97% on room air, normal by my interpretation.    COORDINATION OF CARE: 11:45 AM-No red flag s/s of low back pain. Patient was counseled on back pain precautions and told to do activity as tolerated but do not lift, push, or pull heavy objects more than 10 pounds for the next week. Patient counseled to use ice or heat on back for no longer than 15 minutes every hour.   Patient prescribed muscle relaxer and counseled on proper use of muscle relaxant medication.  Patient prescribed narcotic pain medicine and counseled on proper use of narcotic pain medications.  Counseled not to combine this medication with others containing tylenol.  Urged patient not to drink alcohol, drive, or perform any other activities that requires focus while taking either of these medications.   Patient urged to follow-up with PCP if pain does not improve with treatment and rest or if pain becomes recurrent. Urged to  return with worsening severe pain, loss of bowel or bladder control, trouble walking.  The patient verbalizes understanding and agrees with the plan. Discussed treatment plan which includes Ultram and Prednisone with pt at bedside and pt agreed to plan.   Labs Review Labs Reviewed - No data to display  Imaging Review No results found.   EKG Interpretation None      MDM   Final diagnoses:  Radicular low back pain    BP 130/84 mmHg  Pulse 108  Temp(Src) 97.9 F (36.6 C) (Oral)  Resp 18  Ht 5\' 4"  (1.626 m)  Wt 195 lb (88.451 kg)  BMI 33.46 kg/m2  SpO2 97%   I personally performed the services described in this documentation,  which was scribed in my presence. The recorded information has been reviewed and is accurate.    Chris Helper, PA-C 06/08/14 1149  John Young Berry III, MD 06/10/14 [phone]

## 2014-06-08 NOTE — ED Notes (Signed)
Declined W/C at D/C and was escorted to lobby by RN. 

## 2014-06-08 NOTE — Discharge Instructions (Signed)

## 2014-08-05 ENCOUNTER — Emergency Department (HOSPITAL_COMMUNITY): Payer: Medicaid Other

## 2014-08-05 ENCOUNTER — Encounter (HOSPITAL_COMMUNITY): Payer: Self-pay | Admitting: Emergency Medicine

## 2014-08-05 ENCOUNTER — Emergency Department (HOSPITAL_COMMUNITY)
Admission: EM | Admit: 2014-08-05 | Discharge: 2014-08-05 | Disposition: A | Discharge status: Home / Self Care | Payer: Medicaid Other | Attending: Emergency Medicine | Admitting: Emergency Medicine

## 2014-08-05 DIAGNOSIS — W1830XA Fall on same level, unspecified, initial encounter: Secondary | ICD-10-CM | POA: Insufficient documentation

## 2014-08-05 DIAGNOSIS — M79641 Pain in right hand: Secondary | ICD-10-CM

## 2014-08-05 DIAGNOSIS — Y9289 Other specified places as the place of occurrence of the external cause: Secondary | ICD-10-CM | POA: Insufficient documentation

## 2014-08-05 DIAGNOSIS — Z87891 Personal history of nicotine dependence: Secondary | ICD-10-CM | POA: Insufficient documentation

## 2014-08-05 DIAGNOSIS — Z8679 Personal history of other diseases of the circulatory system: Secondary | ICD-10-CM | POA: Insufficient documentation

## 2014-08-05 DIAGNOSIS — Y9389 Activity, other specified: Secondary | ICD-10-CM | POA: Insufficient documentation

## 2014-08-05 DIAGNOSIS — S6991XA Unspecified injury of right wrist, hand and finger(s), initial encounter: Secondary | ICD-10-CM | POA: Insufficient documentation

## 2014-08-05 DIAGNOSIS — Z7952 Long term (current) use of systemic steroids: Secondary | ICD-10-CM | POA: Insufficient documentation

## 2014-08-05 DIAGNOSIS — Z79899 Other long term (current) drug therapy: Secondary | ICD-10-CM | POA: Insufficient documentation

## 2014-08-05 DIAGNOSIS — Y998 Other external cause status: Secondary | ICD-10-CM | POA: Insufficient documentation

## 2014-08-05 DIAGNOSIS — J45909 Unspecified asthma, uncomplicated: Secondary | ICD-10-CM | POA: Insufficient documentation

## 2014-08-05 MED ORDER — DIAZEPAM 5 MG PO TABS
5.0000 mg | ORAL_TABLET | Freq: Once | ORAL | Status: AC
  Administered 2014-08-05: 5 mg via ORAL
  Filled 2014-08-05: qty 1

## 2014-08-05 MED ORDER — HYDROCODONE-ACETAMINOPHEN 5-325 MG PO TABS: 2.0000 | ORAL_TABLET | ORAL | Status: DC | PRN

## 2014-08-05 MED ORDER — KETOROLAC TROMETHAMINE 60 MG/2ML IM SOLN
60.0000 mg | Freq: Once | INTRAMUSCULAR | Status: AC
  Administered 2014-08-05: 60 mg via INTRAMUSCULAR
  Filled 2014-08-05: qty 2

## 2014-08-05 MED ORDER — OXYCODONE-ACETAMINOPHEN 5-325 MG PO TABS
2.0000 | ORAL_TABLET | Freq: Once | ORAL | Status: AC
  Administered 2014-08-05: 2 via ORAL
  Filled 2014-08-05: qty 2

## 2014-08-05 NOTE — ED Provider Notes (Signed)
CSN: [id number]     Arrival date & time 08/05/14  1747 History  This chart was scribed for Chris PeekBenjamin Antonis Lor, PA-C working with No att. providers found by Elveria Risingimelie Horne, ED Scribe. This patient was seen in room WTR5/WTR5 and the patient's care was started at 7:14 PM.   Chief Complaint  Patient presents with  . Hand Pain   The history is provided by the patient. No language interpreter was used.   HPI Comments: Chris Randall is a 39 y.o. male who presents to the Emergency Department with multiple digit right hand pain and arm pain incurred after a fall last weekend. Patient reports falling and landing with a portable heater wedged under his right arm/armpit. Patient reports numbing pain in his right first three fingers, stabbing pain in his right wrist and shooting pain radiating into his right arm. Patient reports treatment with Tramadol and heat, without improvement. Patient reports shooting/pins and needles pain when holding cold beverages in his hand. Patient reports change in sensation in his right fingers stating that when reaching into his pocket he is unable to tell what he is grabbing. Patient likens arm pain to having tennis ball lodged in his armpit; he however reports alleviation with supporting his right arm at the axilla. Patient shares past surgical to right rotator cuff. Patient reports losing his medicaid and states that he has a pinched nerve in his right neck that he has not been able to have treated. Patient is right hand dominant. Staets he can take Percocet.  Past Medical History  Diagnosis Date  . Asthma   . Bronchitis   . Esophageal varices    Past Surgical History  Procedure Laterality Date  . Shoulder surgery    . Rotator cuff repair  1995   Family History  Problem Relation Age of Onset  . Hypertension Other   . Cancer Other    History  Substance Use Topics  . Smoking status: Former Smoker -- 0.50 packs/day    Types: Cigarettes  . Smokeless tobacco: Not on  file     Comment: 3 cigarettes a day  . Alcohol Use: No    Review of Systems  Constitutional: Negative for fever and chills.  Musculoskeletal: Positive for myalgias.  Skin: Negative for wound.  Neurological: Positive for numbness. Negative for weakness.      Allergies  Morphine and related and Vicodin  Home Medications   Prior to Admission medications   Medication Sig Start Date End Date Taking? Authorizing Provider  albuterol (PROVENTIL HFA;VENTOLIN HFA) 108 (90 BASE) MCG/ACT inhaler Inhale 2 puffs into the lungs every 6 (six) hours as needed (wheezing). Wheezing/shortness of breath.   Yes Historical Provider, MD  albuterol-ipratropium (COMBIVENT) 18-103 MCG/ACT inhaler Inhale 1 puff into the lungs every 4 (four) hours.   Yes Historical Provider, MD  Menthol-Methyl Salicylate (ICY HOT BALM EXTRA STRENGTH) 7.6-29 % OINT Apply 1 application topically as needed (pain).   Yes Historical Provider, MD  pantoprazole (PROTONIX) 20 MG tablet Take 20 mg by mouth 2 (two) times daily.    Yes Historical Provider, MD  predniSONE (DELTASONE) 20 MG tablet 2 tabs po daily x 4 days 06/08/14  Yes Fayrene HelperBowie Tran, PA-C  traMADol (ULTRAM) 50 MG tablet Take 1 tablet (50 mg total) by mouth every 6 (six) hours as needed. 06/08/14  Yes Fayrene HelperBowie Tran, PA-C  cyclobenzaprine (FLEXERIL) 10 MG tablet Take 1 tablet (10 mg total) by mouth 2 (two) times daily as needed for muscle spasms. Patient not  taking: Reported on 08/05/2014 05/03/14   Oswaldo Conroy, PA-C  HYDROcodone-acetaminophen (NORCO/VICODIN) 5-325 MG per tablet Take 2 tablets by mouth every 4 (four) hours as needed. 08/05/14   Chris Peek, PA-C  methocarbamol (ROBAXIN) 500 MG tablet Take 1 tablet (500 mg total) by mouth 2 (two) times daily. Patient not taking: Reported on 08/05/2014 06/08/14   Fayrene Helper, PA-C   Triage Vitals: BP 139/83 mmHg  Pulse 94  Temp(Src) 98.1 F (36.7 C) (Oral)  Resp 18  SpO2 100% Physical Exam  Constitutional: He is oriented to  person, place, and time. He appears well-developed and well-nourished. No distress.  HENT:  Head: Normocephalic and atraumatic.  Eyes: Conjunctivae and EOM are normal. Right eye exhibits no discharge. Left eye exhibits no discharge.  Neck: Normal range of motion. Neck supple. No tracheal deviation present.  Cardiovascular: Normal rate, regular rhythm and normal heart sounds.   Pulmonary/Chest: Effort normal and breath sounds normal. No respiratory distress.  Musculoskeletal: Normal range of motion.  Maintains full range of motion of right wrist and all digits. Grip strength is intact but decreased due to discomfort. No obvious erythema or warmth to joints. Mildly decreased sensation over the distal tip of middle finger.Distal pulses intact. Brisk cap refill. Maintains full range of motion of right elbow and shoulder. Full range of motion of cervical or thoracic and lumbar spine. No midline bony tenderness.  Neurological: He is alert and oriented to person, place, and time.  Skin: Skin is warm and dry.  Psychiatric: He has a normal mood and affect. His behavior is normal.  Nursing note and vitals reviewed.   ED Course  Procedures (including critical care time)  SPLINT APPLICATION Date/Time: 8:45 PM Authorized by: Sharlene Motts Consent: Verbal consent obtained. Risks and benefits: risks, benefits and alternatives were discussed Consent given by: patient Splint applied by: orthopedic technician Location details: Right wrist  Splint type: Cock up wrist  Supplies used: Velcro splint  Post-procedure: The splinted body part was neurovascularly unchanged following the procedure. Patient tolerance: Patient tolerated the procedure well with no immediate complications.     COORDINATION OF CARE: 7:22 PM- Discussed treatment plan with patient at bedside and patient agreed to plan.   Labs Review Labs Reviewed - No data to display  Imaging Review Dg Hand Complete Right  08/05/2014    CLINICAL DATA:  Numbness, tingling and pain in the right hand since 08/01/2014, worsened yesterday. No known injury. Initial encounter.  EXAM: RIGHT HAND - COMPLETE 3+ VIEW  COMPARISON:  Plain films of the right hand 06/10/2012.  FINDINGS: There is no acute bony or joint abnormality. Joint spaces are preserved. Mineralization is normal. Alignment is normal. No erosion is identified. Soft tissue structures are unremarkable.  IMPRESSION: Normal examination.   Electronically Signed   By: Drusilla Kanner M.D.   On: 08/05/2014 18:57     EKG Interpretation None     Meds given in ED:  Medications  ketorolac (TORADOL) injection 60 mg (60 mg Intramuscular Given 08/05/14 1946)  diazepam (VALIUM) tablet 5 mg (5 mg Oral Given 08/05/14 1947)  oxyCODONE-acetaminophen (PERCOCET/ROXICET) 5-325 MG per tablet 2 tablet (2 tablets Oral Given 08/05/14 2004)    Discharge Medication List as of 08/05/2014  8:04 PM    START taking these medications   Details  HYDROcodone-acetaminophen (NORCO/VICODIN) 5-325 MG per tablet Take 2 tablets by mouth every 4 (four) hours as needed., Starting 08/05/2014, Until Discontinued, Print       Filed Vitals:  08/05/14 1753 08/05/14 2007  BP: 139/83 144/98  Pulse: 94 81  Temp: 98.1 F (36.7 C) 98.2 F (36.8 C)  TempSrc: Oral Oral  Resp: 18 16  SpO2: 100% 98%    MDM  Vitals stable - WNL -afebrile Pt resting comfortably in ED. received some relief from pain medicines administered in ED. PE-distal pulses intact with brisk cap refill. Motor and sensation are grossly intact with small area of decreased sensation to light touch over the distal tip of middle finger Imaging x-rays of right hand are negative for any fractures or dislocations.  Will DC with pain medicines, place patient in wrist splint for comfort. Given orthopedic follow-up if symptoms persist. No evidence of hemarthrosis, septic joint, vascular compromise or other acute or emergent pathology. I discussed all  relevant lab findings and imaging results with pt and they verbalized understanding. Discussed f/u with PCP within 48 hrs and return precautions, pt very amenable to plan.  Final diagnoses:  Right hand pain   I personally performed the services described in this documentation, which was scribed in my presence. The recorded information has been reviewed and is accurate.    Chris Peek, PA-C 08/06/14 2046  Linwood Dibbles, MD 08/07/14 [phone]

## 2014-08-05 NOTE — Discharge Instructions (Signed)
Pain of Unknown Etiology (Pain Without a Known Cause) You have come to your caregiver because of pain. Pain can occur in any part of the body. Often there is not a definite cause. If your laboratory (blood or urine) work was normal and X-rays or other studies were normal, your caregiver may treat you without knowing the cause of the pain. An example of this is the headache. Most headaches are diagnosed by taking a history. This means your caregiver asks you questions about your headaches. Your caregiver determines a treatment based on your answers. Usually testing done for headaches is normal. Often testing is not done unless there is no response to medications. Regardless of where your pain is located today, you can be given medications to make you comfortable. If no physical cause of pain can be found, most cases of pain will gradually leave as suddenly as they came.  If you have a painful condition and no reason can be found for the pain, it is important that you follow up with your caregiver. If the pain becomes worse or does not go away, it may be necessary to repeat tests and look further for a possible cause.  Only take over-the-counter or prescription medicines for pain, discomfort, or fever as directed by your caregiver.  For the protection of your privacy, test results cannot be given over the phone. Make sure you receive the results of your test. Ask how these results are to be obtained if you have not been informed. It is your responsibility to obtain your test results.  You may continue all activities unless the activities cause more pain. When the pain lessens, it is important to gradually resume normal activities. Resume activities by beginning slowly and gradually increasing the intensity and duration of the activities or exercise. During periods of severe pain, bed rest may be helpful. Lie or sit in any position that is comfortable.  Ice used for acute (sudden) conditions may be effective.  Use a large plastic bag filled with ice and wrapped in a towel. This may provide pain relief.  See your caregiver for continued problems. Your caregiver can help or refer you for exercises or physical therapy if necessary. If you were given medications for your condition, do not drive, operate machinery or power tools, or sign legal documents for 24 hours. Do not drink alcohol, take sleeping pills, or take other medications that may interfere with treatment. See your caregiver immediately if you have pain that is becoming worse and not relieved by medications. Document Released: 02/02/2001 Document Revised: 02/28/2013 Document Reviewed: 05/10/2005 [facility] Patient Information 2015 [place], [place]. This information is not intended to replace advice given to you by your health care provider. Make sure you discuss any questions you have with your health care provider.  Your evaluation in the ED today did not show any emergent causes for your right hand pain your x-rays were negative for any fractures or dislocations. Please take your pain medicines as prescribed. Please follow-up with orthopedics for further evaluation and management of your symptoms. He will also need to establish care with a PCP. You have been given a resource guide to help find a PCP. Return to ED for new or worsening symptoms.   Emergency Department Resource Guide 1) Find a Doctor and Pay Out of Pocket Although you won't have to find out who is covered by your insurance plan, it is a good idea to ask around and get recommendations. You will then need to call the  office and see if the doctor you have chosen will accept you as a new patient and what types of options they offer for patients who are self-pay. Some doctors offer discounts or will set up payment plans for their patients who do not have insurance, but you will need to ask so you aren't surprised when you get to your appointment.  2) Contact Your Local Health Department Not  all health departments have doctors that can see patients for sick visits, but many do, so it is worth a call to see if yours does. If you don't know where your local health department is, you can check in your phone book. The CDC also has a tool to help you locate your state's health department, and many state websites also have listings of all of their local health departments.  3) Find a Walk-in Clinic If your illness is not likely to be very severe or complicated, you may want to try a walk in clinic. These are popping up all over the country in pharmacies, drugstores, and shopping centers. They're usually staffed by nurse practitioners or physician assistants that have been trained to treat common illnesses and complaints. They're usually fairly quick and inexpensive. However, if you have serious medical issues or chronic medical problems, these are probably not your best option.  No Primary Care Doctor: - Call Health Connect at  [phone] - they can help you locate a primary care doctor that  accepts your insurance, provides certain services, etc. - Physician Referral Service- [phone]  Chronic Pain Problems: Organization         Address  Phone   Notes  [facility]  [phone]) [phone] Patients need to be referred by their primary care doctor.   Medication Assistance: Organization         Address  Phone   Notes  [facility] [address] [place], [place] [address] [phone]) [phone] --Must be a resident of [facility] [place] -- Must have NO insurance coverage whatsoever (no Medicaid/ Medicare, etc.) -- The pt. MUST have a primary care doctor that directs their care regularly and follows them in the community   [facility]  [phone]   [employer]  [phone]) [phone]    Agencies that provide inexpensive medical care: Organization         Address  Phone   Notes  [facility]  [phone]   [facility]    [phone]   [facility] [address] [place], [place] [address] [phone]) [phone]   [facility] [address], [place] [phone]   [facility]anned [facility]    [phone]   [facility]    [phone]   [facility]  [address], [place] Phone:  [phone](336) [phone], Fax:  [phone] Hours of Operation:  9 am - 6 pm, M-F.  Also accepts Medicaid/Medicare and self-pay.  [facility]  [address], [place] Phone: [phone], Fax: [phone]) [phone]. Hours of Operation:  8:30 am - 5:30 pm, M-F.  Also accepts Medicaid and self-pay.  [facility] [place] [address], [place] Phone: [phone](336) [phone]   [facility] [address], [place], [place] [phone], Ext. [phone] Mondays & Thursdays: 7-9 AM.  First 15 patients are seen on a first come, first serve basis.    Medicaid-accepting  [facility] Providers:  Organization         Address  Phone   Notes  [facility] [address], [place] [phone] Also accepts self-pay patients.  [facility] [address] Laurell Josephs [place], [place]  [phone]   [facility] [address], [place] [phone]   [facility] [address] [place] [phone]   Renaye Rakers [address], [place]   [phone] Only accepts [place] Access [place] patients after they have their name applied to their card.   Self-Pay (no insurance) in [facility]:  Organization         Address  Phone   Notes  Sickle Cell Patients, [facility] [address] [place], [place] [phone]   [facility] [address] [place], [place] [phone]   [facility] [place]  [address], [place] [phone]   [facility]/Dr. Osei-Bonsu  [address], [place] or [address], [place] [phone] Phone number for both [place] and [place] locations is the same.  [facility] [address], [place] [phone]   [facility] [address], [place] or [address] [phone]   [facility] [address] [place] [phone], phone; [phone], fax Sees patients 1st and 3rd Saturday of every month.  Must not qualify for public or private insurance (i.e. Medicaid, Medicare, [place] Health Choice, Veterans' Benefits)  Household income should be no more than 200% of the poverty level The clinic cannot treat you if you are pregnant or think you are pregnant  Sexually transmitted diseases are not treated at the clinic.    Dental Care: Organization         Address  Phone  Notes  [facility] [address] [place], [place] [phone] Accepts children up to age 24 who are enrolled in [place] or [place] Health Choice; pregnant women with a Medicaid card; and children who have applied for Medicaid or [place] Health Choice, but were declined, whose parents can pay a reduced fee at time of service.  [facility]  [address], [place] [phone] Accepts children up to age 46 who are enrolled in [place] or [place] Health Choice; pregnant women with a Medicaid card; and children who have applied for Medicaid or [place] Health Choice, but were declined, whose parents can pay a reduced fee at time of service.  [facility]  [address] [place], [place] [phone] Patients are seen by appointment only. Walk-ins are not accepted. [facility] will see patients 47 years of age and older. Monday - Tuesday (8am-5pm) Most Wednesdays (8:30-5pm) $30 per  visit, cash only  [facility]  [address], [facility] [phone] Patients are seen by appointment only. Walk-ins are not accepted. [facility] will see patients 42 years of age and older. One Wednesday Evening (Monthly: Volunteer Based).  $30 per visit, cash only  [employer]  [phone] for adults; Children under age 46, call Graduate Pediatric Dentistry at [phone]. Children aged 89-14, please call [phone] to request a pediatric  application.  Dental services are provided in all areas of dental care including fillings, crowns and bridges, complete and partial dentures, implants, gum treatment, root canals, and extractions. Preventive care is also provided. Treatment is provided to both adults and children. Patients are selected via a lottery and there is often a waiting list.   [facility] [address], [place]  [phone] [email or web address]   [facility] [address] [place], [place] [phone], Ext. [phone] Second and Fourth Thursday of each month, opens at 6:30 AM; Clinic ends at 9 AM.  Patients are seen on a first-come first-served basis, and a limited number are seen during each clinic.   [facility]  [address] [place], [place] [phone]   Eligibility Requirements You must have lived in [place], [place], or [place] counties for at least the last three months.   You cannot be eligible for state or federal sponsored [place], including [employer], [place], or [employer].   You generally cannot be eligible for healthcare insurance through your employer.    How to apply: Eligibility screenings are held every Tuesday and Wednesday afternoon from 1:00 pm until 4:00 pm. You do not need an appointment for the interview!  [facility] [address], [place], [place] [phone]   [facility] Health Department   [phone]   [facility] Health Department  [phone]   [facility]  [phone]    Behavioral Health Resources in the Community: Intensive Outpatient Programs Organization         Address  Phone  Notes  [facility] Services [address], [place], [place] [phone]   [facility] Outpatient [address] [place], [place] [phone]   [facility] [address], [place], [place]  [phone]   [facility] Mental Health [address]  [place], [place] [phone] or [phone]   Substance Abuse Resources Organization         Address  Phone  Notes  [facility] Drug Services  [phone]   [facility]  [phone]   [place]  [phone]   Floydene Flock  [phone]   Residential & Outpatient Substance Abuse Program  [phone]   Psychological Services Organization         Address  Phone  Notes  [facility] Behavioral Health  [phone]   [facility] Services  [phone]   [facility] Mental Health [address], [place] [phone] or [phone]    Mobile Crisis Teams Organization         Address  Phone  Notes  [facility], Mobile Crisis Care Unit  [phone]   [facility]  [address] [place], [place] [phone]   Doristine Locks [address] [place] [phone]    Self-Help/Support Groups Organization         Address  Phone             Notes  [facility] [place] - variety of support groups  336- I7437963 Call for more information  Narcotics Anonymous (NA), [facility] [address], [employer] [place]  2 meetings at this location   Statistician         Address  Phone  Notes  [facility] [address],    [place]  [phone]   [facility]  [address] [place], [place] [address]2426, [place],  [facility] [phone]     [facility] [address] [place], [place] [phone] Admissions: 8am-3pm M-F  [facility] [address]    [place], [place] [phone]   [facility] [address] [place], [place], [place] [phone]   [facility] [address]  [place], [place] [phone]   [facility] - Intensive Outpatient [address]ure[address], [place], [place] [phone]   [facility] ([facility] [address] [place]  [place], [place] [phone] or [phone]   [facility] Services (RTS) [address] [place], [place] [phone] Accepts Medicaid  [facility] [address],  [place] [phone] Substance Abuse/Addiction Treatment   [facility] Organization         Address  Phone  Notes  [facility]  [phone]   Angie Fava, PhD [address] Ervin Knack [place], [place]   [phone] or [phone]   [facility] Behavioral   [address] [place], [place] [phone]   [facility] [address], [place], [place] [phone] Insurance/Medicaid/sponsorship through [facility] Families [address]                                    [place], [place] [phone] Therapy/tele-psych/case  [facility] [address], [place] [phone]    Dr. Lolly Mustache  [phone]   [facility] [place]  [employer] [facility]opaedic Surgery Dept. 1) [address] [place] 2) [address] [place] 3)  [address], [place] [phone]  [phone]   [facility] Child Abuse Hotline (973) 723-4953 or ([phone] (After Hours)

## 2014-08-05 NOTE — ED Notes (Signed)
Per pt, states he was watching a race at home-got up and caught his right thumb, index and middle finger on a heater

## 2014-08-19 ENCOUNTER — Encounter (HOSPITAL_COMMUNITY): Payer: Self-pay | Admitting: Emergency Medicine

## 2014-08-19 ENCOUNTER — Emergency Department (HOSPITAL_COMMUNITY)
Admission: EM | Admit: 2014-08-19 | Discharge: 2014-08-19 | Disposition: A | Discharge status: Home / Self Care | Payer: Medicaid Other | Attending: Emergency Medicine | Admitting: Emergency Medicine

## 2014-08-19 DIAGNOSIS — M25511 Pain in right shoulder: Secondary | ICD-10-CM | POA: Insufficient documentation

## 2014-08-19 DIAGNOSIS — Z87891 Personal history of nicotine dependence: Secondary | ICD-10-CM | POA: Insufficient documentation

## 2014-08-19 DIAGNOSIS — M62838 Other muscle spasm: Secondary | ICD-10-CM | POA: Insufficient documentation

## 2014-08-19 DIAGNOSIS — M542 Cervicalgia: Secondary | ICD-10-CM

## 2014-08-19 DIAGNOSIS — Z7952 Long term (current) use of systemic steroids: Secondary | ICD-10-CM | POA: Insufficient documentation

## 2014-08-19 DIAGNOSIS — J45909 Unspecified asthma, uncomplicated: Secondary | ICD-10-CM | POA: Insufficient documentation

## 2014-08-19 DIAGNOSIS — Z8679 Personal history of other diseases of the circulatory system: Secondary | ICD-10-CM | POA: Insufficient documentation

## 2014-08-19 DIAGNOSIS — Z9889 Other specified postprocedural states: Secondary | ICD-10-CM | POA: Insufficient documentation

## 2014-08-19 DIAGNOSIS — Z79899 Other long term (current) drug therapy: Secondary | ICD-10-CM | POA: Insufficient documentation

## 2014-08-19 DIAGNOSIS — G8929 Other chronic pain: Secondary | ICD-10-CM

## 2014-08-19 MED ORDER — IBUPROFEN 600 MG PO TABS: 600.0000 mg | ORAL_TABLET | Freq: Four times a day (QID) | ORAL | Status: DC | PRN

## 2014-08-19 MED ORDER — ICY HOT BALM EXTRA STRENGTH 7.6-29 % EX OINT: 1.0000 "application " | TOPICAL_OINTMENT | CUTANEOUS | Status: DC | PRN

## 2014-08-19 MED ORDER — TRAMADOL HCL 50 MG PO TABS: 50.0000 mg | ORAL_TABLET | Freq: Four times a day (QID) | ORAL | Status: DC | PRN

## 2014-08-19 MED ORDER — DIAZEPAM 5 MG PO TABS: 5.0000 mg | ORAL_TABLET | Freq: Two times a day (BID) | ORAL | Status: DC

## 2014-08-19 NOTE — ED Provider Notes (Signed)
CSN: [id number]     Arrival date & time 08/19/14  1621 History   This chart was scribed for Junius Finner, PA-C, working with Blake Divine, MD by Jolene Provost, ED Scribe. This patient was seen in room WTR8/WTR8 and the patient's care was started at 5:19 PM.    Chief Complaint  Patient presents with  . Arm Pain    Patient is a 39 y.o. male presenting with arm pain. The history is provided by the patient. No language interpreter was used.  Arm Pain    HPI Comments: Chris Randall is a 39 y.o. male who presents to the Emergency Department complaining of arm pain with associated numbness in fingers.  Pain is 10/10 aching and sore. Pt states that he has chronic neck pain that has now moved into his arm. Pt states he does not insurance at this time, and has been kicked out of the pain clinic due to his inability to pay and missing a mandatory pill count. Pt has a past hx of shoulder surgery by [facility] orthopedics, and has pins in his shoulder. Reports having pain worsened after fall 2-3 weeks ago, was seen in ED at that time, prescribed tramadol and then vicodin. Denies any injuries since then.     Past Medical History  Diagnosis Date  . Asthma   . Bronchitis   . Esophageal varices    Past Surgical History  Procedure Laterality Date  . Shoulder surgery    . Rotator cuff repair  1995   Family History  Problem Relation Age of Onset  . Hypertension Other   . Cancer Other    History  Substance Use Topics  . Smoking status: Former Smoker -- 0.50 packs/day    Types: Cigarettes  . Smokeless tobacco: Not on file     Comment: 3 cigarettes a day  . Alcohol Use: No    Review of Systems  Constitutional: Negative for fever and chills.  Musculoskeletal: Positive for neck pain.       Arm pain.  Neurological: Positive for weakness and numbness.  All other systems reviewed and are negative.   Allergies  Morphine and related and Vicodin  Home Medications   Prior to Admission  medications   Medication Sig Start Date End Date Taking? Authorizing Provider  albuterol (PROVENTIL HFA;VENTOLIN HFA) 108 (90 BASE) MCG/ACT inhaler Inhale 2 puffs into the lungs every 6 (six) hours as needed (wheezing). Wheezing/shortness of breath.   Yes Historical Provider, MD  albuterol-ipratropium (COMBIVENT) 18-103 MCG/ACT inhaler Inhale 1 puff into the lungs every 4 (four) hours as needed for wheezing or shortness of breath.    Yes Historical Provider, MD  oxyCODONE-acetaminophen (PERCOCET/ROXICET) 5-325 MG per tablet Take 1 tablet by mouth every 8 (eight) hours as needed for severe pain.   Yes Historical Provider, MD  pantoprazole (PROTONIX) 20 MG tablet Take 20 mg by mouth 2 (two) times daily.    Yes Historical Provider, MD  cyclobenzaprine (FLEXERIL) 10 MG tablet Take 1 tablet (10 mg total) by mouth 2 (two) times daily as needed for muscle spasms. Patient not taking: Reported on 08/05/2014 05/03/14   Oswaldo Conroy, PA-C  diazepam (VALIUM) 5 MG tablet Take 1 tablet (5 mg total) by mouth 2 (two) times daily. 08/19/14   Junius Finner, PA-C  HYDROcodone-acetaminophen (NORCO/VICODIN) 5-325 MG per tablet Take 2 tablets by mouth every 4 (four) hours as needed. Patient not taking: Reported on 08/19/2014 08/05/14   Joycie Peek, PA-C  ibuprofen (ADVIL,MOTRIN) 600 MG tablet  Take 1 tablet (600 mg total) by mouth every 6 (six) hours as needed. 08/19/14   Junius FinnerErin O'Malley, PA-C  Menthol-Methyl Salicylate (ICY HOT BALM EXTRA STRENGTH) 7.6-29 % OINT Apply 1 application topically as needed (pain). 08/19/14   Junius FinnerErin O'Malley, PA-C  methocarbamol (ROBAXIN) 500 MG tablet Take 1 tablet (500 mg total) by mouth 2 (two) times daily. Patient not taking: Reported on 08/05/2014 06/08/14   Fayrene HelperBowie Tran, PA-C  predniSONE (DELTASONE) 20 MG tablet 2 tabs po daily x 4 days Patient not taking: Reported on 08/19/2014 06/08/14   Fayrene HelperBowie Tran, PA-C  traMADol (ULTRAM) 50 MG tablet Take 1 tablet (50 mg total) by mouth every 6 (six) hours  as needed. 08/19/14   Junius FinnerErin O'Malley, PA-C   BP 135/100 mmHg  Pulse 97  Temp(Src) 97.4 F (36.3 C) (Oral)  Resp 18  SpO2 99% Physical Exam  Constitutional: He is oriented to person, place, and time. He appears well-developed and well-nourished. No distress.  HENT:  Head: Normocephalic and atraumatic.  Eyes: Pupils are equal, round, and reactive to light.  Neck: Neck supple.  Cardiovascular: Normal rate.   Pulmonary/Chest: Effort normal. No respiratory distress.  Musculoskeletal: Normal range of motion. He exhibits tenderness.  TTP to the right upper trapezius. Limited active ROM due to pain. Passive ROM increased compared to active, but still limited due to pain.   Neurological: He is alert and oriented to person, place, and time. Coordination normal.  Skin: Skin is warm and dry. He is not diaphoretic.  Psychiatric: He has a normal mood and affect. His behavior is normal.  Nursing note and vitals reviewed.   ED Course  Procedures  DIAGNOSTIC STUDIES: Oxygen Saturation is 99% on RA, normal by my interpretation.    COORDINATION OF CARE: 5:25 PM Discussed treatment plan with pt at bedside and pt agreed to plan.  Labs Review Labs Reviewed - No data to display  Imaging Review No results found.   EKG Interpretation None      MDM   Final diagnoses:  Chronic neck pain  Right shoulder pain  Muscle spasm    Pt is a 39yo male with hx of chronic pain c/o right shoulder pain from recent injury 2-3 weeks ago. Was evaluated at that time. No new injuries. Pt in process of getting PCP so he can f/u with a pain clinic and neurosurgery.  Discussed with pt that pain is chronic in nature. Rx: tramadol, valium and ibuprofen. Discussed not using a sling as concerned for frozen shoulder. Discussed and demonstrated home ROM exercises.  Advised to f[facility] with [facility] and may be seen at their walk-in clinic.   I personally performed the services described in this documentation, which was scribed in  my presence. The recorded information has been reviewed and is accurate.    Junius Finnerrin O'Malley, PA-C 08/19/14 2000  Blake DivineJohn Wofford, MD 08/20/14 1520

## 2014-08-19 NOTE — ED Notes (Addendum)
Pt states she has chronic neck problem and now is having right arm pain and numbness from it. Pt states is suppose to have surgery on it. Pt states he does not have insurance at the moment so cannot return to previous doctor, in process of getting insurance and PCP straight.

## 2014-08-19 NOTE — Discharge Instructions (Signed)
ED Resources °Pain Management Centers/Resources in the Surrounding Area ° °[facility] °[address] °[place] [address] °336-765-6181 ° °[facility] °[address] °[place] [address] °336-297-2271 ° °[facility] °[address] °[place], [place] [address] °336-282-0132 ° °[facility] °[address] °[place] [address] °336-268-2530 ° °[facility] °[address] °[place] [address] °336-635-6810 ° ° °Emergency Department Resource Guide °1) Find a Doctor and Pay Out of Pocket °Although you won't have to find out who is covered by your insurance plan, it is a good idea to ask around and get recommendations. You will then need to call the office and see if the doctor you have chosen will accept you as a new patient and what types of options they offer for patients who are self-pay. Some doctors offer discounts or will set up payment plans for their patients who do not have insurance, but you will need to ask so you aren't surprised when you get to your appointment. ° °2) Contact Your Local Health Department °Not all health departments have doctors that can see patients for sick visits, but many do, so it is worth a call to see if yours does. If you don't know where your local health department is, you can check in your phone book. The CDC also has a tool to help you locate your state's health department, and many state websites also have listings of all of their local health departments. ° °3) Find a Walk-in Clinic °If your illness is not likely to be very severe or complicated, you may want to try a walk in clinic. These are popping up all over the country in pharmacies, drugstores, and shopping centers. They're usually staffed by nurse practitioners or physician assistants that have been trained to treat common illnesses and complaints. They're  usually fairly quick and inexpensive. However, if you have serious medical issues or chronic medical problems, these are probably not your best option. ° °No Primary Care Doctor: °- Call [facility] at  832-8000 - they can help you locate a primary care doctor that  accepts your insurance, provides certain services, etc. °- Physician Referral Service- 1-800-533-3463 ° °Chronic Pain Problems: °Organization         Address  Phone   Notes  °[facility]  (336) 297-2271 Patients need to be referred by their primary care doctor.  ° °Medication Assistance: °Organization         Address  Phone   Notes  °[place] [facility] [address] °[place], [place] [address] (336) 641-8030 --Must be a resident of [place] °-- Must have NO insurance coverage whatsoever (no Medicaid/ Medicare, etc.) °-- The pt. MUST have a primary care doctor that directs their care regularly and follows them in the community °  °[facility]  (866) 331-1348   °[employer]  (888) 892-1162   ° °Agencies that provide inexpensive medical care: °Organization         Address                                                       Phone                                                                              Notes  °[facility]  (336) 832-8035   °[facility]    (336) 832-7272   °[facility] [address] °[place], [place] [address] (336) 832-4777   °[facility] [address], °[place] (336) 271-4999   °[facility]    (336) 373-0678   °[facility]    (336) 272-1050   °[facility] ° [address], [place] Phone:  (336) 832-4444, Fax:  (336) 832-4440 Hours of Operation:  9 am - 6 pm, M-F.  Also accepts Medicaid/Medicare and self-pay.  °[facility] ° [address], [place] Phone: (336) 832-3150, Fax: (336) 832-3151. Hours of  Operation:  8:30 am - 5:30 pm, M-F.  Also accepts Medicaid and self-pay.  °[facility] [place] [address], [place] Phone: (336) 878-6027   °[facility] [address], [place], [place] (336)723-1848, Ext. 123 Mondays & Thursdays: 7-9 AM.  First 15 patients are seen on a first come, first serve basis. °  ° °Medicaid-accepting [place] Providers: ° °Organization         Address                                                                       Phone                               Notes  °[facility] [address], [place] (336) 641-2100 Also accepts self-pay patients.  °[facility] [address], [place] ° (336) 856-9996   °[facility] [address], [place] (336) 288-8857   °[facility] [address] [place] [address], [place] (336) 299-7000   °Veita Bland [address], [place]  ° (336) 373-1557 Only accepts [place] Access Medicaid patients after they have their name applied to their card.  ° °Self-Pay (no insurance) in [place]: °  °Organization         Address                                                     Phone               Notes  °Sickle Cell Patients, [facility] [address], [place] (336) 832-1970   °[facility] [address], [place] (336) 832-4400   °[facility] ° [address], [place] (336) 992-4800   °[facility]/Dr. Osei-Bonsu ° [address] [place] [address], [place] or [address], [place] (336) 841-8500 Phone number for both [place] and [place] locations is the same.  °[facility] [address], [place] (336) 299-0000   °[facility] [address] [place] [address], [place] or [address] (336) 852-7530 °(336) 878-2260   °[facility] [address], [place] (336)  350-1642, phone; (336) 294-5005, fax Sees patients 1st and 3rd Saturday of every month.  Must not qualify for public or private insurance (i.e. Medicaid, Medicare, [place] Health Choice, Veterans' Benefits) • Household income should be no more than 200% of the poverty level •The clinic cannot treat you if you are pregnant or think you are pregnant • Sexually transmitted diseases are not treated at the clinic.  ° ° °Dental Care: °Organization         Address                                  Phone                       Notes  °[facility] [address], [place] (336) 641-6152 Accepts children up to age 21 who are enrolled in Medicaid or [place] Health Choice; pregnant women with a Medicaid card; and children who have applied for Medicaid or [place] Health Choice, but were declined, whose parents can pay a reduced fee at time of service.  °[facility] [place]  [address], [place] (336) 641-7733 Accepts children up to age 21 who are enrolled in Medicaid or [place] Health Choice; pregnant women with a Medicaid card; and children who have applied for Medicaid or [place] Health Choice, but were declined, whose parents can pay a reduced fee at time of service.  °[facility] ° [address], [place] (336) 641-4533 Patients are seen by appointment only. Walk-ins are not accepted. [facility] will see patients 18 years of age and older. °Monday - Tuesday (8am-5pm) °Most Wednesdays (8:30-5pm) °$30 per visit, cash only  °[facility] ° [address], [place] (336) 641-4533 Patients are seen by appointment only. Walk-ins are not accepted. [facility] will see patients 18 years of age and older. °One Wednesday Evening (Monthly: Volunteer Based).  $30 per visit, cash only  °[facility]  (919) 537-3737 for adults; Children under age 4, call Graduate  Pediatric Dentistry at ([phone]) [phone]. Children aged 4-14, please call (919) [phone] to request a pediatric application. ° Dental services are provided in all areas of dental care including fillings, crowns and bridges, complete and partial dentures, implants, gum treatment, root canals, and extractions. Preventive care is also provided. Treatment is provided to both adults and children. °Patients are selected via a lottery and there is often a waiting list. °  °[facility] [address], °[place] ° (336) 763-8833 [email or web address] °  °[facility] [address], [place], [place] (336)723-1848, Ext. 123 Second and Fourth Thursday of each month, opens at 6:30 AM; Clinic ends at 9 AM.  Patients are seen on a first-come first-served basis, and a limited number are seen during each clinic.  ° °[facility] ° [address], [place], [place] (336) 723-7904   Eligibility Requirements °You must have lived in [place], [place], or [place] counties for at least the last three months. °  You cannot be eligible for state or federal sponsored healthcare insurance, including Veterans Administration, Medicaid, or Medicare. °  You generally cannot be eligible for healthcare insurance through your employer.  °  How to apply: °Eligibility screenings are held every   Tuesday and Wednesday afternoon from 1:00 pm until 4:00 pm. You do not need an appointment for the interview!  °[facility] [address], [place], [place] 336-631-2330   °[facility]  336-342-8273   °[facility]  336-703-3100   °[facility]  336-570-6415   ° °Behavioral Health Resources in the Community: °Intensive Outpatient Programs °Organization         Address                                              Phone              Notes  °[facility] [address], [place], [place] 336-878-6098   °[facility] [address], [place], [place] 336-832-9800   °[facility] [address], [place], [place] ° 336-882-2125   °[facility] [address],  °[place], [place] 1-800-853-5163 or 336-641-4981   °Substance Abuse Resources °Organization         Address                                Phone  Notes  °[facility]  336-882-2125   °[facility]  336-784-9470   °[facility]  336-285-9073   °[facility]  336-845-3988   °Residential & Outpatient Substance Abuse Program  1-800-659-3381   °Psychological Services °Organization         Address                                  Phone                Notes  °[facility]  336- 832-9600   °[facility]  336- 378-7881   °[facility] [address], [place] 1-800-853-5163 or 336-641-4981   ° °Mobile Crisis Teams °Organization         Address  Phone  Notes  °[facility], Mobile Crisis Care Unit  1-877-626-1772   °Assertive °[facility] ° [address] [place], [place] 336-834-9664   °Sharon DeEsch [address] °[place] 336-554-5454   ° °Self-Help/Support Groups °Organization         Address                         Phone             Notes  °[facility] - variety of support groups  336- 373-1402 Call for more information  °Narcotics Anonymous (NA), [facility] [address], °[place]  2 meetings at this location  ° °Residential Treatment Programs °Organization         Address                                                    Phone              Notes  °[facility] [address],    °[place]  1-866-801-8205   °[facility] ° [address], [place], [place] 704-293-8524   °[facility] [address], [place] 336-845-3988 Admissions: 8am-3pm M-F  °[facility] [address]    °[place], [place]  336-841-1104   °[facility] [address], [place], [place] 336-379-7146   °[facility] [address]  °[place], [place] 336-285-9073   °[facility] - Intensive Outpatient [address], [place], [place] 336-852-3033   °[facility] [address]  °[place], [place] 1-877-615-2722 or 336-784-9470   °[facility] [address] [place], [place] 336-227-7417 Accepts Medicaid  °[facility] [address]  °[place] 1-800-659-3381 Substance Abuse/Addiction Treatment  ° °[place] Behavioral Health Resources °Organization         Address                                                            Phone                    Notes  °[facility]  (888) 581-9988   °Julie Brannon, PhD [address] [place], [place]   (336) 349-5553 or (336) 951-0000   °[facility]   [address] °[place], [place] (336) 349-4454   °[facility] [address], [place], [place] (336) 342-8316 Insurance/Medicaid/sponsorship through [facility]  °[facility] [address]                                    [place], [place] (336) 342-8316 Therapy/tele-psych/case  °[facility] [address]  ° [place], [place] (336) 349-2233    °Dr. Arfeen  (336) 349-4544   °[facility] [place]  [employer] [place] [facility] 1) [address],  °2) [address], [place] °3)  [address], [place] (336) 349-3220 °(336) 342-7768 ° °(336) 342-8140   °[place] [facility] (336) 342-1394 or (336) 342-3537 (After Hours)    ° ° ° ° °

## 2014-10-20 ENCOUNTER — Emergency Department (HOSPITAL_COMMUNITY): Payer: Medicaid Other

## 2014-10-20 ENCOUNTER — Emergency Department (HOSPITAL_COMMUNITY)
Admission: EM | Admit: 2014-10-20 | Discharge: 2014-10-20 | Disposition: A | Discharge status: Home / Self Care | Payer: Self-pay | Attending: Emergency Medicine | Admitting: Emergency Medicine

## 2014-10-20 ENCOUNTER — Encounter (HOSPITAL_COMMUNITY): Payer: Self-pay

## 2014-10-20 DIAGNOSIS — G8929 Other chronic pain: Secondary | ICD-10-CM | POA: Insufficient documentation

## 2014-10-20 DIAGNOSIS — R000 Tachycardia, unspecified: Secondary | ICD-10-CM | POA: Insufficient documentation

## 2014-10-20 DIAGNOSIS — J45909 Unspecified asthma, uncomplicated: Secondary | ICD-10-CM | POA: Insufficient documentation

## 2014-10-20 DIAGNOSIS — M545 Low back pain: Secondary | ICD-10-CM | POA: Insufficient documentation

## 2014-10-20 DIAGNOSIS — R200 Anesthesia of skin: Secondary | ICD-10-CM | POA: Insufficient documentation

## 2014-10-20 DIAGNOSIS — R1012 Left upper quadrant pain: Secondary | ICD-10-CM | POA: Insufficient documentation

## 2014-10-20 DIAGNOSIS — R109 Unspecified abdominal pain: Secondary | ICD-10-CM

## 2014-10-20 DIAGNOSIS — M549 Dorsalgia, unspecified: Secondary | ICD-10-CM

## 2014-10-20 DIAGNOSIS — R112 Nausea with vomiting, unspecified: Secondary | ICD-10-CM | POA: Insufficient documentation

## 2014-10-20 DIAGNOSIS — R197 Diarrhea, unspecified: Secondary | ICD-10-CM | POA: Insufficient documentation

## 2014-10-20 DIAGNOSIS — Z720 Tobacco use: Secondary | ICD-10-CM | POA: Insufficient documentation

## 2014-10-20 DIAGNOSIS — R509 Fever, unspecified: Secondary | ICD-10-CM | POA: Insufficient documentation

## 2014-10-20 DIAGNOSIS — M546 Pain in thoracic spine: Secondary | ICD-10-CM | POA: Insufficient documentation

## 2014-10-20 DIAGNOSIS — R61 Generalized hyperhidrosis: Secondary | ICD-10-CM | POA: Insufficient documentation

## 2014-10-20 DIAGNOSIS — L299 Pruritus, unspecified: Secondary | ICD-10-CM | POA: Insufficient documentation

## 2014-10-20 LAB — COMPREHENSIVE METABOLIC PANEL
ALK PHOS: 98 U/L (ref 38–126)
ALT: 22 U/L (ref 17–63)
ANION GAP: 10 (ref 5–15)
AST: 30 U/L (ref 15–41)
Albumin: 3.7 g/dL (ref 3.5–5.0)
BILIRUBIN TOTAL: 0.4 mg/dL (ref 0.3–1.2)
BUN: 10 mg/dL (ref 6–20)
CO2: 24 mmol/L (ref 22–32)
Calcium: 8.5 mg/dL — ABNORMAL LOW (ref 8.9–10.3)
Chloride: 107 mmol/L (ref 101–111)
Creatinine, Ser: 1.09 mg/dL (ref 0.61–1.24)
GFR calc Af Amer: >60 mL/min (ref >60)
GFR calc non Af Amer: >60 mL/min (ref >60)
GLUCOSE: 90 mg/dL (ref 65–99)
Potassium: 4 mmol/L (ref 3.5–5.1)
Sodium: 141 mmol/L (ref 135–145)
TOTAL PROTEIN: 6.3 g/dL — AB (ref 6.5–8.1)

## 2014-10-20 LAB — CBC WITH DIFFERENTIAL/PLATELET
BASOS ABS: 0.1 10*3/uL (ref 0.0–0.1)
Basophils Relative: 1 % (ref 0–1)
EOS ABS: 0.1 10*3/uL (ref 0.0–0.7)
Eosinophils Relative: 1 % (ref 0–5)
HCT: 46 % (ref 39.0–52.0)
Hemoglobin: 15.8 g/dL (ref 13.0–17.0)
Lymphocytes Relative: 37 % (ref 12–46)
Lymphs Abs: 3.4 10*3/uL (ref 0.7–4.0)
MCH: 28.9 pg (ref 26.0–34.0)
MCHC: 34.3 g/dL (ref 30.0–36.0)
MCV: 84.1 fL (ref 78.0–100.0)
Monocytes Absolute: 0.7 10*3/uL (ref 0.1–1.0)
Monocytes Relative: 8 % (ref 3–12)
Neutro Abs: 5 10*3/uL (ref 1.7–7.7)
Neutrophils Relative %: 53 % (ref 43–77)
PLATELETS: 287 10*3/uL (ref 150–400)
RBC: 5.47 MIL/uL (ref 4.22–5.81)
RDW: 13.2 % (ref 11.5–15.5)
WBC: 9.2 10*3/uL (ref 4.0–10.5)

## 2014-10-20 LAB — URINALYSIS, ROUTINE W REFLEX MICROSCOPIC
Bilirubin Urine: NEGATIVE
Glucose, UA: NEGATIVE mg/dL
HGB URINE DIPSTICK: NEGATIVE
KETONES UR: NEGATIVE mg/dL
Leukocytes, UA: NEGATIVE
Nitrite: NEGATIVE
Protein, ur: NEGATIVE mg/dL
SPECIFIC GRAVITY, URINE: 1.019 (ref 1.005–1.030)
UROBILINOGEN UA: 0.2 mg/dL (ref 0.0–1.0)
pH: 5 (ref 5.0–8.0)

## 2014-10-20 LAB — SEDIMENTATION RATE: Sed Rate: 1 mm/hr (ref 0–16)

## 2014-10-20 LAB — C-REACTIVE PROTEIN: CRP: 1.6 mg/dL — AB (ref <1.0)

## 2014-10-20 LAB — I-STAT CG4 LACTIC ACID, ED: Lactic Acid, Venous: 2.59 mmol/L (ref 0.5–2.0)

## 2014-10-20 LAB — LIPASE, BLOOD: Lipase: 19 U/L — ABNORMAL LOW (ref 22–51)

## 2014-10-20 LAB — TROPONIN I: Troponin I: <0.03 ng/mL (ref <0.031)

## 2014-10-20 MED ORDER — NAPROXEN 500 MG PO TABS: 500.0000 mg | ORAL_TABLET | Freq: Two times a day (BID) | ORAL | Status: DC

## 2014-10-20 MED ORDER — FENTANYL CITRATE (PF) 100 MCG/2ML IJ SOLN
50.0000 ug | Freq: Once | INTRAMUSCULAR | Status: AC
  Administered 2014-10-20: 50 ug via INTRAVENOUS

## 2014-10-20 MED ORDER — PREDNISONE 20 MG PO TABS
60.0000 mg | ORAL_TABLET | Freq: Once | ORAL | Status: AC
  Administered 2014-10-20: 60 mg via ORAL
  Filled 2014-10-20: qty 3

## 2014-10-20 MED ORDER — SODIUM CHLORIDE 0.9 % IV BOLUS (SEPSIS)
1000.0000 mL | Freq: Once | INTRAVENOUS | Status: AC
  Administered 2014-10-20: 1000 mL via INTRAVENOUS

## 2014-10-20 MED ORDER — PANTOPRAZOLE SODIUM 40 MG PO TBEC
40.0000 mg | DELAYED_RELEASE_TABLET | Freq: Once | ORAL | Status: AC
  Administered 2014-10-20: 40 mg via ORAL
  Filled 2014-10-20: qty 1

## 2014-10-20 MED ORDER — ONDANSETRON 8 MG PO TBDP: 8.0000 mg | ORAL_TABLET | Freq: Three times a day (TID) | ORAL | Status: DC | PRN

## 2014-10-20 MED ORDER — OXYCODONE-ACETAMINOPHEN 5-325 MG PO TABS
1.5000 | ORAL_TABLET | Freq: Once | ORAL | Status: AC
  Administered 2014-10-20: 1.5 via ORAL
  Filled 2014-10-20: qty 2

## 2014-10-20 MED ORDER — DIPHENHYDRAMINE HCL 25 MG PO CAPS: 25.0000 mg | ORAL_CAPSULE | Freq: Four times a day (QID) | ORAL | Status: DC | PRN

## 2014-10-20 MED ORDER — PREDNISONE 50 MG PO TABS: 50.0000 mg | ORAL_TABLET | Freq: Every day | ORAL | Status: DC

## 2014-10-20 MED ORDER — FENTANYL CITRATE (PF) 100 MCG/2ML IJ SOLN
100.0000 ug | Freq: Once | INTRAMUSCULAR | Status: AC
  Administered 2014-10-20: 100 ug via INTRAVENOUS
  Filled 2014-10-20: qty 2

## 2014-10-20 MED ORDER — FENTANYL CITRATE (PF) 100 MCG/2ML IJ SOLN
50.0000 ug | Freq: Once | INTRAMUSCULAR | Status: AC
  Administered 2014-10-20: 50 ug via INTRAVENOUS
  Filled 2014-10-20: qty 2

## 2014-10-20 MED ORDER — IOHEXOL 300 MG/ML  SOLN
100.0000 mL | Freq: Once | INTRAMUSCULAR | Status: AC | PRN
  Administered 2014-10-20: 100 mL via INTRAVENOUS

## 2014-10-20 NOTE — ED Notes (Signed)
Pt requesting MD to order his home medication for ABD problems states he didn't take any of his medications today.

## 2014-10-20 NOTE — ED Notes (Signed)
Per EMS, pt complains of flank pain on the left side, pt has been vomiting after breakfast ever day but feels better after that. Pt has extreme pain in both flanks when he has a bowel movement. Pt has hx of peptic ulcer. 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 Pt in no acute distress upon arrival, alert and oriented x 4. Pt also has hx of asthma. Pt received neb in route, pt has some expiratory wheezing with ems.

## 2014-10-20 NOTE — ED Provider Notes (Signed)
CSN: [id number]     Arrival date & time 10/20/14  0235 History  This chart was scribed for Chris Kaplan, MD by Phillis Haggis, ED Scribe. This patient was seen in room A11C/A11C and patient care was started at 3:39 AM.   Chief Complaint  Patient presents with  . Flank Pain   The history is provided by the patient and the EMS personnel. No language interpreter was used.  HPI Comments: Chris Randall is a 39 y.o. male with a history of asthma who presents to the Emergency Department brought in by EMS complaining of left flank pain onset two weeks ago. He states that he eats at [facility] every morning before work and will then experience profuse vomiting and diaphoresis 20 minutes after eating. He states that his abdominal pain will go away with vomiting, but will increase with bowel movements. He reports the pain as "feeling like someone ripped my insides out." His friend/parther reports that he has associated intermittent subjective fever, blood mixed with vomit, and some blood in his diarrhea. He reports tingling and numbness to his fingers. He reports a history of esophageal varices. He denies history of liver problems or alcohol abuse. He denies melena. He denies IV drug use or street drug use. He denies any history of abdominal surgery.  He states that his Medicaid was discontinued when his son turned 61, so he was unable to be seen by pain management in the past-  And was dismissed by the service. He also has back pain - but it is chronic in nature, and not associated with the current abdominal pain.  Past Medical History  Diagnosis Date  . Asthma   . Bronchitis   . Esophageal varices    Past Surgical History  Procedure Laterality Date  . Shoulder surgery    . Rotator cuff repair  1995   Family History  Problem Relation Age of Onset  . Hypertension Other   . Cancer Other    History  Substance Use Topics  . Smoking status: Current Every Day Smoker -- 0.50 packs/day    Types:  Cigarettes  . Smokeless tobacco: Current User     Comment: 3 cigarettes a day  . Alcohol Use: Yes     Comment: occasional    Review of Systems  Constitutional: Positive for fever and diaphoresis.  Eyes: Positive for itching.  Gastrointestinal: Positive for nausea, vomiting, abdominal pain, diarrhea and blood in stool.  Genitourinary: Positive for flank pain.  Musculoskeletal: Positive for back pain.  Neurological: Positive for numbness.  All other systems reviewed and are negative.   Allergies  Morphine and related and Vicodin  Home Medications   Prior to Admission medications   Medication Sig Start Date End Date Taking? Authorizing Provider  albuterol (PROVENTIL HFA;VENTOLIN HFA) 108 (90 BASE) MCG/ACT inhaler Inhale 2 puffs into the lungs every 6 (six) hours as needed (wheezing). Wheezing/shortness of breath.   Yes Historical Provider, MD  albuterol-ipratropium (COMBIVENT) 18-103 MCG/ACT inhaler Inhale 1 puff into the lungs every 4 (four) hours as needed for wheezing or shortness of breath.    Yes Historical Provider, MD  calcium carbonate (TUMS - DOSED IN MG ELEMENTAL CALCIUM) 500 MG chewable tablet Chew 4 tablets by mouth 4 (four) times daily as needed for indigestion or heartburn.   Yes Historical Provider, MD  pantoprazole (PROTONIX) 20 MG tablet Take 20 mg by mouth 2 (two) times daily.    Yes Historical Provider, MD  diazepam (VALIUM) 5 MG tablet Take  1 tablet (5 mg total) by mouth 2 (two) times daily. Patient not taking: Reported on 10/20/2014 08/19/14   Junius Finner, PA-C  HYDROcodone-acetaminophen (NORCO/VICODIN) 5-325 MG per tablet Take 2 tablets by mouth every 4 (four) hours as needed. Patient not taking: Reported on 08/19/2014 08/05/14   Joycie Peek, PA-C  ibuprofen (ADVIL,MOTRIN) 600 MG tablet Take 1 tablet (600 mg total) by mouth every 6 (six) hours as needed. Patient not taking: Reported on 10/20/2014 08/19/14   Junius Finner, PA-C  Menthol-Methyl Salicylate (ICY HOT  BALM EXTRA STRENGTH) 7.6-29 % OINT Apply 1 application topically as needed (pain). Patient not taking: Reported on 10/20/2014 08/19/14   Junius Finner, PA-C  methocarbamol (ROBAXIN) 500 MG tablet Take 1 tablet (500 mg total) by mouth 2 (two) times daily. Patient not taking: Reported on 08/05/2014 06/08/14   Fayrene Helper, PA-C  naproxen (NAPROSYN) 500 MG tablet Take 1 tablet (500 mg total) by mouth 2 (two) times daily. 10/20/14   Chris Kaplan, MD  ondansetron (ZOFRAN ODT) 8 MG disintegrating tablet Take 1 tablet (8 mg total) by mouth every 8 (eight) hours as needed for nausea. 10/20/14   Chris Kaplan, MD  traMADol (ULTRAM) 50 MG tablet Take 1 tablet (50 mg total) by mouth every 6 (six) hours as needed. Patient not taking: Reported on 10/20/2014 08/19/14   Junius Finner, PA-C   BP 128/67 mmHg  Pulse 110  Temp(Src) 99.4 F (37.4 C) (Oral)  Resp 22  Ht  (1.626 m)  Wt 164 lb (74.39 kg)  BMI 28.14 kg/m2  SpO2 95%  Physical Exam  Constitutional: He is oriented to person, place, and time. He appears well-developed and well-nourished.  HENT:  Head: Normocephalic and atraumatic.  Eyes: EOM are normal.  Neck: Normal range of motion. Neck supple.  Cardiovascular: Tachycardia present.   Pulmonary/Chest: Effort normal.  Abdominal: Soft. There is tenderness. There is no tenderness at McBurney's point.  Left flank tenderness, upper quadrant tenderness with voluntary guarding  Musculoskeletal: Normal range of motion.  Lower thoracic and diffuse lumbar spine tenderness  No step offs, no erythema. Pt has 2+ patellar reflex bilaterally. Able to discriminate between sharp and dull. Able to ambulate   Neurological: He is alert and oriented to person, place, and time.  Skin: Skin is warm and dry.  Psychiatric: He has a normal mood and affect. His behavior is normal.  Nursing note and vitals reviewed.   ED Course  Procedures (including critical care time) DIAGNOSTIC STUDIES: Oxygen Saturation is  95% on room air, adequate by my interpretation.    COORDINATION OF CARE: 3:50 AM-Discussed treatment plan which includes pain medication and labs with pt at bedside and pt agreed to plan.   Labs Review Labs Reviewed  COMPREHENSIVE METABOLIC PANEL - Abnormal; Notable for the following:    Calcium 8.5 (*)    Total Protein 6.3 (*)    All other components within normal limits  C-REACTIVE PROTEIN - Abnormal; Notable for the following:    CRP 1.6 (*)    All other components within normal limits  LIPASE, BLOOD - Abnormal; Notable for the following:    Lipase 19 (*)    All other components within normal limits  I-STAT CG4 LACTIC ACID, ED - Abnormal; Notable for the following:    Lactic Acid, Venous 2.59 (*)    All other components within normal limits  CBC WITH DIFFERENTIAL/PLATELET  TROPONIN I  SEDIMENTATION RATE  URINALYSIS, ROUTINE W REFLEX MICROSCOPIC (NOT AT [facility])  POC OCCULT  BLOOD, ED    Imaging Review Ct Abdomen Pelvis W Contrast  10/20/2014   CLINICAL DATA:  LEFT flank pain, vomiting yesterday morning, bilateral flank pain with bowel movement for 1 week. History of peptic ulcer.  EXAM: CT ABDOMEN AND PELVIS WITH CONTRAST  TECHNIQUE: Multidetector CT imaging of the abdomen and pelvis was performed using the standard protocol following bolus administration of intravenous contrast.  CONTRAST:  100mL OMNIPAQUE IOHEXOL 300 MG/ML  SOLN  COMPARISON:  CT abdomen and pelvis May 06, 2013  FINDINGS: LUNG BASES: Included view of the lung bases are clear. Visualized heart and pericardium are unremarkable.  SOLID ORGANS: The liver demonstrates focal fatty infiltration about the falciform ligament and, is otherwise unremarkable. Spleen, gallbladder, pancreas and adrenal glands are unremarkable.  GASTROINTESTINAL TRACT: Small hiatal hernia. The stomach, small and large bowel are normal in course and caliber without inflammatory changes. Mild colonic diverticulosis. Normal appendix.  KIDNEYS/  URINARY TRACT: Kidneys are orthotopic, demonstrating symmetric enhancement. No nephrolithiasis, hydronephrosis or solid renal masses. The unopacified ureters are normal in course and caliber. Delayed imaging through the kidneys demonstrates symmetric prompt contrast excretion within the proximal urinary collecting system. Urinary bladder is partially distended and unremarkable.  PERITONEUM/RETROPERITONEUM: Aortoiliac vessels are normal in course and caliber, mild calcific atherosclerosis. No lymphadenopathy by CT size criteria. Prostate is unremarkable. No intraperitoneal free fluid nor free air.  SOFT TISSUE/OSSEOUS STRUCTURES: Non-suspicious.  IMPRESSION: No acute intra-abdominal or pelvic process.   Electronically Signed   By: Awilda Metroourtnay  Bloomer M.D.   On: 10/20/2014 06:19     EKG Interpretation None      MDM   Final diagnoses:  Abdominal pain in male  Chronic back pain   I personally performed the services described in this documentation, which was scribed in my presence. The recorded information has been reviewed and is accurate.   Pt with abd pain x 2 weeks. He has several + constitutional associated with that, and the pain is L sided, worse with po intake. Concerns for PUD. He has varices, reports blood mixed in his emesis - but his Hb is normal, and he also reports blood in his stools - but the hemoccult is neg. Not concerned for upper GI bleed-  He likely has PUD. He also has subjective fevers, sweats. No ivda, no murmurs. Sed rate and CRP are neg. Tachycardia - but no other SIRS criteria.  LActate is 2.59 - iv fluids given here. The value is in the indeterminate zone.  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 Discitis and epidural abscess considered, only due to the concerning constitutional - but sed rate is neg, crp is neg-  So they are unlikely.  Pt reassessed post labs - he reports being in  significant discomfort. CT ordered -  No infarcts seen. Pt is not at risk for mesenteric ischemia clinically. Advised pt to see his Gi doctor - as PUD is high on the ddx currently.   Chris KaplanAnkit Areya Lemmerman, MD 10/20/14 [phone]

## 2014-10-20 NOTE — Discharge Instructions (Signed)
We saw you in the ER for back pain and abdominal pain. The exam doesn't indicate any spinal cord involvement - and thus we feel comfortable sending you home. Recommend back exercise to strengthen the core muscles.  We are not sure what is causing your abdominal pain, and recommend that you see your primary doctor for pain control and to see if specialist care is needed. ER workup is limited to life threatening causes only - so you might need further workup.   Back Exercises Back exercises help treat and prevent back injuries. The goal of back exercises is to increase the strength of your abdominal and back muscles and the flexibility of your back. These exercises should be started when you no longer have back pain. Back exercises include:  Pelvic Tilt. Lie on your back with your knees bent. Tilt your pelvis until the lower part of your back is against the floor. Hold this position 5 to 10 sec and repeat 5 to 10 times.  Knee to Chest. Pull first 1 knee up against your chest and hold for 20 to 30 seconds, repeat this with the other knee, and then both knees. This may be done with the other leg straight or bent, whichever feels better.  Sit-Ups or Curl-Ups. Bend your knees 90 degrees. Start with tilting your pelvis, and do a partial, slow sit-up, lifting your trunk only 30 to 45 degrees off the floor. Take at least 2 to 3 seconds for each sit-up. Do not do sit-ups with your knees out straight. If partial sit-ups are difficult, simply do the above but with only tightening your abdominal muscles and holding it as directed.  Hip-Lift. Lie on your back with your knees flexed 90 degrees. Push down with your feet and shoulders as you raise your hips a couple inches off the floor; hold for 10 seconds, repeat 5 to 10 times.  Back arches. Lie on your stomach, propping yourself up on bent elbows. Slowly press on your hands, causing an arch in your low back. Repeat 3 to 5 times. Any initial stiffness and  discomfort should lessen with repetition over time.  Shoulder-Lifts. Lie face down with arms beside your body. Keep hips and torso pressed to floor as you slowly lift your head and shoulders off the floor. Do not overdo your exercises, especially in the beginning. Exercises may cause you some mild back discomfort which lasts for a few minutes; however, if the pain is more severe, or lasts for more than 15 minutes, do not continue exercises until you see your caregiver. Improvement with exercise therapy for back problems is slow.  See your caregivers for assistance with developing a proper back exercise program. Document Released: 06/17/2004 Document Revised: 08/02/2011 Document Reviewed: 03/11/2011 [facility] Patient Information 2015 [place], [place]. This information is not intended to replace advice given to you by your health care provider. Make sure you discuss any questions you have with your health care provider.  Abdominal Pain Many things can cause abdominal pain. Usually, abdominal pain is not caused by a disease and will improve without treatment. It can often be observed and treated at home. Your health care provider will do a physical exam and possibly order blood tests and X-rays to help determine the seriousness of your pain. However, in many cases, more time must pass before a clear cause of the pain can be found. Before that point, your health care provider may not know if you need more testing or further treatment. HOME CARE  INSTRUCTIONS  Monitor your abdominal pain for any changes. The following actions may help to alleviate any discomfort you are experiencing:  Only take over-the-counter or prescription medicines as directed by your health care provider.  Do not take laxatives unless directed to do so by your health care provider.  Try a clear liquid diet (broth, tea, or water) as directed by your health care provider. Slowly move to a bland diet as tolerated. SEEK MEDICAL CARE  IF:  You have unexplained abdominal pain.  You have abdominal pain associated with nausea or diarrhea.  You have pain when you urinate or have a bowel movement.  You experience abdominal pain that wakes you in the night.  You have abdominal pain that is worsened or improved by eating food.  You have abdominal pain that is worsened with eating fatty foods.  You have a fever. SEEK IMMEDIATE MEDICAL CARE IF:   Your pain does not go away within 2 hours.  You keep throwing up (vomiting).  Your pain is felt only in portions of the abdomen, such as the right side or the left lower portion of the abdomen.  You pass bloody or black tarry stools. MAKE SURE YOU:  Understand these instructions.   Will watch your condition.   Will get help right away if you are not doing well or get worse.  Document Released: 02/17/2005 Document Revised: 05/15/2013 Document Reviewed: 01/17/2013 [facility] Patient Information 2015 [place]. This information is not intended to replace advice given to you by your health care provider. Make sure you discuss any questions you have with your health care provider.

## 2014-10-20 NOTE — ED Notes (Signed)
Patient transported to CT 

## 2014-10-22 LAB — POC OCCULT BLOOD, ED: FECAL OCCULT BLD: NEGATIVE

## 2014-11-20 ENCOUNTER — Encounter (HOSPITAL_COMMUNITY): Payer: Self-pay | Admitting: Emergency Medicine

## 2014-11-20 DIAGNOSIS — R61 Generalized hyperhidrosis: Secondary | ICD-10-CM | POA: Insufficient documentation

## 2014-11-20 DIAGNOSIS — Z720 Tobacco use: Secondary | ICD-10-CM | POA: Insufficient documentation

## 2014-11-20 DIAGNOSIS — J45901 Unspecified asthma with (acute) exacerbation: Secondary | ICD-10-CM | POA: Insufficient documentation

## 2014-11-20 DIAGNOSIS — M549 Dorsalgia, unspecified: Secondary | ICD-10-CM | POA: Insufficient documentation

## 2014-11-20 DIAGNOSIS — R197 Diarrhea, unspecified: Secondary | ICD-10-CM | POA: Insufficient documentation

## 2014-11-20 DIAGNOSIS — Z8679 Personal history of other diseases of the circulatory system: Secondary | ICD-10-CM | POA: Insufficient documentation

## 2014-11-20 DIAGNOSIS — K859 Acute pancreatitis, unspecified: Secondary | ICD-10-CM | POA: Insufficient documentation

## 2014-11-20 DIAGNOSIS — Z791 Long term (current) use of non-steroidal anti-inflammatories (NSAID): Secondary | ICD-10-CM | POA: Insufficient documentation

## 2014-11-20 DIAGNOSIS — Z79899 Other long term (current) drug therapy: Secondary | ICD-10-CM | POA: Insufficient documentation

## 2014-11-20 DIAGNOSIS — Z7952 Long term (current) use of systemic steroids: Secondary | ICD-10-CM | POA: Insufficient documentation

## 2014-11-20 NOTE — ED Notes (Signed)
Pt. reports mid abdominal pain with emesis and diarrhea / low back pain onset this week. Denies fever or chills.

## 2014-11-21 ENCOUNTER — Emergency Department (HOSPITAL_COMMUNITY)
Admission: EM | Admit: 2014-11-21 | Discharge: 2014-11-21 | Disposition: A | Discharge status: Home / Self Care | Payer: Medicaid Other | Attending: Emergency Medicine | Admitting: Emergency Medicine

## 2014-11-21 DIAGNOSIS — K859 Acute pancreatitis, unspecified: Secondary | ICD-10-CM

## 2014-11-21 LAB — COMPREHENSIVE METABOLIC PANEL
ALBUMIN: 3.8 g/dL (ref 3.5–5.0)
ALK PHOS: 95 U/L (ref 38–126)
ALT: 16 U/L — ABNORMAL LOW (ref 17–63)
ANION GAP: 9 (ref 5–15)
AST: 21 U/L (ref 15–41)
BILIRUBIN TOTAL: 0.5 mg/dL (ref 0.3–1.2)
BUN: 12 mg/dL (ref 6–20)
CO2: 27 mmol/L (ref 22–32)
CREATININE: 1.2 mg/dL (ref 0.61–1.24)
Calcium: 8.5 mg/dL — ABNORMAL LOW (ref 8.9–10.3)
Chloride: 100 mmol/L — ABNORMAL LOW (ref 101–111)
GFR calc Af Amer: >60 mL/min (ref >60)
Glucose, Bld: 124 mg/dL — ABNORMAL HIGH (ref 65–99)
Potassium: 3.6 mmol/L (ref 3.5–5.1)
Sodium: 136 mmol/L (ref 135–145)
Total Protein: 6.5 g/dL (ref 6.5–8.1)

## 2014-11-21 LAB — CBC WITH DIFFERENTIAL/PLATELET
BASOS PCT: 1 % (ref 0–1)
Basophils Absolute: 0.1 10*3/uL (ref 0.0–0.1)
EOS PCT: 1 % (ref 0–5)
Eosinophils Absolute: 0.1 10*3/uL (ref 0.0–0.7)
HEMATOCRIT: 45.3 % (ref 39.0–52.0)
HEMOGLOBIN: 15.4 g/dL (ref 13.0–17.0)
Lymphocytes Relative: 30 % (ref 12–46)
Lymphs Abs: 2.8 10*3/uL (ref 0.7–4.0)
MCH: 28.2 pg (ref 26.0–34.0)
MCHC: 34 g/dL (ref 30.0–36.0)
MCV: 83 fL (ref 78.0–100.0)
Monocytes Absolute: 0.8 10*3/uL (ref 0.1–1.0)
Monocytes Relative: 9 % (ref 3–12)
NEUTROS PCT: 59 % (ref 43–77)
Neutro Abs: 5.4 10*3/uL (ref 1.7–7.7)
Platelets: 280 10*3/uL (ref 150–400)
RBC: 5.46 MIL/uL (ref 4.22–5.81)
RDW: 12.7 % (ref 11.5–15.5)
WBC: 9.3 10*3/uL (ref 4.0–10.5)

## 2014-11-21 LAB — LIPASE, BLOOD: LIPASE: 61 U/L — AB (ref 22–51)

## 2014-11-21 MED ORDER — PROMETHAZINE HCL 25 MG/ML IJ SOLN
25.0000 mg | Freq: Once | INTRAMUSCULAR | Status: AC
  Administered 2014-11-21: 25 mg via INTRAVENOUS
  Filled 2014-11-21: qty 1

## 2014-11-21 MED ORDER — SODIUM CHLORIDE 0.9 % IV SOLN
1000.0000 mL | Freq: Once | INTRAVENOUS | Status: AC
  Administered 2014-11-21: 1000 mL via INTRAVENOUS

## 2014-11-21 MED ORDER — HYDROMORPHONE HCL 1 MG/ML IJ SOLN
1.0000 mg | Freq: Once | INTRAMUSCULAR | Status: AC
  Administered 2014-11-21: 1 mg via INTRAVENOUS
  Filled 2014-11-21: qty 1

## 2014-11-21 MED ORDER — PANTOPRAZOLE SODIUM 40 MG IV SOLR
40.0000 mg | Freq: Once | INTRAVENOUS | Status: AC
  Administered 2014-11-21: 40 mg via INTRAVENOUS
  Filled 2014-11-21: qty 40

## 2014-11-21 MED ORDER — OXYCODONE-ACETAMINOPHEN 5-325 MG PO TABS: 1.0000 | ORAL_TABLET | ORAL | Status: DC | PRN

## 2014-11-21 MED ORDER — IPRATROPIUM-ALBUTEROL 0.5-2.5 (3) MG/3ML IN SOLN
3.0000 mL | Freq: Once | RESPIRATORY_TRACT | Status: AC
  Administered 2014-11-21: 3 mL via RESPIRATORY_TRACT
  Filled 2014-11-21: qty 3

## 2014-11-21 MED ORDER — ONDANSETRON HCL 4 MG/2ML IJ SOLN
4.0000 mg | Freq: Once | INTRAMUSCULAR | Status: AC
Start: 2014-11-21 — End: 2014-11-21
  Administered 2014-11-21: 4 mg via INTRAVENOUS
  Filled 2014-11-21: qty 2

## 2014-11-21 MED ORDER — SODIUM CHLORIDE 0.9 % IV SOLN
1000.0000 mL | INTRAVENOUS | Status: DC
  Administered 2014-11-21: 1000 mL via INTRAVENOUS

## 2014-11-21 MED ORDER — PROMETHAZINE HCL 25 MG PO TABS: 25.0000 mg | ORAL_TABLET | Freq: Four times a day (QID) | ORAL | Status: DC | PRN

## 2014-11-21 MED ORDER — METOCLOPRAMIDE HCL 5 MG/ML IJ SOLN
10.0000 mg | Freq: Once | INTRAMUSCULAR | Status: AC
  Administered 2014-11-21: 10 mg via INTRAVENOUS
  Filled 2014-11-21: qty 2

## 2014-11-21 NOTE — Discharge Instructions (Signed)
Acute Pancreatitis Acute pancreatitis is a disease in which the pancreas becomes suddenly inflamed. The pancreas is a large gland located behind your stomach. The pancreas produces enzymes that help digest food. The pancreas also releases the hormones glucagon and insulin that help regulate blood sugar. Damage to the pancreas occurs when the digestive enzymes from the pancreas are activated and begin attacking the pancreas before being released into the intestine. Most acute attacks last a couple of days and can cause serious complications. Some people become dehydrated and develop low blood pressure. In severe cases, bleeding into the pancreas can lead to shock and can be life-threatening. The lungs, heart, and kidneys may fail. CAUSES  Pancreatitis can happen to anyone. In some cases, the cause is unknown. Most cases are caused by:  Alcohol abuse.  Gallstones. Other less common causes are:  Certain medicines.  Exposure to certain chemicals.  Infection.  Damage caused by an accident (trauma).  Abdominal surgery. SYMPTOMS   Pain in the upper abdomen that may radiate to the back.  Tenderness and swelling of the abdomen.  Nausea and vomiting. DIAGNOSIS  Your caregiver will perform a physical exam. Blood and stool tests may be done to confirm the diagnosis. Imaging tests may also be done, such as X-rays, CT scans, or an ultrasound of the abdomen. TREATMENT  Treatment usually requires a stay in the hospital. Treatment may include:  Pain medicine.  Fluid replacement through an intravenous line (IV).  Placing a tube in the stomach to remove stomach contents and control vomiting.  Not eating for 3 or 4 days. This gives your pancreas a rest, because enzymes are not being produced that can cause further damage.  Antibiotic medicines if your condition is caused by an infection.  Surgery of the pancreas or gallbladder. HOME CARE INSTRUCTIONS   Follow the diet advised by your  caregiver. This may involve avoiding alcohol and decreasing the amount of fat in your diet.  Eat smaller, more frequent meals. This reduces the amount of digestive juices the pancreas produces.  Drink enough fluids to keep your urine clear or pale yellow.  Only take over-the-counter or prescription medicines as directed by your caregiver.  Avoid drinking alcohol if it caused your condition.  Do not smoke.  Get plenty of rest.  Check your blood sugar at home as directed by your caregiver.  Keep all follow-up appointments as directed by your caregiver. SEEK MEDICAL CARE IF:   You do not recover as quickly as expected.  You develop new or worsening symptoms.  You have persistent pain, weakness, or nausea.  You recover and then have another episode of pain. SEEK IMMEDIATE MEDICAL CARE IF:   You are unable to eat or keep fluids down.  Your pain becomes severe.  You have a fever or persistent symptoms for more than 2 to 3 days.  You have a fever and your symptoms suddenly get worse.  Your skin or the white part of your eyes turn yellow (jaundice).  You develop vomiting.  You feel dizzy, or you faint.  Your blood sugar is high (over 300 mg/dL). MAKE SURE YOU:   Understand these instructions.  Will watch your condition.  Will get help right away if you are not doing well or get worse. Document Released: 05/10/2005 Document Revised: 11/09/2011 Document Reviewed: 08/19/2011 [facility] Patient Information 2015 [place], [place]. This information is not intended to replace advice given to you by your health care provider. Make sure you discuss any questions you have  with your health care provider.  Acetaminophen; Oxycodone tablets What is this medicine? ACETAMINOPHEN; OXYCODONE (a set a MEE noe fen; ox i KOE done) is a pain reliever. It is used to treat mild to moderate pain. This medicine may be used for other purposes; ask your health care provider or pharmacist if you have  questions. COMMON BRAND NAME(S): Endocet, Magnacet, Narvox, Percocet, Perloxx, Primalev, Primlev, Roxicet, Xolox What should I tell my health care provider before I take this medicine? They need to know if you have any of these conditions: -brain tumor -Crohn's disease, inflammatory bowel disease, or ulcerative colitis -drug abuse or addiction -head injury -heart or circulation problems -if you often drink alcohol -kidney disease or problems going to the bathroom -liver disease -lung disease, asthma, or breathing problems -an unusual or allergic reaction to acetaminophen, oxycodone, other opioid analgesics, other medicines, foods, dyes, or preservatives -pregnant or trying to get pregnant -breast-feeding How should I use this medicine? Take this medicine by mouth with a full glass of water. Follow the directions on the prescription label. Take your medicine at regular intervals. Do not take your medicine more often than directed. Talk to your pediatrician regarding the use of this medicine in children. Special care may be needed. Patients over 79 years old may have a stronger reaction and need a smaller dose. Overdosage: If you think you have taken too much of this medicine contact a poison control center or emergency room at once. NOTE: This medicine is only for you. Do not share this medicine with others. What if I miss a dose? If you miss a dose, take it as soon as you can. If it is almost time for your next dose, take only that dose. Do not take double or extra doses. What may interact with this medicine? -alcohol -antihistamines -barbiturates like amobarbital, butalbital, butabarbital, methohexital, pentobarbital, phenobarbital, thiopental, and secobarbital -benztropine -drugs for bladder problems like solifenacin, trospium, oxybutynin, tolterodine, hyoscyamine, and methscopolamine -drugs for breathing problems like ipratropium and tiotropium -drugs for certain stomach or  intestine problems like propantheline, homatropine methylbromide, glycopyrrolate, atropine, belladonna, and dicyclomine -general anesthetics like etomidate, ketamine, nitrous oxide, propofol, desflurane, enflurane, halothane, isoflurane, and sevoflurane -medicines for depression, anxiety, or psychotic disturbances -medicines for sleep -muscle relaxants -naltrexone -narcotic medicines (opiates) for pain -phenothiazines like perphenazine, thioridazine, chlorpromazine, mesoridazine, fluphenazine, prochlorperazine, promazine, and trifluoperazine -scopolamine -tramadol -trihexyphenidyl This list may not describe all possible interactions. Give your health care provider a list of all the medicines, herbs, non-prescription drugs, or dietary supplements you use. Also tell them if you smoke, drink alcohol, or use illegal drugs. Some items may interact with your medicine. What should I watch for while using this medicine? Tell your doctor or health care professional if your pain does not go away, if it gets worse, or if you have new or a different type of pain. You may develop tolerance to the medicine. Tolerance means that you will need a higher dose of the medication for pain relief. Tolerance is normal and is expected if you take this medicine for a long time. Do not suddenly stop taking your medicine because you may develop a severe reaction. Your body becomes used to the medicine. This does NOT mean you are addicted. Addiction is a behavior related to getting and using a drug for a non-medical reason. If you have pain, you have a medical reason to take pain medicine. Your doctor will tell you how much medicine to take. If your doctor wants you to  stop the medicine, the dose will be slowly lowered over time to avoid any side effects. You may get drowsy or dizzy. Do not drive, use machinery, or do anything that needs mental alertness until you know how this medicine affects you. Do not stand or sit up  quickly, especially if you are an older patient. This reduces the risk of dizzy or fainting spells. Alcohol may interfere with the effect of this medicine. Avoid alcoholic drinks. There are different types of narcotic medicines (opiates) for pain. If you take more than one type at the same time, you may have more side effects. Give your health care provider a list of all medicines you use. Your doctor will tell you how much medicine to take. Do not take more medicine than directed. Call emergency for help if you have problems breathing. The medicine will cause constipation. Try to have a bowel movement at least every 2 to 3 days. If you do not have a bowel movement for 3 days, call your doctor or health care professional. Do not take Tylenol (acetaminophen) or medicines that have acetaminophen with this medicine. Too much acetaminophen can be very dangerous. Many nonprescription medicines contain acetaminophen. Always read the labels carefully to avoid taking more acetaminophen. What side effects may I notice from receiving this medicine? Side effects that you should report to your doctor or health care professional as soon as possible: -allergic reactions like skin rash, itching or hives, swelling of the face, lips, or tongue -breathing difficulties, wheezing -confusion -light headedness or fainting spells -severe stomach pain -unusually weak or tired -yellowing of the skin or the whites of the eyes Side effects that usually do not require medical attention (report to your doctor or health care professional if they continue or are bothersome): -dizziness -drowsiness -nausea -vomiting This list may not describe all possible side effects. Call your doctor for medical advice about side effects. You may report side effects to FDA at 1-800-FDA-1088. Where should I keep my medicine? Keep out of the reach of children. This medicine can be abused. Keep your medicine in a safe place to protect it from  theft. Do not share this medicine with anyone. Selling or giving away this medicine is dangerous and against the law. Store at room temperature between 20 and 25 degrees C (68 and 77 degrees F). Keep container tightly closed. Protect from light. This medicine may cause accidental overdose and death if it is taken by other adults, children, or pets. Flush any unused medicine down the toilet to reduce the chance of harm. Do not use the medicine after the expiration date. NOTE: This sheet is a summary. It may not cover all possible information. If you have questions about this medicine, talk to your doctor, pharmacist, or health care provider.  2015, [employer]. (2013-01-01 13:17:35)  Promethazine tablets What is this medicine? PROMETHAZINE (proe METH a zeen) is an antihistamine. It is used to treat allergic reactions and to treat or prevent nausea and vomiting from illness or motion sickness. It is also used to make you sleep before surgery, and to help treat pain or nausea after surgery. This medicine may be used for other purposes; ask your health care provider or pharmacist if you have questions. COMMON BRAND NAME(S): Phenergan What should I tell my health care provider before I take this medicine? They need to know if you have any of these conditions: -glaucoma -high blood pressure or heart disease -kidney disease -liver disease -lung or breathing disease, like  asthma -prostate trouble -pain or difficulty passing urine -seizures -an unusual or allergic reaction to promethazine or phenothiazines, other medicines, foods, dyes, or preservatives -pregnant or trying to get pregnant -breast-feeding How should I use this medicine? Take this medicine by mouth with a glass of water. Follow the directions on the prescription label. Take your doses at regular intervals. Do not take your medicine more often than directed. Talk to your pediatrician regarding the use of this medicine in  children. Special care may be needed. This medicine should not be given to infants and children younger than 42 years old. Overdosage: If you think you have taken too much of this medicine contact a poison control center or emergency room at once. NOTE: This medicine is only for you. Do not share this medicine with others. What if I miss a dose? If you miss a dose, take it as soon as you can. If it is almost time for your next dose, take only that dose. Do not take double or extra doses. What may interact with this medicine? Do not take this medicine with any of the following medications: -cisapride -dofetilide -dronedarone -MAOIs like Carbex, Eldepryl, Marplan, Nardil, Parnate -pimozide -quinidine, including dextromethorphan; quinidine -thioridazine -ziprasidone This medicine may also interact with the following medications: -certain medicines for depression, anxiety, or psychotic disturbances -certain medicines for anxiety or sleep -certain medicines for seizures like carbamazepine, phenobarbital, phenytoin -certain medicines for movement abnormalities as in Parkinson's disease, or for gastrointestinal problems -epinephrine -medicines for allergies or colds -muscle relaxants -narcotic medicines for pain -other medicines that prolong the QT interval (cause an abnormal heart rhythm) -tramadol -trimethobenzamide This list may not describe all possible interactions. Give your health care provider a list of all the medicines, herbs, non-prescription drugs, or dietary supplements you use. Also tell them if you smoke, drink alcohol, or use illegal drugs. Some items may interact with your medicine. What should I watch for while using this medicine? Tell your doctor or health care professional if your symptoms do not start to get better in 1 to 2 days. You may get drowsy or dizzy. Do not drive, use machinery, or do anything that needs mental alertness until you know how this medicine affects  you. To reduce the risk of dizzy or fainting spells, do not stand or sit up quickly, especially if you are an older patient. Alcohol may increase dizziness and drowsiness. Avoid alcoholic drinks. Your mouth may get dry. Chewing sugarless gum or sucking hard candy, and drinking plenty of water may help. Contact your doctor if the problem does not go away or is severe. This medicine may cause dry eyes and blurred vision. If you wear contact lenses you may feel some discomfort. Lubricating drops may help. See your eye doctor if the problem does not go away or is severe. This medicine can make you more sensitive to the sun. Keep out of the sun. If you cannot avoid being in the sun, wear protective clothing and use sunscreen. Do not use sun lamps or tanning beds/booths. If you are diabetic, check your blood-sugar levels regularly. What side effects may I notice from receiving this medicine? Side effects that you should report to your doctor or health care professional as soon as possible: -blurred vision -irregular heartbeat, palpitations or chest pain -muscle or facial twitches -pain or difficulty passing urine -seizures -skin rash -slowed or shallow breathing -unusual bleeding or bruising -yellowing of the eyes or skin Side effects that usually do not require medical attention (  report to your doctor or health care professional if they continue or are bothersome): -headache -nightmares, agitation, nervousness, excitability, not able to sleep (these are more likely in children) -stuffy nose This list may not describe all possible side effects. Call your doctor for medical advice about side effects. You may report side effects to FDA at 1-800-FDA-1088. Where should I keep my medicine? Keep out of the reach of children. Store at room temperature, between 20 and 25 degrees C (68 and 77 degrees F). Protect from light. Throw away any unused medicine after the expiration date. NOTE: This sheet is a  summary. It may not cover all possible information. If you have questions about this medicine, talk to your doctor, pharmacist, or health care provider.  2015, [employer]. (2013-01-09 15:04:46)

## 2014-11-21 NOTE — ED Provider Notes (Signed)
CSN: [id number]     Arrival date & time 11/20/14  2333 History  This chart was scribed for Chris Boozeavid Betrice Wanat, MD by Tanda RockersMargaux Venter, ED Scribe. This patient was seen in room D33C/D33C and the patient's care was started at 3:05 AM.  Chief Complaint  Patient presents with  . Abdominal Pain   The history is provided by the patient. No language interpreter was used.     HPI Comments: Chris Randall is a 39 y.o. male with hx esophageal varices and GERD who presents to the Emergency Department complaining of sudden onset LUQ abdominal pain x 1 day, gradually worsening today. He describes the pain as sharp and stabbing in sensation. He rates it as a 10/10 on the pain scale. Pt states that the pain radiates into the left lower back and down to pelvis. The pain is exacerbated with movement of left side of his body.  Pt also complains of productive cough, nausea, vomiting, diarrhea, and diaphoresis. Pt states that he feels mildly better after vomiting or having a bowel movement. He has never had symptoms like this in the past. Pt mentions that he has been out of his Protonix medication for approximately 2 weeks.Denies constipation, fever, chills, or any other symptoms. Pt is current everyday smoker who smokes approximately 0.5 ppd. Denies EtOH usage.  PCP - None  Past Medical History  Diagnosis Date  . Asthma   . Bronchitis   . Esophageal varices    Past Surgical History  Procedure Laterality Date  . Shoulder surgery    . Rotator cuff repair  1995   Family History  Problem Relation Age of Onset  . Hypertension Other   . Cancer Other    History  Substance Use Topics  . Smoking status: Current Every Day Smoker -- 0.00 packs/day    Types: Cigarettes  . Smokeless tobacco: Current User     Comment: 3 cigarettes a day  . Alcohol Use: Yes     Comment: occasional    Review of Systems  Constitutional: Positive for diaphoresis. Negative for fever and chills.  Respiratory: Positive for cough.    Gastrointestinal: Positive for nausea, vomiting and diarrhea. Negative for constipation.  Musculoskeletal: Positive for back pain.  All other systems reviewed and are negative.  Allergies  Morphine and related and Vicodin  Home Medications   Prior to Admission medications   Medication Sig Start Date End Date Taking? Authorizing Provider  albuterol (PROVENTIL HFA;VENTOLIN HFA) 108 (90 BASE) MCG/ACT inhaler Inhale 2 puffs into the lungs every 6 (six) hours as needed (wheezing). Wheezing/shortness of breath.    Historical Provider, MD  albuterol-ipratropium (COMBIVENT) 18-103 MCG/ACT inhaler Inhale 1 puff into the lungs every 4 (four) hours as needed for wheezing or shortness of breath.     Historical Provider, MD  calcium carbonate (TUMS - DOSED IN MG ELEMENTAL CALCIUM) 500 MG chewable tablet Chew 4 tablets by mouth 4 (four) times daily as needed for indigestion or heartburn.    Historical Provider, MD  diazepam (VALIUM) 5 MG tablet Take 1 tablet (5 mg total) by mouth 2 (two) times daily. Patient not taking: Reported on 10/20/2014 08/19/14   Junius FinnerErin O'Malley, PA-C  diphenhydrAMINE (BENADRYL) 25 mg capsule Take 1 capsule (25 mg total) by mouth every 6 (six) hours as needed for itching. 10/20/14   Derwood KaplanAnkit Nanavati, MD  HYDROcodone-acetaminophen (NORCO/VICODIN) 5-325 MG per tablet Take 2 tablets by mouth every 4 (four) hours as needed. Patient not taking: Reported on 08/19/2014 08/05/14  Joycie Peek, PA-C  ibuprofen (ADVIL,MOTRIN) 600 MG tablet Take 1 tablet (600 mg total) by mouth every 6 (six) hours as needed. Patient not taking: Reported on 10/20/2014 08/19/14   Junius Finner, PA-C  Menthol-Methyl Salicylate (ICY HOT BALM EXTRA STRENGTH) 7.6-29 % OINT Apply 1 application topically as needed (pain). Patient not taking: Reported on 10/20/2014 08/19/14   Junius Finner, PA-C  methocarbamol (ROBAXIN) 500 MG tablet Take 1 tablet (500 mg total) by mouth 2 (two) times daily. Patient not taking: Reported on  08/05/2014 06/08/14   Fayrene Helper, PA-C  naproxen (NAPROSYN) 500 MG tablet Take 1 tablet (500 mg total) by mouth 2 (two) times daily. 10/20/14   Derwood Kaplan, MD  ondansetron (ZOFRAN ODT) 8 MG disintegrating tablet Take 1 tablet (8 mg total) by mouth every 8 (eight) hours as needed for nausea. 10/20/14   Derwood Kaplan, MD  pantoprazole (PROTONIX) 20 MG tablet Take 20 mg by mouth 2 (two) times daily.     Historical Provider, MD  predniSONE (DELTASONE) 50 MG tablet Take 1 tablet (50 mg total) by mouth daily. 10/20/14   Derwood Kaplan, MD  traMADol (ULTRAM) 50 MG tablet Take 1 tablet (50 mg total) by mouth every 6 (six) hours as needed. Patient not taking: Reported on 10/20/2014 08/19/14   Junius Finner, PA-C   Triage Vitals: BP 122/60 mmHg  Pulse 80  Temp(Src) 97.6 F (36.4 C) (Oral)  Resp 20  SpO2 99%   Physical Exam  Constitutional: He is oriented to person, place, and time. He appears well-developed and well-nourished.  Appears to be in pain  HENT:  Head: Normocephalic and atraumatic.  Eyes: Conjunctivae and EOM are normal. Pupils are equal, round, and reactive to light.  Neck: Neck supple. No tracheal deviation present.  Cardiovascular: Normal rate, regular rhythm and normal heart sounds.   Pulmonary/Chest: Effort normal. No respiratory distress. He has wheezes.  Scattered wheezes  Abdominal: There is tenderness. There is no rebound and no guarding.  Moderate epigastric tenderness Bowel sounds decreased  Musculoskeletal: Normal range of motion.  Neurological: He is alert and oriented to person, place, and time.  Skin: Skin is warm and dry.  Psychiatric: He has a normal mood and affect. His behavior is normal.  Nursing note and vitals reviewed.   ED Course  Procedures (including critical care time)  DIAGNOSTIC STUDIES: Oxygen Saturation is 99% on RA, normal by my interpretation.    COORDINATION OF CARE: 3:10 AM-Discussed treatment plan which includes pain medication and Zofran  with pt at bedside and pt agreed to plan.   Labs Review Results for orders placed or performed during the hospital encounter of 11/21/14  CBC with Differential  Result Value Ref Range   WBC 9.3 4.0 - 10.5 K/uL   RBC 5.46 4.22 - 5.81 MIL/uL   Hemoglobin 15.4 13.0 - 17.0 g/dL   HCT 16.1 09.6 - 04.5 %   MCV 83.0 78.0 - 100.0 fL   MCH 28.2 26.0 - 34.0 pg   MCHC 34.0 30.0 - 36.0 g/dL   RDW 40.9 81.1 - 91.4 %   Platelets 280 150 - 400 K/uL   Neutrophils Relative % 59 43 - 77 %   Neutro Abs 5.4 1.7 - 7.7 K/uL   Lymphocytes Relative 30 12 - 46 %   Lymphs Abs 2.8 0.7 - 4.0 K/uL   Monocytes Relative 9 3 - 12 %   Monocytes Absolute 0.8 0.1 - 1.0 K/uL   Eosinophils Relative 1 0 - 5 %  Eosinophils Absolute 0.1 0.0 - 0.7 K/uL   Basophils Relative 1 0 - 1 %   Basophils Absolute 0.1 0.0 - 0.1 K/uL  Comprehensive metabolic panel  Result Value Ref Range   Sodium 136 135 - 145 mmol/L   Potassium 3.6 3.5 - 5.1 mmol/L   Chloride 100 (L) 101 - 111 mmol/L   CO2 27 22 - 32 mmol/L   Glucose, Bld 124 (H) 65 - 99 mg/dL   BUN 12 6 - 20 mg/dL   Creatinine, Ser 4.54 0.61 - 1.24 mg/dL   Calcium 8.5 (L) 8.9 - 10.3 mg/dL   Total Protein 6.5 6.5 - 8.1 g/dL   Albumin 3.8 3.5 - 5.0 g/dL   AST 21 15 - 41 U/L   ALT 16 (L) 17 - 63 U/L   Alkaline Phosphatase 95 38 - 126 U/L   Total Bilirubin 0.5 0.3 - 1.2 mg/dL   GFR calc non Af Amer >60 >60 mL/min   GFR calc Af Amer >60 >60 mL/min   Anion gap 9 5 - 15  Lipase, blood  Result Value Ref Range   Lipase 61 (H) 22 - 51 U/L   MDM   Final diagnoses:  Acute pancreatitis, unspecified pancreatitis type    Epigastric pain radiating to the back course him for possible pancreatitis. Patient denies current alcohol use. He is given IV fluids and hydromorphone for pain and ondansetron for nausea without relief. Lipase has come back slightly elevated at 61. Remainder of laboratory workup was unremarkable. He was given additional hydromorphone and metoclopramide might  still without relief. He was given hydromorphone plus and promethazine with some improvement. I discussed the diagnosis with the patient and he states he would like to try to manage him at home. He is discharged with prescription for promethazine and oxycodone have acetaminophen. His record on the current colonic control substance reporting website was reviewed and he has not had any narcotic prescriptions in the last 3 months.   I personally performed the services described in this documentation, which was scribed in my presence. The recorded information has been reviewed and is accurate.       Chris Booze, MD 11/21/14 [phone]

## 2014-11-21 NOTE — ED Notes (Signed)
Pt notified of need for urine specimen. 

## 2014-11-21 NOTE — ED Notes (Signed)
Informed pt of need for urine sample.

## 2014-11-21 NOTE — ED Notes (Signed)
Pt attempted to obtain urine specimen but was unable to do so at this time.

## 2014-11-21 NOTE — ED Notes (Signed)
Pt aware of need for urine specimen. 

## 2014-11-21 NOTE — ED Notes (Signed)
MD at bedside. 

## 2014-11-27 ENCOUNTER — Encounter (HOSPITAL_COMMUNITY): Payer: Self-pay | Admitting: Emergency Medicine

## 2014-11-27 ENCOUNTER — Emergency Department (HOSPITAL_COMMUNITY)
Admission: EM | Admit: 2014-11-27 | Discharge: 2014-11-28 | Disposition: A | Discharge status: Home / Self Care | Payer: Medicaid Other | Attending: Emergency Medicine | Admitting: Emergency Medicine

## 2014-11-27 ENCOUNTER — Emergency Department (HOSPITAL_COMMUNITY): Payer: Medicaid Other

## 2014-11-27 DIAGNOSIS — R195 Other fecal abnormalities: Secondary | ICD-10-CM | POA: Insufficient documentation

## 2014-11-27 DIAGNOSIS — K5900 Constipation, unspecified: Secondary | ICD-10-CM | POA: Insufficient documentation

## 2014-11-27 DIAGNOSIS — M545 Low back pain, unspecified: Secondary | ICD-10-CM

## 2014-11-27 DIAGNOSIS — Z720 Tobacco use: Secondary | ICD-10-CM | POA: Insufficient documentation

## 2014-11-27 DIAGNOSIS — Z79899 Other long term (current) drug therapy: Secondary | ICD-10-CM | POA: Insufficient documentation

## 2014-11-27 DIAGNOSIS — Z8679 Personal history of other diseases of the circulatory system: Secondary | ICD-10-CM | POA: Insufficient documentation

## 2014-11-27 DIAGNOSIS — J45909 Unspecified asthma, uncomplicated: Secondary | ICD-10-CM | POA: Insufficient documentation

## 2014-11-27 DIAGNOSIS — R112 Nausea with vomiting, unspecified: Secondary | ICD-10-CM | POA: Insufficient documentation

## 2014-11-27 DIAGNOSIS — R109 Unspecified abdominal pain: Secondary | ICD-10-CM

## 2014-11-27 LAB — COMPREHENSIVE METABOLIC PANEL
ALK PHOS: 91 U/L (ref 38–126)
ALT: 13 U/L — AB (ref 17–63)
AST: 17 U/L (ref 15–41)
Albumin: 4.4 g/dL (ref 3.5–5.0)
Anion gap: 8 (ref 5–15)
BUN: 14 mg/dL (ref 6–20)
CO2: 24 mmol/L (ref 22–32)
Calcium: 9 mg/dL (ref 8.9–10.3)
Chloride: 104 mmol/L (ref 101–111)
Creatinine, Ser: 0.96 mg/dL (ref 0.61–1.24)
GFR calc non Af Amer: >60 mL/min (ref >60)
Glucose, Bld: 111 mg/dL — ABNORMAL HIGH (ref 65–99)
Potassium: 3.8 mmol/L (ref 3.5–5.1)
SODIUM: 136 mmol/L (ref 135–145)
TOTAL PROTEIN: 7.3 g/dL (ref 6.5–8.1)

## 2014-11-27 LAB — CBC WITH DIFFERENTIAL/PLATELET
BASOS ABS: 0 10*3/uL (ref 0.0–0.1)
Basophils Relative: 1 % (ref 0–1)
EOS ABS: 0.1 10*3/uL (ref 0.0–0.7)
Eosinophils Relative: 1 % (ref 0–5)
HCT: 47.9 % (ref 39.0–52.0)
Hemoglobin: 16 g/dL (ref 13.0–17.0)
Lymphocytes Relative: 22 % (ref 12–46)
Lymphs Abs: 1.8 10*3/uL (ref 0.7–4.0)
MCH: 28.4 pg (ref 26.0–34.0)
MCHC: 33.4 g/dL (ref 30.0–36.0)
MCV: 84.9 fL (ref 78.0–100.0)
Monocytes Absolute: 0.6 10*3/uL (ref 0.1–1.0)
Monocytes Relative: 7 % (ref 3–12)
NEUTROS ABS: 5.5 10*3/uL (ref 1.7–7.7)
NEUTROS PCT: 69 % (ref 43–77)
Platelets: 301 10*3/uL (ref 150–400)
RBC: 5.64 MIL/uL (ref 4.22–5.81)
RDW: 12.8 % (ref 11.5–15.5)
WBC: 8 10*3/uL (ref 4.0–10.5)

## 2014-11-27 LAB — POC OCCULT BLOOD, ED: Fecal Occult Bld: NEGATIVE

## 2014-11-27 LAB — LIPASE, BLOOD: Lipase: 18 U/L — ABNORMAL LOW (ref 22–51)

## 2014-11-27 MED ORDER — ONDANSETRON 4 MG PO TBDP
4.0000 mg | ORAL_TABLET | Freq: Once | ORAL | Status: AC
  Administered 2014-11-27: 4 mg via ORAL
  Filled 2014-11-27: qty 1

## 2014-11-27 MED ORDER — ONDANSETRON 8 MG PO TBDP
8.0000 mg | ORAL_TABLET | Freq: Once | ORAL | Status: AC
  Administered 2014-11-27: 8 mg via ORAL
  Filled 2014-11-27: qty 1

## 2014-11-27 MED ORDER — GI COCKTAIL ~~LOC~~
30.0000 mL | Freq: Once | ORAL | Status: AC
  Administered 2014-11-27: 30 mL via ORAL
  Filled 2014-11-27: qty 30

## 2014-11-27 MED ORDER — DICYCLOMINE HCL 10 MG/ML IM SOLN
20.0000 mg | Freq: Once | INTRAMUSCULAR | Status: AC
  Administered 2014-11-28: 20 mg via INTRAMUSCULAR
  Filled 2014-11-27: qty 2

## 2014-11-27 NOTE — ED Notes (Signed)
Pt c/o left quadrant abdominal pain radiating to back and left leg. [facility] and diagnosed with acute pancreatitis. Patient reports this is unrelated to drinking alcohol and only drinks occasionally. No other c/c. Has had nausea and vomiting consistently. No other c/c.

## 2014-11-27 NOTE — ED Notes (Signed)
Pt states that he was seen at [facility] and dx with pancreatitis. He was taking prescribed pain medication which was making his pain and vomiting more tolerable, but is now out of his medications.

## 2014-11-27 NOTE — ED Notes (Signed)
MD at bedside. 

## 2014-11-28 LAB — URINALYSIS, ROUTINE W REFLEX MICROSCOPIC
BILIRUBIN URINE: NEGATIVE
Glucose, UA: NEGATIVE mg/dL
HGB URINE DIPSTICK: NEGATIVE
KETONES UR: NEGATIVE mg/dL
Leukocytes, UA: NEGATIVE
NITRITE: NEGATIVE
Protein, ur: NEGATIVE mg/dL
SPECIFIC GRAVITY, URINE: 1.031 — AB (ref 1.005–1.030)
UROBILINOGEN UA: 0.2 mg/dL (ref 0.0–1.0)
pH: 6 (ref 5.0–8.0)

## 2014-11-28 MED ORDER — METHOCARBAMOL 500 MG PO TABS: 1000.0000 mg | ORAL_TABLET | Freq: Three times a day (TID) | ORAL | Status: DC | PRN

## 2014-11-28 MED ORDER — DICYCLOMINE HCL 20 MG PO TABS: 20.0000 mg | ORAL_TABLET | Freq: Two times a day (BID) | ORAL | Status: DC

## 2014-11-28 MED ORDER — ONDANSETRON 4 MG PO TBDP: ORAL_TABLET | ORAL | Status: DC

## 2014-11-28 MED ORDER — DOCUSATE SODIUM 100 MG PO CAPS: 100.0000 mg | ORAL_CAPSULE | Freq: Two times a day (BID) | ORAL | Status: DC

## 2014-11-28 MED ORDER — POLYETHYLENE GLYCOL 3350 17 G PO PACK: 17.0000 g | PACK | Freq: Every day | ORAL | Status: DC

## 2014-11-28 NOTE — ED Notes (Signed)
Pt called out inquiring he wanted his d/c paperwork. Went by pt room and told him as soon as his papers were available I would bring them in. "just bring me something to sign, I don't want to wait". Discussed with pt I would not have anything to sign until paperwork complete. Pt ambulated out of room towards lobby with steady gait.

## 2014-11-28 NOTE — ED Provider Notes (Signed)
CSN: [id number]     Arrival date & time 11/27/14  1903 History   First MD Initiated Contact with Patient 11/27/14 2303     Chief Complaint  Patient presents with  . Nausea  . Emesis  . Known Pancreatitis      (Consider location/radiation/quality/duration/timing/severity/associated sxs/prior Treatment) HPI Patient presents with greater than 1 week of stabbing abdominal pain. The pain is mostly in the left upper quadrant and radiates to his back. He has had some nausea with occasional episodes of vomiting. States he had a small loose bowel movement earlier today. Questionable blood in the stool. Was seen last week for possible pancreatitis with mild elevation in lipase. States this pain is similar. Recent CT abdomen with no acute findings.  Past Medical History  Diagnosis Date  . Asthma   . Bronchitis   . Esophageal varices    Past Surgical History  Procedure Laterality Date  . Shoulder surgery    . Rotator cuff repair  1995   Family History  Problem Relation Age of Onset  . Hypertension Other   . Cancer Other    History  Substance Use Topics  . Smoking status: Current Every Day Smoker -- 0.00 packs/day    Types: Cigarettes  . Smokeless tobacco: Current User     Comment: 3 cigarettes a day  . Alcohol Use: Yes     Comment: occasional    Review of Systems  Constitutional: Negative for fever and chills.  Respiratory: Negative for shortness of breath.   Cardiovascular: Negative for chest pain.  Gastrointestinal: Positive for nausea, abdominal pain and blood in stool. Negative for vomiting, diarrhea, constipation and abdominal distention.  Genitourinary: Positive for flank pain. Negative for dysuria, frequency, hematuria and difficulty urinating.  Musculoskeletal: Positive for myalgias and back pain. Negative for neck pain and neck stiffness.  Skin: Negative for rash and wound.  Neurological: Negative for dizziness, weakness, light-headedness, numbness and headaches.       Allergies  Morphine and related and Vicodin  Home Medications   Prior to Admission medications   Medication Sig Start Date End Date Taking? Authorizing Provider  oxyCODONE-acetaminophen (PERCOCET) 5-325 MG per tablet Take 1 tablet by mouth every 4 (four) hours as needed for moderate pain. 11/21/14  Yes Dione Boozeavid Glick, MD  promethazine (PHENERGAN) 25 MG tablet Take 1 tablet (25 mg total) by mouth every 6 (six) hours as needed for nausea or vomiting. 11/21/14  Yes Dione Boozeavid Glick, MD  diazepam (VALIUM) 5 MG tablet Take 1 tablet (5 mg total) by mouth 2 (two) times daily. Patient not taking: Reported on 10/20/2014 08/19/14   Junius FinnerErin O'Malley, PA-C  dicyclomine (BENTYL) 20 MG tablet Take 1 tablet (20 mg total) by mouth 2 (two) times daily. 11/28/14   Loren Raceravid Tangie Stay, MD  diphenhydrAMINE (BENADRYL) 25 mg capsule Take 1 capsule (25 mg total) by mouth every 6 (six) hours as needed for itching. Patient not taking: Reported on 11/27/2014 10/20/14   Derwood KaplanAnkit Nanavati, MD  docusate sodium (COLACE) 100 MG capsule Take 1 capsule (100 mg total) by mouth every 12 (twelve) hours. 11/28/14   Loren Raceravid Rylann Munford, MD  HYDROcodone-acetaminophen (NORCO/VICODIN) 5-325 MG per tablet Take 2 tablets by mouth every 4 (four) hours as needed. Patient not taking: Reported on 08/19/2014 08/05/14   Joycie PeekBenjamin Cartner, PA-C  ibuprofen (ADVIL,MOTRIN) 600 MG tablet Take 1 tablet (600 mg total) by mouth every 6 (six) hours as needed. Patient not taking: Reported on 10/20/2014 08/19/14   Junius FinnerErin O'Malley, PA-C  Menthol-Methyl Salicylate (ICY  HOT BALM EXTRA STRENGTH) 7.6-29 % OINT Apply 1 application topically as needed (pain). Patient not taking: Reported on 10/20/2014 08/19/14   Junius Finner, PA-C  methocarbamol (ROBAXIN) 500 MG tablet Take 1 tablet (500 mg total) by mouth 2 (two) times daily. Patient not taking: Reported on 08/05/2014 06/08/14   Fayrene Helper, PA-C  methocarbamol (ROBAXIN) 500 MG tablet Take 2 tablets (1,000 mg total) by mouth every 8 (eight)  hours as needed for muscle spasms. 11/28/14   Loren Racer, MD  naproxen (NAPROSYN) 500 MG tablet Take 1 tablet (500 mg total) by mouth 2 (two) times daily. Patient not taking: Reported on 11/21/2014 10/20/14   Derwood Kaplan, MD  ondansetron (ZOFRAN ODT) 4 MG disintegrating tablet  ODT q4 hours prn nausea/vomit 11/28/14   Loren Racer, MD  ondansetron (ZOFRAN ODT) 8 MG disintegrating tablet Take 1 tablet (8 mg total) by mouth every 8 (eight) hours as needed for nausea. Patient not taking: Reported on 11/21/2014 10/20/14   Derwood Kaplan, MD  polyethylene glycol (MIRALAX / GLYCOLAX) packet Take 17 g by mouth daily. 11/28/14   Loren Racer, MD  predniSONE (DELTASONE) 50 MG tablet Take 1 tablet (50 mg total) by mouth daily. Patient not taking: Reported on 11/27/2014 10/20/14   Derwood Kaplan, MD  traMADol (ULTRAM) 50 MG tablet Take 1 tablet (50 mg total) by mouth every 6 (six) hours as needed. Patient not taking: Reported on 10/20/2014 08/19/14   Junius Finner, PA-C   BP 130/71 mmHg  Pulse 82  Temp(Src) 98.3 F (36.8 C) (Oral)  Resp 16  SpO2 97% Physical Exam  Constitutional: He is oriented to person, place, and time. He appears well-developed and well-nourished. No distress.  HENT:  Head: Normocephalic and atraumatic.  Mouth/Throat: Oropharynx is clear and moist.  Eyes: EOM are normal. Pupils are equal, round, and reactive to light.  Neck: Normal range of motion. Neck supple.  Cardiovascular: Normal rate and regular rhythm.   Pulmonary/Chest: Effort normal and breath sounds normal. No respiratory distress. He has no wheezes. He has no rales.  Abdominal: Soft. Bowel sounds are normal. He exhibits no distension and no mass. There is tenderness (ttenderness to palpation in the epigastric region, left upper quadrant and left lower quadrants.). There is no rebound and no guarding.  Musculoskeletal: Normal range of motion. He exhibits tenderness. He exhibits no edema.  Tenderness to palpation  along the left paraspinal lumbar musculature. No definite CVA tenderness. No lower extremity swelling or pain. Distal pulses intact.  Neurological: He is alert and oriented to person, place, and time.  5/5 motor in all extremities. Sensation is fully intact.  Skin: Skin is warm and dry. No rash noted. No erythema.  Psychiatric: He has a normal mood and affect. His behavior is normal.  Nursing note and vitals reviewed.   ED Course  Procedures (including critical care time) Labs Review Labs Reviewed  COMPREHENSIVE METABOLIC PANEL - Abnormal; Notable for the following:    Glucose, Bld 111 (*)    ALT 13 (*)    Total Bilirubin <0.1 (*)    All other components within normal limits  LIPASE, BLOOD - Abnormal; Notable for the following:    Lipase 18 (*)    All other components within normal limits  URINALYSIS, ROUTINE W REFLEX MICROSCOPIC (NOT AT [facility]) - Abnormal; Notable for the following:    Specific Gravity, Urine 1.031 (*)    All other components within normal limits  CBC WITH DIFFERENTIAL/PLATELET  OCCULT BLOOD X 1 CARD TO  LAB, STOOL  POC OCCULT BLOOD, ED    Imaging Review Dg Abd Acute W/chest  11/27/2014   CLINICAL DATA:  39 year old male with left-sided abdominal pain.  EXAM: DG ABDOMEN ACUTE W/ 1V CHEST  COMPARISON:  CT dated 10/20/2014  FINDINGS: Constipation. There is no evidence of dilated bowel loops or free intraperitoneal air. No radiopaque calculi or other significant radiographic abnormality is seen. Heart size and mediastinal contours are within normal limits. Both lungs are clear.  IMPRESSION: Constipation.  No bowel obstruction.  No radiopaque calculi.   Electronically Signed   By: Elgie Collard M.D.   On: 11/27/2014 23:33     EKG Interpretation None      MDM   Final diagnoses:  Abdominal pain  Constipation, unspecified constipation type  Left-sided low back pain without sciatica   No vomiting in the emergency department. Abdominal exam is benign. Plain x-rays  concerning for constipation. Patient been seen multiple times for various pain related complaints. Review of his x-rays and CT scans. Most recent this CT scan was just over a month ago. There is no acute findings. Patient likely has narcotic induced constipation. There also may be a component of gastritis. Patient states he has known gastritis and is on Protonix. 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 Patient left exam was normal. Lipase has decreased since he was seen on 6/30. I have very low suspicion that this is due to pancreatitis. Did not wait for prescriptions or for paperwork before leaving the emergency department.     Loren Racer, MD 11/28/14 [phone]

## 2014-11-28 NOTE — Discharge Instructions (Signed)
Abdominal Pain °Many things can cause abdominal pain. Usually, abdominal pain is not caused by a disease and will improve without treatment. It can often be observed and treated at home. Your health care provider will do a physical exam and possibly order blood tests and X-rays to help determine the seriousness of your pain. However, in many cases, more time must pass before a clear cause of the pain can be found. Before that point, your health care provider may not know if you need more testing or further treatment. °HOME CARE INSTRUCTIONS  °Monitor your abdominal pain for any changes. The following actions may help to alleviate any discomfort you are experiencing: °· Only take over-the-counter or prescription medicines as directed by your health care provider. °· Do not take laxatives unless directed to do so by your health care provider. °· Try a clear liquid diet (broth, tea, or water) as directed by your health care provider. Slowly move to a bland diet as tolerated. °SEEK MEDICAL CARE IF: °· You have unexplained abdominal pain. °· You have abdominal pain associated with nausea or diarrhea. °· You have pain when you urinate or have a bowel movement. °· You experience abdominal pain that wakes you in the night. °· You have abdominal pain that is worsened or improved by eating food. °· You have abdominal pain that is worsened with eating fatty foods. °· You have a fever. °SEEK IMMEDIATE MEDICAL CARE IF:  °· Your pain does not go away within 2 hours. °· You keep throwing up (vomiting). °· Your pain is felt only in portions of the abdomen, such as the right side or the left lower portion of the abdomen. °· You pass bloody or black tarry stools. °MAKE SURE YOU: °· Understand these instructions.   °· Will watch your condition.   °· Will get help right away if you are not doing well or get worse.   °Document Released: 02/17/2005 Document Revised: 05/15/2013 Document Reviewed: 01/17/2013 °ExitCare® Patient Information  ©2015 [employer]. This information is not intended to replace advice given to you by your health care provider. Make sure you discuss any questions you have with your health care provider. ° °Constipation °Constipation is when a person has fewer than three bowel movements a week, has difficulty having a bowel movement, or has stools that are dry, hard, or larger than normal. As people grow older, constipation is more common. If you try to fix constipation with medicines that make you have a bowel movement (laxatives), the problem may get worse. Long-term laxative use may cause the muscles of the colon to become weak. A low-fiber diet, not taking in enough fluids, and taking certain medicines may make constipation worse.  °CAUSES  °· Certain medicines, such as antidepressants, pain medicine, iron supplements, antacids, and water pills.   °· Certain diseases, such as diabetes, irritable bowel syndrome (IBS), thyroid disease, or depression.   °· Not drinking enough water.   °· Not eating enough fiber-rich foods.   °· Stress or travel.   °· Lack of physical activity or exercise.   °· Ignoring the urge to have a bowel movement.   °· Using laxatives too much.   °SIGNS AND SYMPTOMS  °· Having fewer than three bowel movements a week.   °· Straining to have a bowel movement.   °· Having stools that are hard, dry, or larger than normal.   °· Feeling full or bloated.   °· Pain in the lower abdomen.   °· Not feeling relief after having a bowel movement.   °DIAGNOSIS  °Your health care provider will take   a medical history and perform a physical exam. Further testing may be done for severe constipation. Some tests may include:  A barium enema X-ray to examine your rectum, colon, and, sometimes, your small intestine.   A sigmoidoscopy to examine your lower colon.   A colonoscopy to examine your entire colon. TREATMENT  Treatment will depend on the severity of your constipation and what is causing it. Some dietary  treatments include drinking more fluids and eating more fiber-rich foods. Lifestyle treatments may include regular exercise. If these diet and lifestyle recommendations do not help, your health care provider may recommend taking over-the-counter laxative medicines to help you have bowel movements. Prescription medicines may be prescribed if over-the-counter medicines do not work.  HOME CARE INSTRUCTIONS   Eat foods that have a lot of fiber, such as fruits, vegetables, whole grains, and beans.  Limit foods high in fat and processed sugars, such as french fries, hamburgers, cookies, candies, and soda.   A fiber supplement may be added to your diet if you cannot get enough fiber from foods.   Drink enough fluids to keep your urine clear or pale yellow.   Exercise regularly or as directed by your health care provider.   Go to the restroom when you have the urge to go. Do not hold it.   Only take over-the-counter or prescription medicines as directed by your health care provider. Do not take other medicines for constipation without talking to your health care provider first.  SEEK IMMEDIATE MEDICAL CARE IF:   You have bright red blood in your stool.   Your constipation lasts for more than 4 days or gets worse.   You have abdominal or rectal pain.   You have thin, pencil-like stools.   You have unexplained weight loss. MAKE SURE YOU:   Understand these instructions.  Will watch your condition.  Will get help right away if you are not doing well or get worse. Document Released: 02/06/2004 Document Revised: 05/15/2013 Document Reviewed: 02/19/2013 [facility] Patient Information 2015 [place], [place]. This information is not intended to replace advice given to you by your health care provider. Make sure you discuss any questions you have with your health care provider.  Chronic Back Pain  When back pain lasts longer than 3 months, it is called chronic back pain.People with  chronic back pain often go through certain periods that are more intense (flare-ups).  CAUSES Chronic back pain can be caused by wear and tear (degeneration) on different structures in your back. These structures include:  The bones of your spine (vertebrae) and the joints surrounding your spinal cord and nerve roots (facets).  The strong, fibrous tissues that connect your vertebrae (ligaments). Degeneration of these structures may result in pressure on your nerves. This can lead to constant pain. HOME CARE INSTRUCTIONS  Avoid bending, heavy lifting, prolonged sitting, and activities which make the problem worse.  Take brief periods of rest throughout the day to reduce your pain. Lying down or standing usually is better than sitting while you are resting.  Take over-the-counter or prescription medicines only as directed by your caregiver. SEEK IMMEDIATE MEDICAL CARE IF:   You have weakness or numbness in one of your legs or feet.  You have trouble controlling your bladder or bowels.  You have nausea, vomiting, abdominal pain, shortness of breath, or fainting. Document Released: 06/17/2004 Document Revised: 08/02/2011 Document Reviewed: 04/24/2011 [facility] Patient Information 2015 [place], [place]. This information is not intended to replace advice given to you  by your health care provider. Make sure you discuss any questions you have with your health care provider. ° °

## 2015-03-25 DIAGNOSIS — I861 Scrotal varices: Secondary | ICD-10-CM

## 2015-03-25 HISTORY — DX: Scrotal varices: I86.1

## 2015-04-15 ENCOUNTER — Encounter (HOSPITAL_COMMUNITY): Payer: Self-pay

## 2015-04-15 ENCOUNTER — Emergency Department (HOSPITAL_COMMUNITY)
Admission: EM | Admit: 2015-04-15 | Discharge: 2015-04-16 | Disposition: A | Discharge status: Home / Self Care | Payer: Self-pay | Attending: Emergency Medicine | Admitting: Emergency Medicine

## 2015-04-15 DIAGNOSIS — R1031 Right lower quadrant pain: Secondary | ICD-10-CM | POA: Insufficient documentation

## 2015-04-15 DIAGNOSIS — Z8679 Personal history of other diseases of the circulatory system: Secondary | ICD-10-CM | POA: Insufficient documentation

## 2015-04-15 DIAGNOSIS — J45909 Unspecified asthma, uncomplicated: Secondary | ICD-10-CM | POA: Insufficient documentation

## 2015-04-15 DIAGNOSIS — N50811 Right testicular pain: Secondary | ICD-10-CM | POA: Insufficient documentation

## 2015-04-15 DIAGNOSIS — R109 Unspecified abdominal pain: Secondary | ICD-10-CM

## 2015-04-15 DIAGNOSIS — R112 Nausea with vomiting, unspecified: Secondary | ICD-10-CM | POA: Insufficient documentation

## 2015-04-15 DIAGNOSIS — Z79899 Other long term (current) drug therapy: Secondary | ICD-10-CM | POA: Insufficient documentation

## 2015-04-15 DIAGNOSIS — F17210 Nicotine dependence, cigarettes, uncomplicated: Secondary | ICD-10-CM | POA: Insufficient documentation

## 2015-04-15 LAB — COMPREHENSIVE METABOLIC PANEL
ALT: 17 U/L (ref 17–63)
AST: 27 U/L (ref 15–41)
Albumin: 4.1 g/dL (ref 3.5–5.0)
Alkaline Phosphatase: 107 U/L (ref 38–126)
Anion gap: 7 (ref 5–15)
BILIRUBIN TOTAL: 0.7 mg/dL (ref 0.3–1.2)
BUN: 11 mg/dL (ref 6–20)
CO2: 26 mmol/L (ref 22–32)
Calcium: 9.2 mg/dL (ref 8.9–10.3)
Chloride: 103 mmol/L (ref 101–111)
Creatinine, Ser: 0.93 mg/dL (ref 0.61–1.24)
GFR calc Af Amer: >60 mL/min (ref >60)
Glucose, Bld: 112 mg/dL — ABNORMAL HIGH (ref 65–99)
Potassium: 4.2 mmol/L (ref 3.5–5.1)
Sodium: 136 mmol/L (ref 135–145)
TOTAL PROTEIN: 6.7 g/dL (ref 6.5–8.1)

## 2015-04-15 LAB — CBC
HCT: 45.5 % (ref 39.0–52.0)
Hemoglobin: 15.4 g/dL (ref 13.0–17.0)
MCH: 29.3 pg (ref 26.0–34.0)
MCHC: 33.8 g/dL (ref 30.0–36.0)
MCV: 86.5 fL (ref 78.0–100.0)
PLATELETS: 290 10*3/uL (ref 150–400)
RBC: 5.26 MIL/uL (ref 4.22–5.81)
RDW: 13.4 % (ref 11.5–15.5)
WBC: 8.6 10*3/uL (ref 4.0–10.5)

## 2015-04-15 LAB — LIPASE, BLOOD: Lipase: 32 U/L (ref 11–51)

## 2015-04-15 NOTE — ED Notes (Signed)
Patient c/o RLQ pain x 2days - worsening over the last few hours.  Patient states he has pain radiating to right thigh and right testicle.  Patient also c/o nausea without vomiting.  Denies difficulty urinating or moving bowels.

## 2015-04-16 ENCOUNTER — Emergency Department (HOSPITAL_COMMUNITY): Payer: Self-pay

## 2015-04-16 ENCOUNTER — Emergency Department (HOSPITAL_COMMUNITY): Payer: Medicaid Other

## 2015-04-16 LAB — URINALYSIS, ROUTINE W REFLEX MICROSCOPIC
Bilirubin Urine: NEGATIVE
GLUCOSE, UA: NEGATIVE mg/dL
HGB URINE DIPSTICK: NEGATIVE
KETONES UR: NEGATIVE mg/dL
Leukocytes, UA: NEGATIVE
Nitrite: NEGATIVE
PROTEIN: NEGATIVE mg/dL
Specific Gravity, Urine: 1.008 (ref 1.005–1.030)
pH: 6 (ref 5.0–8.0)

## 2015-04-16 MED ORDER — KETOROLAC TROMETHAMINE 30 MG/ML IJ SOLN
30.0000 mg | Freq: Once | INTRAMUSCULAR | Status: AC
  Administered 2015-04-16: 30 mg via INTRAVENOUS
  Filled 2015-04-16: qty 1

## 2015-04-16 MED ORDER — FENTANYL CITRATE (PF) 100 MCG/2ML IJ SOLN
100.0000 ug | Freq: Once | INTRAMUSCULAR | Status: AC
  Administered 2015-04-16: 100 ug via INTRAVENOUS
  Filled 2015-04-16: qty 2

## 2015-04-16 MED ORDER — OXYCODONE-ACETAMINOPHEN 5-325 MG PO TABS
1.0000 | ORAL_TABLET | Freq: Four times a day (QID) | ORAL | Status: DC | PRN
Start: 2015-04-16 — End: 2015-05-08

## 2015-04-16 MED ORDER — DIPHENHYDRAMINE HCL 50 MG/ML IJ SOLN
12.5000 mg | Freq: Once | INTRAMUSCULAR | Status: AC
  Administered 2015-04-16: 12.5 mg via INTRAVENOUS
  Filled 2015-04-16: qty 1

## 2015-04-16 MED ORDER — OXYCODONE HCL 5 MG PO TABS
5.0000 mg | ORAL_TABLET | ORAL | Status: AC
  Administered 2015-04-16: 5 mg via ORAL
  Filled 2015-04-16: qty 1

## 2015-04-16 MED ORDER — FENTANYL CITRATE (PF) 100 MCG/2ML IJ SOLN
50.0000 ug | Freq: Once | INTRAMUSCULAR | Status: AC
  Administered 2015-04-16: 50 ug via INTRAVENOUS
  Filled 2015-04-16: qty 2

## 2015-04-16 MED ORDER — SODIUM CHLORIDE 0.9 % IV SOLN
Freq: Once | INTRAVENOUS | Status: AC
  Administered 2015-04-16: via INTRAVENOUS

## 2015-04-16 MED ORDER — ONDANSETRON HCL 4 MG/2ML IJ SOLN
4.0000 mg | Freq: Once | INTRAMUSCULAR | Status: AC
  Administered 2015-04-16: 4 mg via INTRAVENOUS
  Filled 2015-04-16: qty 2

## 2015-04-16 NOTE — ED Notes (Signed)
2nd time informing the pt that a urine specimen is needed.  Pt stated he "would try after half of his fluid bag was finished".

## 2015-04-16 NOTE — ED Notes (Signed)
Informed the pt that a urine specimen is needed. 

## 2015-04-16 NOTE — ED Provider Notes (Signed)
CSN: [id number]     Arrival date & time 04/15/15  2219 History   First MD Initiated Contact with Patient 04/16/15 0006     Chief Complaint  Patient presents with  . Abdominal Pain     (Consider location/radiation/quality/duration/timing/severity/associated sxs/prior Treatment) HPI Comments: 2 day of intermittent RLQ pain waxing and waning now worsening  Developed nausea and vomiting today   Took Phenergan with relief for several hours   Patient is a 39 y.o. male presenting with abdominal pain. The history is provided by the patient.  Abdominal Pain Pain location:  RLQ Pain quality: aching and sharp   Pain radiates to:  Groin and scrotum Pain severity:  Moderate Onset quality:  Gradual Duration:  2 days Progression:  Waxing and waning Chronicity:  New Relieved by:  Nothing Worsened by:  Movement and palpation Ineffective treatments:  None tried Associated symptoms: nausea and vomiting   Associated symptoms: no diarrhea, no dysuria, no fever and no shortness of breath     Past Medical History  Diagnosis Date  . Asthma   . Bronchitis   . Esophageal varices (HCC)    Past Surgical History  Procedure Laterality Date  . Shoulder surgery    . Rotator cuff repair  1995   Family History  Problem Relation Age of Onset  . Hypertension Other   . Cancer Other    Social History  Substance Use Topics  . Smoking status: Current Every Day Smoker -- 0.50 packs/day    Types: Cigarettes  . Smokeless tobacco: Current User     Comment: 3 cigarettes a day  . Alcohol Use: Yes     Comment: occasional    Review of Systems  Constitutional: Negative for fever.  Respiratory: Negative for shortness of breath.   Gastrointestinal: Positive for nausea, vomiting and abdominal pain. Negative for diarrhea.  Genitourinary: Positive for testicular pain. Negative for dysuria, frequency, penile swelling and scrotal swelling.  All other systems reviewed and are negative.     Allergies    Morphine and related and Vicodin  Home Medications   Prior to Admission medications   Medication Sig Start Date End Date Taking? Authorizing Provider  albuterol (PROVENTIL HFA;VENTOLIN HFA) 108 (90 BASE) MCG/ACT inhaler Inhale 1-2 puffs into the lungs every 6 (six) hours as needed for wheezing or shortness of breath.   Yes Historical Provider, MD  promethazine (PHENERGAN) 25 MG tablet Take 1 tablet (25 mg total) by mouth every 6 (six) hours as needed for nausea or vomiting. 11/21/14  Yes Dione Booze, MD  dicyclomine (BENTYL) 20 MG tablet Take 1 tablet (20 mg total) by mouth 2 (two) times daily. Patient not taking: Reported on 04/15/2015 11/28/14   Loren Racer, MD  methocarbamol (ROBAXIN) 500 MG tablet Take 2 tablets (1,000 mg total) by mouth every 8 (eight) hours as needed for muscle spasms. Patient not taking: Reported on 04/15/2015 11/28/14   Loren Racer, MD  ondansetron (ZOFRAN ODT) 4 MG disintegrating tablet  ODT q4 hours prn nausea/vomit Patient not taking: Reported on 04/15/2015 11/28/14   Loren Racer, MD  oxyCODONE-acetaminophen (PERCOCET/ROXICET) 5-325 MG tablet Take 1 tablet by mouth every 6 (six) hours as needed for severe pain. 04/16/15   Earley Favor, NP  polyethylene glycol (MIRALAX / GLYCOLAX) packet Take 17 g by mouth daily. Patient not taking: Reported on 04/15/2015 11/28/14   Loren Racer, MD  predniSONE (DELTASONE) 50 MG tablet Take 1 tablet (50 mg total) by mouth daily. Patient not taking: Reported on 11/27/2014 10/20/14  Derwood KaplanAnkit Nanavati, MD   BP 119/74 mmHg  Pulse 67  Temp(Src) 97.5 F (36.4 C) (Oral)  Resp 16  Ht 5\' 4"  (1.626 m)  Wt 79.379 kg  BMI 30.02 kg/m2  SpO2 97% Physical Exam  Constitutional: He appears well-developed and well-nourished.  HENT:  Head: Normocephalic.  Eyes: Pupils are equal, round, and reactive to light.  Neck: Normal range of motion.  Cardiovascular: Normal rate and regular rhythm.   Pulmonary/Chest: Effort normal.  Abdominal:  Soft. There is tenderness in the right lower quadrant and suprapubic area. Hernia confirmed negative in the right inguinal area and confirmed negative in the left inguinal area.    Genitourinary: Penis normal. Right testis shows no mass, no swelling and no tenderness. Left testis shows no mass, no swelling and no tenderness. No penile tenderness.  Musculoskeletal: Normal range of motion.  Neurological: He is alert.  Skin: Skin is warm.  Nursing note and vitals reviewed.   ED Course  Procedures (including critical care time) Labs Review Labs Reviewed  COMPREHENSIVE METABOLIC PANEL - Abnormal; Notable for the following:    Glucose, Bld 112 (*)    All other components within normal limits  URINALYSIS, ROUTINE W REFLEX MICROSCOPIC ([facility]) - Abnormal; Notable for the following:    APPearance CLOUDY (*)    All other components within normal limits  LIPASE, BLOOD  CBC    Imaging Review [place] Scrotum  04/16/2015  CLINICAL DATA:  Initial valuation for acute right lower quadrant pain for 2 days, radiating into right testicle. EXAM: ULTRASOUND OF SCROTUM TECHNIQUE: Complete ultrasound examination of the testicles, epididymis, and other scrotal structures was performed. COMPARISON:  Prior CT from the same day. FINDINGS: Right testicle Measurements: 4.2 x 2.4 x 2.7 cm. No mass or microlithiasis visualized. Left testicle Measurements: 4.7 x 2.0 x 2.6 cm. No mass or microlithiasis visualized. Right epididymis:  Normal in size and appearance. Left epididymis:  Normal in size and appearance. Hydrocele: Small left hydro subtle with minimal internal echogenicity. Varicocele:  Bilateral varicoceles, left larger than right. IMPRESSION: 1. Normal sonographic appearance of the testes. 2. Small bilateral varicoceles, left larger than right. 3. Small left hydrocele. Electronically Signed   By: Rise MuBenjamin  McClintock M.D.   On: 04/16/2015 04:45   Ct Renal Stone Study  04/16/2015  CLINICAL DATA:  Right groin  pain radiating to the right testis. Pain and burning with urination. No hematuria. EXAM: CT ABDOMEN AND PELVIS WITHOUT CONTRAST TECHNIQUE: Multidetector CT imaging of the abdomen and pelvis was performed following the standard protocol without IV contrast. COMPARISON:  10/20/2014 FINDINGS: Atelectasis in the lung bases. Kidneys are symmetrical in size and shape. No hydronephrosis or hydroureter. No renal, ureteral, or bladder stones. No bladder wall thickening. The unenhanced appearance of the liver, spleen, gallbladder, pancreas, adrenal glands, abdominal aorta, inferior vena cava, and retroperitoneal lymph nodes is unremarkable. Small accessory spleen. Stomach, small bowel, and colon are mostly decompressed. No free air or free fluid in the abdomen. Abdominal wall musculature appears intact. Pelvis: The appendix is normal. Prostate gland is not enlarged but contains calcifications. Rectosigmoid colon is unremarkable. No destructive bone lesions. IMPRESSION: No renal or ureteral stone or obstruction. No acute process demonstrated in the abdomen or pelvis on unenhanced imaging. Electronically Signed   By: Burman NievesWilliam  Stevens M.D.   On: 04/16/2015 03:39   I have personally reviewed and evaluated these images and lab results as part of my medical decision-making.   EKG Interpretation None    Ct  scan and ultrasound normal labs and UA normal no definitive dx at this. Will DC home with antiemetic and pain medication  Will try Oxycodone in the ED due to medication allergies   MDM   Final diagnoses:  Abdominal wall pain in left flank  Testicular pain, right         Earley Favor, NP 04/16/15 0555  Earley Favor, NP 04/16/15 0556  April Palumbo, MD 04/16/15 [id number]

## 2015-04-16 NOTE — ED Notes (Signed)
Pt requesting Benadryl; provider notified. No acute distress.

## 2015-04-16 NOTE — Discharge Instructions (Signed)
Abdominal Pain, Adult Many things can cause belly (abdominal) pain. Most times, the belly pain is not dangerous. Many cases of belly pain can be watched and treated at home. HOME CARE   Do not take medicines that help you go poop (laxatives) unless told to by your doctor.  Only take medicine as told by your doctor.  Eat or drink as told by your doctor. Your doctor will tell you if you should be on a special diet. GET HELP IF:  You do not know what is causing your belly pain.  You have belly pain while you are sick to your stomach (nauseous) or have runny poop (diarrhea).  You have pain while you pee or poop.  Your belly pain wakes you up at night.  You have belly pain that gets worse or better when you eat.  You have belly pain that gets worse when you eat fatty foods.  You have a fever. GET HELP RIGHT AWAY IF:   The pain does not go away within 2 hours.  You keep throwing up (vomiting).  The pain changes and is only in the right or left part of the belly.  You have bloody or tarry looking poop. MAKE SURE YOU:   Understand these instructions.  Will watch your condition.  Will get help right away if you are not doing well or get worse.   This information is not intended to replace advice given to you by your health care provider. Make sure you discuss any questions you have with your health care provider.   Document Released: 10/27/2007 Document Revised: 05/31/2014 Document Reviewed: 01/17/2013 Else[employer] Interactive Patient Education [employer] Today your examined for abdominal pain and testicular pain.-year-old, and CT scans are both normal you do not have an elevated white count.   Your Urine is normal as well.  U been given a prescription for Percocet and referral to urology if needed if he develops new or worsening symptoms such as fever, diarrhea, difficulty urinating, vomiting, please return for further evaluation

## 2015-04-24 ENCOUNTER — Emergency Department (HOSPITAL_COMMUNITY): Payer: Medicaid Other

## 2015-04-24 ENCOUNTER — Emergency Department (HOSPITAL_COMMUNITY)
Admission: EM | Admit: 2015-04-24 | Discharge: 2015-04-24 | Disposition: A | Discharge status: Home / Self Care | Payer: Self-pay | Attending: Emergency Medicine | Admitting: Emergency Medicine

## 2015-04-24 DIAGNOSIS — Y998 Other external cause status: Secondary | ICD-10-CM | POA: Insufficient documentation

## 2015-04-24 DIAGNOSIS — S60221A Contusion of right hand, initial encounter: Secondary | ICD-10-CM | POA: Insufficient documentation

## 2015-04-24 DIAGNOSIS — Y9289 Other specified places as the place of occurrence of the external cause: Secondary | ICD-10-CM | POA: Insufficient documentation

## 2015-04-24 DIAGNOSIS — R197 Diarrhea, unspecified: Secondary | ICD-10-CM | POA: Insufficient documentation

## 2015-04-24 DIAGNOSIS — W2209XA Striking against other stationary object, initial encounter: Secondary | ICD-10-CM | POA: Insufficient documentation

## 2015-04-24 DIAGNOSIS — R1011 Right upper quadrant pain: Secondary | ICD-10-CM | POA: Insufficient documentation

## 2015-04-24 DIAGNOSIS — J45909 Unspecified asthma, uncomplicated: Secondary | ICD-10-CM | POA: Insufficient documentation

## 2015-04-24 DIAGNOSIS — Z8719 Personal history of other diseases of the digestive system: Secondary | ICD-10-CM | POA: Insufficient documentation

## 2015-04-24 DIAGNOSIS — R109 Unspecified abdominal pain: Secondary | ICD-10-CM

## 2015-04-24 DIAGNOSIS — F17210 Nicotine dependence, cigarettes, uncomplicated: Secondary | ICD-10-CM | POA: Insufficient documentation

## 2015-04-24 DIAGNOSIS — Z79899 Other long term (current) drug therapy: Secondary | ICD-10-CM | POA: Insufficient documentation

## 2015-04-24 DIAGNOSIS — Y9389 Activity, other specified: Secondary | ICD-10-CM | POA: Insufficient documentation

## 2015-04-24 DIAGNOSIS — G8929 Other chronic pain: Secondary | ICD-10-CM | POA: Insufficient documentation

## 2015-04-24 LAB — RAPID URINE DRUG SCREEN, HOSP PERFORMED
AMPHETAMINES: NOT DETECTED
BARBITURATES: NOT DETECTED
BENZODIAZEPINES: NOT DETECTED
COCAINE: NOT DETECTED
OPIATES: NOT DETECTED
TETRAHYDROCANNABINOL: NOT DETECTED

## 2015-04-24 LAB — COMPREHENSIVE METABOLIC PANEL
ALBUMIN: 4.4 g/dL (ref 3.5–5.0)
ALT: 16 U/L — ABNORMAL LOW (ref 17–63)
ANION GAP: 5 (ref 5–15)
AST: 20 U/L (ref 15–41)
Alkaline Phosphatase: 89 U/L (ref 38–126)
BUN: 11 mg/dL (ref 6–20)
CO2: 28 mmol/L (ref 22–32)
Calcium: 9.4 mg/dL (ref 8.9–10.3)
Chloride: 103 mmol/L (ref 101–111)
Creatinine, Ser: 0.93 mg/dL (ref 0.61–1.24)
GFR calc non Af Amer: >60 mL/min (ref >60)
GLUCOSE: 103 mg/dL — AB (ref 65–99)
POTASSIUM: 4.2 mmol/L (ref 3.5–5.1)
SODIUM: 136 mmol/L (ref 135–145)
TOTAL PROTEIN: 7.5 g/dL (ref 6.5–8.1)
Total Bilirubin: 0.6 mg/dL (ref 0.3–1.2)

## 2015-04-24 LAB — URINALYSIS, ROUTINE W REFLEX MICROSCOPIC
BILIRUBIN URINE: NEGATIVE
Glucose, UA: NEGATIVE mg/dL
Hgb urine dipstick: NEGATIVE
Ketones, ur: NEGATIVE mg/dL
LEUKOCYTES UA: NEGATIVE
NITRITE: NEGATIVE
PH: 7.5 (ref 5.0–8.0)
Protein, ur: NEGATIVE mg/dL
SPECIFIC GRAVITY, URINE: 1.014 (ref 1.005–1.030)

## 2015-04-24 LAB — CBC WITH DIFFERENTIAL/PLATELET
BASOS PCT: 1 %
Basophils Absolute: 0.1 10*3/uL (ref 0.0–0.1)
EOS ABS: 0.1 10*3/uL (ref 0.0–0.7)
Eosinophils Relative: 1 %
HEMATOCRIT: 49.8 % (ref 39.0–52.0)
HEMOGLOBIN: 16.9 g/dL (ref 13.0–17.0)
LYMPHS ABS: 1.3 10*3/uL (ref 0.7–4.0)
Lymphocytes Relative: 14 %
MCH: 29.2 pg (ref 26.0–34.0)
MCHC: 33.9 g/dL (ref 30.0–36.0)
MCV: 86 fL (ref 78.0–100.0)
MONOS PCT: 12 %
Monocytes Absolute: 1.1 10*3/uL — ABNORMAL HIGH (ref 0.1–1.0)
NEUTROS ABS: 6.4 10*3/uL (ref 1.7–7.7)
NEUTROS PCT: 72 %
Platelets: 267 10*3/uL (ref 150–400)
RBC: 5.79 MIL/uL (ref 4.22–5.81)
RDW: 13.3 % (ref 11.5–15.5)
WBC: 8.9 10*3/uL (ref 4.0–10.5)

## 2015-04-24 LAB — LIPASE, BLOOD: Lipase: 26 U/L (ref 11–51)

## 2015-04-24 MED ORDER — SODIUM CHLORIDE 0.9 % IV SOLN
1000.0000 mL | Freq: Once | INTRAVENOUS | Status: AC
Start: 2015-04-24 — End: 2015-04-24
  Administered 2015-04-24: 1000 mL via INTRAVENOUS

## 2015-04-24 MED ORDER — METOCLOPRAMIDE HCL 5 MG/ML IJ SOLN
10.0000 mg | Freq: Once | INTRAMUSCULAR | Status: AC
  Administered 2015-04-24: 10 mg via INTRAVENOUS
  Filled 2015-04-24: qty 2

## 2015-04-24 MED ORDER — METOCLOPRAMIDE HCL 10 MG PO TABS: 10.0000 mg | ORAL_TABLET | Freq: Four times a day (QID) | ORAL | Status: DC

## 2015-04-24 MED ORDER — PANTOPRAZOLE SODIUM 20 MG PO TBEC: 20.0000 mg | DELAYED_RELEASE_TABLET | Freq: Every day | ORAL | Status: DC

## 2015-04-24 MED ORDER — DIPHENHYDRAMINE HCL 50 MG/ML IJ SOLN
25.0000 mg | Freq: Once | INTRAMUSCULAR | Status: AC
  Administered 2015-04-24: 25 mg via INTRAVENOUS
  Filled 2015-04-24: qty 1

## 2015-04-24 NOTE — Discharge Instructions (Signed)
Abdominal Pain, Adult Many things can cause abdominal pain. Usually, abdominal pain is not caused by a disease and will improve without treatment. It can often be observed and treated at home. Your health care provider will do a physical exam and possibly order blood tests and X-rays to help determine the seriousness of your pain. However, in many cases, more time must pass before a clear cause of the pain can be found. Before that point, your health care provider may not know if you need more testing or further treatment. HOME CARE INSTRUCTIONS Monitor your abdominal pain for any changes. The following actions may help to alleviate any discomfort you are experiencing:  Only take over-the-counter or prescription medicines as directed by your health care provider.  Do not take laxatives unless directed to do so by your health care provider.  Try a clear liquid diet (broth, tea, or water) as directed by your health care provider. Slowly move to a bland diet as tolerated. SEEK MEDICAL CARE IF:  You have unexplained abdominal pain.  You have abdominal pain associated with nausea or diarrhea.  You have pain when you urinate or have a bowel movement.  You experience abdominal pain that wakes you in the night.  You have abdominal pain that is worsened or improved by eating food.  You have abdominal pain that is worsened with eating fatty foods.  You have a fever. SEEK IMMEDIATE MEDICAL CARE IF:  Your pain does not go away within 2 hours.  You keep throwing up (vomiting).  Your pain is felt only in portions of the abdomen, such as the right side or the left lower portion of the abdomen.  You pass bloody or black tarry stools. MAKE SURE YOU:  Understand these instructions.  Will watch your condition.  Will get help right away if you are not doing well or get worse.   This information is not intended to replace advice given to you by your health care provider. Make sure you discuss  any questions you have with your health care provider.   Document Released: 02/17/2005 Document Revised: 01/29/2015 Document Reviewed: 01/17/2013 Elsevier Interactive Patient Education 2016 Elsevier Inc. Possible Gastritis, Adult Gastritis is soreness and swelling (inflammation) of the lining of the stomach. Gastritis can develop as a sudden onset (acute) or long-term (chronic) condition. If gastritis is not treated, it can lead to stomach bleeding and ulcers. CAUSES  Gastritis occurs when the stomach lining is weak or damaged. Digestive juices from the stomach then inflame the weakened stomach lining. The stomach lining may be weak or damaged due to viral or bacterial infections. One common bacterial infection is the Helicobacter pylori infection. Gastritis can also result from excessive alcohol consumption, taking certain medicines, or having too much acid in the stomach.  SYMPTOMS  In some cases, there are no symptoms. When symptoms are present, they may include:  Pain or a burning sensation in the upper abdomen.  Nausea.  Vomiting.  An uncomfortable feeling of fullness after eating. DIAGNOSIS  Your caregiver may suspect you have gastritis based on your symptoms and a physical exam. To determine the cause of your gastritis, your caregiver may perform the following:  Blood or stool tests to check for the H pylori bacterium.  Gastroscopy. A thin, flexible tube (endoscope) is passed down the esophagus and into the stomach. The endoscope has a light and camera on the end. Your caregiver uses the endoscope to view the inside of the stomach.  Taking a tissue sample (  biopsy) from the stomach to examine under a microscope. TREATMENT  Depending on the cause of your gastritis, medicines may be prescribed. If you have a bacterial infection, such as an H pylori infection, antibiotics may be given. If your gastritis is caused by too much acid in the stomach, H2 blockers or antacids may be given.  Your caregiver may recommend that you stop taking aspirin, ibuprofen, or other nonsteroidal anti-inflammatory drugs (NSAIDs). HOME CARE INSTRUCTIONS  Only take over-the-counter or prescription medicines as directed by your caregiver.  If you were given antibiotic medicines, take them as directed. Finish them even if you start to feel better.  Drink enough fluids to keep your urine clear or pale yellow.  Avoid foods and drinks that make your symptoms worse, such as:  Caffeine or alcoholic drinks.  Chocolate.  Peppermint or mint flavorings.  Garlic and onions.  Spicy foods.  Citrus fruits, such as oranges, lemons, or limes.  Tomato-based foods such as sauce, chili, salsa, and pizza.  Fried and fatty foods.  Eat small, frequent meals instead of large meals. SEEK IMMEDIATE MEDICAL CARE IF:   You have black or dark red stools.  You vomit blood or material that looks like coffee grounds.  You are unable to keep fluids down.  Your abdominal pain gets worse.  You have a fever.  You do not feel better after 1 week.  You have any other questions or concerns. MAKE SURE YOU:  Understand these instructions.  Will watch your condition.  Will get help right away if you are not doing well or get worse.   This information is not intended to replace advice given to you by your health care provider. Make sure you discuss any questions you have with your health care provider.   Document Released: 05/04/2001 Document Revised: 11/09/2011 Document Reviewed: 06/23/2011 Elsevier Interactive Patient Education 2016 [employer].   Emergency Department Resource Guide 1) Find a Doctor and Pay Out of Pocket Although you won't have to find out who is covered by your insurance plan, it is a good idea to ask around and get recommendations. You will then need to call the office and see if the doctor you have chosen will accept you as a new patient and what types of options they offer for  patients who are self-pay. Some doctors offer discounts or will set up payment plans for their patients who do not have insurance, but you will need to ask so you aren't surprised when you get to your appointment.  2) Contact Your Local Health Department Not all health departments have doctors that can see patients for sick visits, but many do, so it is worth a call to see if yours does. If you don't know where your local health department is, you can check in your phone book. The CDC also has a tool to help you locate your state's health department, and many state websites also have listings of all of their local health departments.  3) Find a Walk-in Clinic If your illness is not likely to be very severe or complicated, you may want to try a walk in clinic. These are popping up all over the country in pharmacies, drugstores, and shopping centers. They're usually staffed by nurse practitioners or physician assistants that have been trained to treat common illnesses and complaints. They're usually fairly quick and inexpensive. However, if you have serious medical issues or chronic medical problems, these are probably not your best option.  No Primary Care Doctor: -  Call Health Connect at  [phone] - they can help you locate a primary care doctor that  accepts your insurance, provides certain services, etc. - Physician Referral Service- [phone]  Chronic Pain Problems: Organization         Address  Phone   Notes  [facility]  [phone] Patients need to be referred by their primary care doctor.   Medication Assistance: Organization         Address  Phone   Notes  [facility] Medication [facility] [address] [place] [place], [place] [address] [phone] --Must be a resident of [facility] -- Must have NO insurance coverage whatsoever (no Medicaid/ Medicare, etc.) -- The pt. MUST have a primary care doctor that directs their care regularly  and follows them in the community   [facility]ist  [phone]   [employer]  [phone]    Agencies that provide inexpensive medical care: Organization         Address  Phone   Notes  [facility]  [phone]   [facility]    [phone]   [facility] [address] [place], [place] [address] [phone]   [facility] [address] [place] [place] [phone]   Planned Parenthood    [phone]   [facility]    [phone]   Community [facility] and [facility]  [address], [place] Phone:  [phone], Fax:  [phone] Hours of Operation:  9 am - 6 pm, M-F.  Also accepts Medicaid/Medicare and self-pay.  [facility]  [address], [place] Phone: [phone], Fax: [phone]. Hours of Operation:  8:30 am - 5:30 pm, M-F.  Also accepts Medicaid and self-pay.  [facility] [place] [address] [place] Phone: [phone]   [facility] [address] Natasha Bence [place], [place] [phone], Ext. [phone] Mondays & Thursdays: 7-9 AM.  First 15 patients are seen on a first come, first serve basis.    Medicaid-accepting [facility] Providers:  Organization         Address  Phone   Notes  [facility] [address], [place] [phone] Also accepts self-pay patients.  [facility] [address] Laurell Josephs [place], [place]  [phone]   [facility] [address], [place] [phone]   [facility] [address] [place] [phone]   Renaye Rakers [address], [place]   [phone] Only accepts [place] Access [place] patients after they have their name applied to their card.   Self-Pay (no insurance) in [facility]:  Organization         Address  Phone   Notes  Sickle  Cell Patients, [facility] Internal Medicine [address] [place], [place] [phone]   [facility]gent Care [address] [place], [place] [phone]   [facility] [place]  [address], [place] [phone]   [facility]/Dr. Osei-Bonsu  [address], [place] or [address], [place] [phone] Phone number for both [place] and [place] locations is the same.  [facility]gent Medical and [facility] [address], [place]in[place] Otto [phone]   [facility] [place]  [address] [place] [address], [place] or [address] [phone]   [facility] [address] [place], [place] [phone]) [phone], phone; [phone], fax Sees patients 1st and 3rd Saturday of every month.  Must not qualify for public or private insurance (i.e. Medicaid, Medicare, [place] Health Choice, Veterans' Benefits)  Household income should be no more than 200% of the poverty level The clinic cannot treat you if you are pregnant or think you are pregnant  Sexually transmitted diseases are not treated at the clinic.    Dental Care: Organization         Address  Phone  Notes  [facility] [address], [place] [phone] Accepts children up to age 38 who are enrolled in [place] or [place] Health Choice; pregnant women with a Medicaid card; and children who have applied for Medicaid or [place] Health Choice, but were declined, whose parents can pay a reduced fee at time of service.  [facility] [place]  [address], [place] [phone] Accepts children up to age 421 who are enrolled in [place] or [place] Health Choice; pregnant women with a Medicaid card; and children who have applied for Medicaid or [place] Health Choice, but were declined, whose parents can pay a reduced fee at time of service.  [facility]  [address], [place] [phone] Patients are seen by appointment only. Walk-ins are not accepted. [facility] will see patients 38 years of age and older. Monday - Tuesday (8am-5pm) Most Wednesdays (8:30-5pm) $30 per visit, cash only  [facility]  [address], [facility] [phone] Patients are seen by appointment only. Walk-ins are not accepted. [facility] will see patients 38 years of age and older. One Wednesday Evening (Monthly: Volunteer Based).  $30 per visit, cash only  [facility]  [phone] for adults; Children under age 184, call Graduate Pediatric Dentistry at [phone]) [phone]. Children aged 604-14, please call [phone] to request a pediatric application.  Dental services are provided in all areas of dental care including fillings, crowns and bridges, complete and partial dentures, implants, gum treatment, root canals, and extractions. Preventive care is also provided. Treatment is provided to both adults and children. Patients are selected via a lottery and there is often a waiting list.   [facility] [address], [place]  [phone]) [phone] [email or web address]   [facility] [address], [place], [place] [phone], Ext. [phone] Second and Fourth Thursday of each month, opens at 6:30 AM; Clinic ends at 9 AM.  Patients are seen on a first-come first-served basis, and a limited number are seen during each clinic.   [facility]  [address], [place], [place] [phone]) [phone]   Eligibility Requirements You must have lived in [place], [place], or [place] counties for at least the last three months.   You cannot be eligible for state or federal sponsored [place]healthcare insurance, including CIGNAVeterans Administration, [place]icaid, or [employer]icare.   You generally cannot be eligible for healthcare insurance through your employer.    How to apply: Eligibility  screenings are held every Tuesday and Wednesday afternoon from 1:00 pm until 4:00 pm. You do not need an appointment for the interview!  [facility] [address], [place], [place] [phone]   [facility]  [phone]   [facility] Health Department  [phone]   [facility]  [phone]    Behavioral Health Resources in the Community: Intensive Outpatient Programs Organization         Address  Phone  Notes  [facility] Services [address] [place], [place] [phone]   [facility] Outpatient [address], [place], [place] [phone]   [facility] [address] [place], [place]  [phone]   [facility] Mental Health [address],  [place], [place] [phone] or [phone]   Substance Abuse Resources Organization         Address  Phone  Notes  [facility] Drug Services  2020621331   [facility]  [phone]   [place]  [phone]   Floydene Flock  [phone]   Residential & Outpatient Substance Abuse Program  [phone]   Psychological Services Organization         Address  Phone  Notes  [facility] Behavioral Health  [phone]   [facility] Services  [phone]   [facility] Mental Health [address] [place] [phone] or [phone]    Mobile Crisis Teams Organization         Address  Phone  Notes  [facility], Mobile Crisis Care Unit  [phone]   [facility]  [address] [place], [place] [phone]   Doristine Locks [address] [place] [phone]    Self-Help/Support Groups Organization         Address  Phone             Notes  [facility] [place] - variety of support groups  336- I7437963 Call for more information  Narcotics Anonymous (NA), [facility] [address], [employer] [place]  2 meetings at  this location   Statistician         Address  Phone  Notes  [facility] [address],    [place]  [phone]   [facility]  [address] [place] [address], [place], [place] [phone]   [facility] Treatment Facility [address] [place], [place] [phone] Admissions: 8am-3pm M-F  [facility] [address],    [place], [place] [phone]   [facility] [address] [place], [place], [place] [phone]   [facility] [address],  [place], [place] [phone]   [facility] - Intensive Outpatient [address]ure[address] Josephs [address], [place], [place] [phone]   [facility] [address] [place]  [place], [place] [phone] or [phone]   [facility] ([facility]) [address], [place], [place] [phone] Accepts Medicaid  [facility] [address],  [place] [phone] Substance Abuse/Addiction Treatment   [facility] Organization         Address  Phone  Notes  [facility]  [phone]   Angie Fava, PhD [address] Ervin Knack [place], [place]   [phone] or [phone]   [facility]   [address] [place], [place] [phone]   [facility] [address] [place], [place] [phone] Insurance/Medicaid/sponsorship through [employer] and Families [address]  [place], [place] [phone] [place], [place] [phone]    Dr. Adele Schilder  [phone]   [facility] Dept. 1) [address] [place] 2) [place] 3)  [place] [address], [place] [phone]  [phone]   [place] [phone] or [phone] (After Hours)

## 2015-04-24 NOTE — ED Provider Notes (Signed)
CSN: [id number]     Arrival date & time 04/24/15  1027 History   First MD Initiated Contact with Patient 04/24/15 1103     Chief Complaint  Patient presents with  . Abdominal Pain  . Diarrhea     (Consider location/radiation/quality/duration/timing/severity/associated sxs/prior Treatment) HPI Patient reports right mid quadrant pain sharp in nature radiating to his groin. He reports as been going on since a week ago. Is not improved. He reports 3 episodes of diarrhea this morning. He endorses nausea but has not had active vomiting. He does report chills. He denies urinary symptoms of pain burning or urgency. Past Medical History  Diagnosis Date  . Asthma   . Bronchitis   . Esophageal varices (HCC)    Past Surgical History  Procedure Laterality Date  . Shoulder surgery    . Rotator cuff repair  1995   Family History  Problem Relation Age of Onset  . Hypertension Other   . Cancer Other    Social History  Substance Use Topics  . Smoking status: Current Every Day Smoker -- 0.50 packs/day    Types: Cigarettes  . Smokeless tobacco: Current User     Comment: 3 cigarettes a day  . Alcohol Use: Yes     Comment: occasional    Review of Systems  10 Systems reviewed and are negative for acute change except as noted in the HPI.   Allergies  Morphine and related and Vicodin  Home Medications   Prior to Admission medications   Medication Sig Start Date End Date Taking? Authorizing Provider  albuterol (PROVENTIL HFA;VENTOLIN HFA) 108 (90 BASE) MCG/ACT inhaler Inhale 1-2 puffs into the lungs every 6 (six) hours as needed for wheezing or shortness of breath.   Yes Historical Provider, MD  oxyCODONE-acetaminophen (PERCOCET/ROXICET) 5-325 MG tablet Take 1 tablet by mouth every 6 (six) hours as needed for severe pain. 04/16/15  Yes Earley FavorGail Schulz, NP  dicyclomine (BENTYL) 20 MG tablet Take 1 tablet (20 mg total) by mouth 2 (two) times daily. Patient not taking: Reported on 04/24/2015  11/28/14   Loren Raceravid Yelverton, MD  methocarbamol (ROBAXIN) 500 MG tablet Take 2 tablets (1,000 mg total) by mouth every 8 (eight) hours as needed for muscle spasms. Patient not taking: Reported on 04/24/2015 11/28/14   Loren Raceravid Yelverton, MD  metoCLOPramide (REGLAN) 10 MG tablet Take 1 tablet (10 mg total) by mouth every 6 (six) hours. 04/24/15   Arby BarretteMarcy Leonila Speranza, MD  ondansetron (ZOFRAN ODT) 4 MG disintegrating tablet 4mg  ODT q4 hours prn nausea/vomit Patient not taking: Reported on 04/24/2015 11/28/14   Loren Raceravid Yelverton, MD  pantoprazole (PROTONIX) 20 MG tablet Take 1 tablet (20 mg total) by mouth daily. 04/24/15   Arby BarretteMarcy Dontavius Keim, MD  polyethylene glycol (MIRALAX / GLYCOLAX) packet Take 17 g by mouth daily. Patient not taking: Reported on 04/24/2015 11/28/14   Loren Raceravid Yelverton, MD  promethazine (PHENERGAN) 25 MG tablet Take 1 tablet (25 mg total) by mouth every 6 (six) hours as needed for nausea or vomiting. Patient not taking: Reported on 04/24/2015 11/21/14   Dione Boozeavid Glick, MD   BP 132/95 mmHg  Pulse 89  Temp(Src) 97.9 F (36.6 C) (Oral)  Resp 16  Ht 5\' 4"  (1.626 m)  Wt 175 lb (79.379 kg)  BMI 30.02 kg/m2  SpO2 99% Physical Exam  Constitutional: He is oriented to person, place, and time. He appears well-developed and well-nourished.  As I walk in the room, the patient is now grimacing and holding his right lateral quadrant. He  is nontoxic in appearance. Patient has no respiratory distress. His color is good. He is otherwise well in appearance.  HENT:  Head: Normocephalic and atraumatic.  Eyes: EOM are normal. Pupils are equal, round, and reactive to light.  Neck: Neck supple.  Cardiovascular: Normal rate, regular rhythm, normal heart sounds and intact distal pulses.   Pulmonary/Chest: Effort normal and breath sounds normal.  Abdominal: Soft. Bowel sounds are normal. He exhibits no distension. There is tenderness.  Endorses right lateral quadrant tenderness. No guarding or rebound.  Musculoskeletal: Normal  range of motion. He exhibits no edema or tenderness.  Neurological: He is alert and oriented to person, place, and time. He has normal strength. Coordination normal. GCS eye subscore is 4. GCS verbal subscore is 5. GCS motor subscore is 6.  Skin: Skin is warm, dry and intact.    ED Course  Procedures (including critical care time) Labs Review Labs Reviewed  COMPREHENSIVE METABOLIC PANEL - Abnormal; Notable for the following:    Glucose, Bld 103 (*)    ALT 16 (*)    All other components within normal limits  CBC WITH DIFFERENTIAL/PLATELET - Abnormal; Notable for the following:    Monocytes Absolute 1.1 (*)    All other components within normal limits  LIPASE, BLOOD  URINALYSIS, ROUTINE W REFLEX MICROSCOPIC (NOT AT [facility])  URINE RAPID DRUG SCREEN, HOSP PERFORMED    Imaging Review No results found. I have personally reviewed and evaluated these images and lab results as part of my medical decision-making.   EKG Interpretation None      MDM   Final diagnoses:  Right upper quadrant pain  Chronic abdominal pain  Diarrhea, unspecified type   Patient presents with complaints of severe abdominal pain. At this time his vital signs are stable with out positive orthostatic vital signs. He is diagnostic workup is normal with normal CBC, CMP and urinalysis. Patient has had multiple CT scans and ultrasound done in the past for abdominal pain. At this time based on diagnostic studies, patient's clinical condition and vital signs, I do not feel that further imaging is indicated today. Patient has had chronic recurrent abdominal pain and at this point I will recommend daily use of protonix with Reglan and follow-up with family physician and possibly GI referral if indicated.    Arby Barrette, MD 04/24/15 [id number]

## 2015-04-24 NOTE — ED Notes (Addendum)
Pt was seen here for the same RLQ abd pain radiating to RT testicle about a week ago.  States pain has not resolved and now has n/v/d, on/off chills.  States he was hurting so bad he punched the floor with RT hand and has swelling an pain to knuckles.

## 2015-05-08 ENCOUNTER — Encounter (HOSPITAL_COMMUNITY): Payer: Self-pay | Admitting: Emergency Medicine

## 2015-05-08 ENCOUNTER — Emergency Department (HOSPITAL_COMMUNITY)
Admission: EM | Admit: 2015-05-08 | Discharge: 2015-05-08 | Disposition: A | Discharge status: Home / Self Care | Payer: Medicaid Other | Attending: Emergency Medicine | Admitting: Emergency Medicine

## 2015-05-08 DIAGNOSIS — F17210 Nicotine dependence, cigarettes, uncomplicated: Secondary | ICD-10-CM | POA: Insufficient documentation

## 2015-05-08 DIAGNOSIS — G8929 Other chronic pain: Secondary | ICD-10-CM | POA: Insufficient documentation

## 2015-05-08 DIAGNOSIS — J45909 Unspecified asthma, uncomplicated: Secondary | ICD-10-CM | POA: Insufficient documentation

## 2015-05-08 DIAGNOSIS — M5441 Lumbago with sciatica, right side: Secondary | ICD-10-CM

## 2015-05-08 DIAGNOSIS — Z8679 Personal history of other diseases of the circulatory system: Secondary | ICD-10-CM | POA: Insufficient documentation

## 2015-05-08 DIAGNOSIS — Z79899 Other long term (current) drug therapy: Secondary | ICD-10-CM | POA: Insufficient documentation

## 2015-05-08 MED ORDER — METHOCARBAMOL 500 MG PO TABS: 500.0000 mg | ORAL_TABLET | Freq: Two times a day (BID) | ORAL | Status: DC | PRN

## 2015-05-08 MED ORDER — KETOROLAC TROMETHAMINE 60 MG/2ML IM SOLN
60.0000 mg | Freq: Once | INTRAMUSCULAR | Status: AC
  Administered 2015-05-08: 60 mg via INTRAMUSCULAR
  Filled 2015-05-08: qty 2

## 2015-05-08 MED ORDER — DIAZEPAM 5 MG/ML IJ SOLN
2.5000 mg | Freq: Once | INTRAMUSCULAR | Status: AC
  Administered 2015-05-08: 2.5 mg via INTRAVENOUS
  Filled 2015-05-08: qty 2

## 2015-05-08 MED ORDER — PREDNISONE 20 MG PO TABS: 40.0000 mg | ORAL_TABLET | Freq: Every day | ORAL | Status: DC

## 2015-05-08 MED ORDER — PREDNISONE 20 MG PO TABS
60.0000 mg | ORAL_TABLET | Freq: Once | ORAL | Status: AC
  Administered 2015-05-08: 60 mg via ORAL
  Filled 2015-05-08: qty 3

## 2015-05-08 MED ORDER — OXYCODONE-ACETAMINOPHEN 5-325 MG PO TABS: 1.0000 | ORAL_TABLET | Freq: Three times a day (TID) | ORAL | Status: DC | PRN

## 2015-05-08 MED ORDER — PANTOPRAZOLE SODIUM 20 MG PO TBEC: 20.0000 mg | DELAYED_RELEASE_TABLET | Freq: Every day | ORAL | Status: DC

## 2015-05-08 NOTE — ED Notes (Signed)
Patient presents to ED with c/o lower back pain radiating down his right leg after helping a friend move and picking up a heavy dressed.  Pt able to ambulate independently

## 2015-05-08 NOTE — ED Notes (Signed)
Pt able to ambulate independently 

## 2015-05-08 NOTE — Discharge Instructions (Signed)
Chronic Back Pain ° When back pain lasts longer than 3 months, it is called chronic back pain. People with chronic back pain often go through certain periods that are more intense (flare-ups).  °CAUSES °Chronic back pain can be caused by wear and tear (degeneration) on different structures in your back. These structures include: °· The bones of your spine (vertebrae) and the joints surrounding your spinal cord and nerve roots (facets). °· The strong, fibrous tissues that connect your vertebrae (ligaments). °Degeneration of these structures may result in pressure on your nerves. This can lead to constant pain. °HOME CARE INSTRUCTIONS °· Avoid bending, heavy lifting, prolonged sitting, and activities which make the problem worse. °· Take brief periods of rest throughout the day to reduce your pain. Lying down or standing usually is better than sitting while you are resting. °· Take over-the-counter or prescription medicines only as directed by your caregiver. °SEEK IMMEDIATE MEDICAL CARE IF:  °· You have weakness or numbness in one of your legs or feet. °· You have trouble controlling your bladder or bowels. °· You have nausea, vomiting, abdominal pain, shortness of breath, or fainting. °  °This information is not intended to replace advice given to you by your health care provider. Make sure you discuss any questions you have with your health care provider. °  °Document Released: 06/17/2004 Document Revised: 08/02/2011 Document Reviewed: 10/28/2014 °Elsevier Interactive Patient Education ©2016 [employer] ° °Sciatica °Sciatica is pain, weakness, numbness, or tingling along the path of the sciatic nerve. The nerve starts in the lower back and runs down the back of each leg. The nerve controls the muscles in the lower leg and in the back of the knee, while also providing sensation to the back of the thigh, lower leg, and the sole of your foot. Sciatica is a symptom of another medical condition. For instance, nerve  damage or certain conditions, such as a herniated disk or bone spur on the spine, pinch or put pressure on the sciatic nerve. This causes the pain, weakness, or other sensations normally associated with sciatica. Generally, sciatica only affects one side of the body. °CAUSES  °· Herniated or slipped disc. °· Degenerative disk disease. °· A pain disorder involving the narrow muscle in the buttocks (piriformis syndrome). °· Pelvic injury or fracture. °· Pregnancy. °· Tumor (rare). °SYMPTOMS  °Symptoms can vary from mild to very severe. The symptoms usually travel from the low back to the buttocks and down the back of the leg. Symptoms can include: °· Mild tingling or dull aches in the lower back, leg, or hip. °· Numbness in the back of the calf or sole of the foot. °· Burning sensations in the lower back, leg, or hip. °· Sharp pains in the lower back, leg, or hip. °· Leg weakness. °· Severe back pain inhibiting movement. °These symptoms may get worse with coughing, sneezing, laughing, or prolonged sitting or standing. Also, being overweight may worsen symptoms. °DIAGNOSIS  °Your caregiver will perform a physical exam to look for common symptoms of sciatica. He or she may ask you to do certain movements or activities that would trigger sciatic nerve pain. Other tests may be performed to find the cause of the sciatica. These may include: °· Blood tests. °· X-rays. °· Imaging tests, such as an MRI or CT scan. °TREATMENT  °Treatment is directed at the cause of the sciatic pain. Sometimes, treatment is not necessary and the pain and discomfort goes away on its own. If treatment is needed, your   caregiver may suggest: °· Over-the-counter medicines to relieve pain. °· Prescription medicines, such as anti-inflammatory medicine, muscle relaxants, or narcotics. °· Applying heat or ice to the painful area. °· Steroid injections to lessen pain, irritation, and inflammation around the nerve. °· Reducing activity during periods of  pain. °· Exercising and stretching to strengthen your abdomen and improve flexibility of your spine. Your caregiver may suggest losing weight if the extra weight makes the back pain worse. °· Physical therapy. °· Surgery to eliminate what is pressing or pinching the nerve, such as a bone spur or part of a herniated disk. °HOME CARE INSTRUCTIONS  °· Only take over-the-counter or prescription medicines for pain or discomfort as directed by your caregiver. °· Apply ice to the affected area for 20 minutes, 3-4 times a day for the first 48-72 hours. Then try heat in the same way. °· Exercise, stretch, or perform your usual activities if these do not aggravate your pain. °· Attend physical therapy sessions as directed by your caregiver. °· Keep all follow-up appointments as directed by your caregiver. °· Do not wear high heels or shoes that do not provide proper support. °· Check your mattress to see if it is too soft. A firm mattress may lessen your pain and discomfort. °SEEK IMMEDIATE MEDICAL CARE IF:  °· You lose control of your bowel or bladder (incontinence). °· You have increasing weakness in the lower back, pelvis, buttocks, or legs. °· You have redness or swelling of your back. °· You have a burning sensation when you urinate. °· You have pain that gets worse when you lie down or awakens you at night. °· Your pain is worse than you have experienced in the past. °· Your pain is lasting longer than 4 weeks. °· You are suddenly losing weight without reason. °MAKE SURE YOU: °· Understand these instructions. °· Will watch your condition. °· Will get help right away if you are not doing well or get worse. °  °This information is not intended to replace advice given to you by your health care provider. Make sure you discuss any questions you have with your health care provider. °  °Document Released: 05/04/2001 Document Revised: 01/29/2015 Document Reviewed: 09/19/2011 °Elsevier Interactive Patient Education ©2016  Elsevier Inc. ° °

## 2015-05-08 NOTE — ED Notes (Signed)
Registration at bedside.

## 2015-05-08 NOTE — ED Provider Notes (Signed)
CSN: [id number]     Arrival date & time 05/08/15  1846 History  By signing my name below, I, Chris Randall, attest that this documentation has been prepared under the direction and in the presence of  [name], PA-C. Electronically Signed: Lyndel Randall, ED Scribe. 05/08/2015. 7:34 PM.   Chief Complaint  Patient presents with  . Back Pain   The history is provided by the patient. No language interpreter was used.   HPI Comments: Chris Randall is a 39 y.o. male, with a h/o chronic back pain, who presents to the Emergency Department complaining of worsening, constant, 8/10, throbbing bilateral lower back pain and bilateral gluteal pain that radiates down right lower extremity to anterior thigh and to right foot onset last night after a heavy lifting injury. He describes his pain to be a spasm in his back and right thigh.  Pain that radiates down his foot is "electrical, seering, and feels like someone is stabbing him in the bottom of his foot." His pain is worse with ambulation or long periods of standing. Pt states he took 1 dose of ibuprofen this morning without significant relief. He notes a history of intermittent chronic back pain that is similar to his current complaint. Denies numbness, tingling or weakness, fevers, chills, diaphoresis, bowel or bladder incontinence, saddle anesthesia or difficulty ambulating. No overlying skin changes. NKDA.  Past Medical History  Diagnosis Date  . Asthma   . Bronchitis   . Esophageal varices (HCC)    Past Surgical History  Procedure Laterality Date  . Shoulder surgery    . Rotator cuff repair  1995   Family History  Problem Relation Age of Onset  . Hypertension Other   . Cancer Other    Social History  Substance Use Topics  . Smoking status: Current Every Day Smoker -- 0.50 packs/day    Types: Cigarettes  . Smokeless tobacco: Current User     Comment: 3 cigarettes a day  . Alcohol Use: Yes     Comment: occasional    Review of  Systems  Constitutional: Negative for fever, chills and diaphoresis.  Musculoskeletal: Positive for back pain. Negative for gait problem.  Skin: Negative for color change and wound.  Neurological: Negative for weakness and numbness.  All other systems reviewed and are negative.  Allergies  Morphine and related and Vicodin  Home Medications   Prior to Admission medications   Medication Sig Start Date End Date Taking? Authorizing Provider  albuterol (PROVENTIL HFA;VENTOLIN HFA) 108 (90 BASE) MCG/ACT inhaler Inhale 1-2 puffs into the lungs every 6 (six) hours as needed for wheezing or shortness of breath.    Historical Provider, MD  dicyclomine (BENTYL) 20 MG tablet Take 1 tablet (20 mg total) by mouth 2 (two) times daily. Patient not taking: Reported on 04/24/2015 11/28/14   Loren Racer, MD  methocarbamol (ROBAXIN) 500 MG tablet Take 2 tablets (1,000 mg total) by mouth every 8 (eight) hours as needed for muscle spasms. Patient not taking: Reported on 04/24/2015 11/28/14   Loren Racer, MD  metoCLOPramide (REGLAN) 10 MG tablet Take 1 tablet (10 mg total) by mouth every 6 (six) hours. 04/24/15   Arby Barrette, MD  ondansetron (ZOFRAN ODT) 4 MG disintegrating tablet  ODT q4 hours prn nausea/vomit Patient not taking: Reported on 04/24/2015 11/28/14   Loren Racer, MD  oxyCODONE-acetaminophen (PERCOCET/ROXICET) 5-325 MG tablet Take 1 tablet by mouth every 6 (six) hours as needed for severe pain. 04/16/15   Earley Favor, NP  pantoprazole (PROTONIX) 20 MG tablet Take 1 tablet (20 mg total) by mouth daily. 04/24/15   Arby BarretteMarcy Pfeiffer, MD  polyethylene glycol (MIRALAX / GLYCOLAX) packet Take 17 g by mouth daily. Patient not taking: Reported on 04/24/2015 11/28/14   Loren Raceravid Yelverton, MD  promethazine (PHENERGAN) 25 MG tablet Take 1 tablet (25 mg total) by mouth every 6 (six) hours as needed for nausea or vomiting. Patient not taking: Reported on 04/24/2015 11/21/14   Dione Boozeavid Glick, MD   BP 143/82 mmHg   Pulse 98  Temp(Src) 98.3 F (36.8 C) (Oral)  Resp 20  Ht 5\' 3"  (1.6 m)  Wt 170 lb (77.111 kg)  BMI 30.12 kg/m2  SpO2 100% Physical Exam  Constitutional: He is oriented to person, place, and time. He appears well-developed and well-nourished. No distress.  HENT:  Head: Normocephalic and atraumatic.  Right Ear: External ear normal.  Left Ear: External ear normal.  Nose: Nose normal.  Mouth/Throat: Oropharynx is clear and moist. No oropharyngeal exudate.  Eyes: Conjunctivae and EOM are normal. Pupils are equal, round, and reactive to light. Right eye exhibits no discharge. Left eye exhibits no discharge. No scleral icterus.  Neck: Normal range of motion. Neck supple. No JVD present. No tracheal deviation present.  Cardiovascular: Normal rate and regular rhythm.   Pulmonary/Chest: Effort normal and breath sounds normal. No stridor. No respiratory distress.  Abdominal: Soft. Normal appearance and bowel sounds are normal. There is no tenderness. There is no CVA tenderness.  No CVA tenderness  Musculoskeletal: Normal range of motion. He exhibits tenderness. He exhibits no edema.       Lumbar back: He exhibits tenderness. He exhibits normal range of motion, no bony tenderness, no swelling, no edema, no deformity and no spasm.       Back:  DP and PT pulses 2 + bilaterally. Sensation to light touch intact. Strength is 5/5 in LLE and 4/5 in RLE with dorsiflexion, plantar flexion, and extension and flexion of the knee, and flexion of hip. TTP bilaterally to lumbar paraspinal muscles and bilateral SI joint. Normal gait and coordination  Lymphadenopathy:    He has no cervical adenopathy.  Neurological: He is alert and oriented to person, place, and time. He displays no tremor. No sensory deficit. He exhibits normal muscle tone. He displays no seizure activity. Coordination and gait normal. GCS eye subscore is 4. GCS verbal subscore is 5. GCS motor subscore is 6.  Skin: Skin is warm and dry. No rash  noted. He is not diaphoretic. No erythema. No pallor.  Psychiatric: He has a normal mood and affect. His behavior is normal. Judgment and thought content normal.  Nursing note and vitals reviewed.   ED Course  Procedures  DIAGNOSTIC STUDIES: Oxygen Saturation is 100% on RA, normal by my interpretation.    COORDINATION OF CARE: 7:08 PM Discussed treatment plan which includes to order IM Valium, Toradol, and Prednisone with pt. Pt acknowledges and agrees to plan.   MDM   Final diagnoses:  None    Patient with acute on chronic back pain.  Pt states that he has such severe chronic back pain, and needs surgery, however he lost his insurance.  He states that his pain is debilitating and he is unable to work.  He also reports "working in the yard" this week, and helping a friend to Museum/gallery exhibitions officerlift furniture, which is when he pulled his back.   The pt was observed walking into the exam room with a steady gait/normal ambulation.  At  the time of the exam he will barely move his right side and complains of muscle spasms, however palpation of all the areas with reported spasm are soft without fasciculations or spasm.  No neurological deficits and normal neuro exam, slightly decreased strength on right side, may be from sciatica or from poor effort.  He is also requesting only percocet and valium, stating that tramadol "does nothing for me," he also reports "esophageal ulcer irritation" with norco.   The pt reports that he was previously enrolled in pain management in the past, however was released from the program after having a friend falsify drug urinalysis for him.  He also has become tearful several times throughout his visit stating that his mom is admitted upstairs and has almost died, and since he was already here, that was the only reason he presents for his back pain today.  The pt otherwise denies loss of bowel or bladder control fever, night sweats, weight loss, h/o cancer, IVDA, no recent procedure to  back. No urinary symptoms suggestive of UTI. Pt has two recent MRIs of lumbar spine.  No concern for cauda equina.  Supportive care and return precaution discussed. Appears Randall for discharge at this time. Case management has seen the pt, as he expressed he is interested in the [facility] card program.   I personally performed the services described in this documentation, which was scribed in my presence. The recorded information has been reviewed and is accurate.      [name], PA-C 05/08/15 2029  Jerelyn Scott, MD 05/08/15 [phone]

## 2016-04-16 ENCOUNTER — Encounter (HOSPITAL_COMMUNITY): Payer: Self-pay | Admitting: Emergency Medicine

## 2016-04-16 ENCOUNTER — Emergency Department (HOSPITAL_COMMUNITY)
Admission: EM | Admit: 2016-04-16 | Discharge: 2016-04-16 | Disposition: A | Discharge status: Home / Self Care | Payer: Medicaid Other | Attending: Emergency Medicine | Admitting: Emergency Medicine

## 2016-04-16 ENCOUNTER — Emergency Department (HOSPITAL_COMMUNITY): Payer: Medicaid Other

## 2016-04-16 DIAGNOSIS — X501XXA Overexertion from prolonged static or awkward postures, initial encounter: Secondary | ICD-10-CM | POA: Insufficient documentation

## 2016-04-16 DIAGNOSIS — J45909 Unspecified asthma, uncomplicated: Secondary | ICD-10-CM | POA: Insufficient documentation

## 2016-04-16 DIAGNOSIS — F17210 Nicotine dependence, cigarettes, uncomplicated: Secondary | ICD-10-CM | POA: Insufficient documentation

## 2016-04-16 DIAGNOSIS — Y999 Unspecified external cause status: Secondary | ICD-10-CM | POA: Insufficient documentation

## 2016-04-16 DIAGNOSIS — Y9361 Activity, american tackle football: Secondary | ICD-10-CM | POA: Insufficient documentation

## 2016-04-16 DIAGNOSIS — M25512 Pain in left shoulder: Secondary | ICD-10-CM

## 2016-04-16 DIAGNOSIS — Y929 Unspecified place or not applicable: Secondary | ICD-10-CM | POA: Insufficient documentation

## 2016-04-16 MED ORDER — PANTOPRAZOLE SODIUM 20 MG PO TBEC
20.0000 mg | DELAYED_RELEASE_TABLET | Freq: Once | ORAL | Status: AC
  Administered 2016-04-16: 20 mg via ORAL
  Filled 2016-04-16: qty 1

## 2016-04-16 MED ORDER — OXYCODONE-ACETAMINOPHEN 5-325 MG PO TABS
1.0000 | ORAL_TABLET | Freq: Once | ORAL | Status: AC
  Administered 2016-04-16: 1 via ORAL
  Filled 2016-04-16: qty 1

## 2016-04-16 MED ORDER — OXYCODONE-ACETAMINOPHEN 5-325 MG PO TABS: 2.0000 | ORAL_TABLET | ORAL | 0 refills | Status: DC | PRN

## 2016-04-16 MED ORDER — OXYCODONE-ACETAMINOPHEN 5-325 MG PO TABS
1.0000 | ORAL_TABLET | ORAL | Status: DC | PRN
  Administered 2016-04-16: 1 via ORAL
  Filled 2016-04-16: qty 1

## 2016-04-16 NOTE — ED Notes (Signed)
Pt requesting more pain medication. Percocet given at 20:37. PA made aware of pts pain.

## 2016-04-16 NOTE — Discharge Instructions (Signed)
Your x-ray showed no fracture today. Take pain medicine as needed. You also need to take ibuprofen as directed. Please ice, elevate, and rest your shoulder. Wear the sling as needed for comfort.move your shoulder as tolerated.

## 2016-04-16 NOTE — ED Triage Notes (Signed)
Pt c/o left shoulder pain, states was playing football yesterday about 1100 and felt a pop in his shoulder. Pain has increased since. Describes pain as a "rip."

## 2016-04-19 NOTE — ED Provider Notes (Signed)
[facility]-EMERGENCY DEPT Provider Note   CSN: [id number] Arrival date & time: 04/16/16  1959     History   Chief Complaint Chief Complaint  Patient presents with  . Shoulder Injury    HPI Chris Randall is a 40 y.o. male.  The history is provided by the patient.  Shoulder Injury  This is a new (left shoulder injury while playing football) problem. The current episode started yesterday. The problem occurs constantly. The problem has been gradually worsening. Exacerbated by: moving. Nothing relieves the symptoms. He has tried a warm compress and acetaminophen for the symptoms. The treatment provided no relief.    Past Medical History:  Diagnosis Date  . Asthma   . Bronchitis   . Esophageal varices (HCC)     There are no active problems to display for this patient.   Past Surgical History:  Procedure Laterality Date  . ROTATOR CUFF REPAIR  1995  . SHOULDER SURGERY         Home Medications    Prior to Admission medications   Medication Sig Start Date End Date Taking? Authorizing Provider  albuterol (PROVENTIL HFA;VENTOLIN HFA) 108 (90 BASE) MCG/ACT inhaler Inhale 1-2 puffs into the lungs every 6 (six) hours as needed for wheezing or shortness of breath.    Historical Provider, MD  dicyclomine (BENTYL) 20 MG tablet Take 1 tablet (20 mg total) by mouth 2 (two) times daily. Patient not taking: Reported on 04/24/2015 11/28/14   Loren Raceravid Yelverton, MD  methocarbamol (ROBAXIN) 500 MG tablet Take 1 tablet (500 mg total) by mouth 2 (two) times daily as needed for muscle spasms. 05/08/15   Danelle BerryLeisa Tapia, PA-C  metoCLOPramide (REGLAN) 10 MG tablet Take 1 tablet (10 mg total) by mouth every 6 (six) hours. 04/24/15   Arby BarretteMarcy Pfeiffer, MD  ondansetron (ZOFRAN ODT) 4 MG disintegrating tablet 4mg  ODT q4 hours prn nausea/vomit Patient not taking: Reported on 04/24/2015 11/28/14   Loren Raceravid Yelverton, MD  oxyCODONE-acetaminophen (PERCOCET/ROXICET) 5-325 MG tablet Take 2 tablets by mouth every 4 (four)  hours as needed for severe pain. 04/16/16   Rise MuKenneth T Nakeya Adinolfi, PA-C  pantoprazole (PROTONIX) 20 MG tablet Take 1 tablet (20 mg total) by mouth daily. 05/08/15   Danelle BerryLeisa Tapia, PA-C  polyethylene glycol (MIRALAX / GLYCOLAX) packet Take 17 g by mouth daily. Patient not taking: Reported on 04/24/2015 11/28/14   Loren Raceravid Yelverton, MD  predniSONE (DELTASONE) 20 MG tablet Take 2 tablets (40 mg total) by mouth daily. 05/08/15   Danelle BerryLeisa Tapia, PA-C  promethazine (PHENERGAN) 25 MG tablet Take 1 tablet (25 mg total) by mouth every 6 (six) hours as needed for nausea or vomiting. Patient not taking: Reported on 04/24/2015 11/21/14   Dione Boozeavid Glick, MD    Family History Family History  Problem Relation Age of Onset  . Hypertension Other   . Cancer Other     Social History Social History  Substance Use Topics  . Smoking status: Current Every Day Smoker    Packs/day: 0.50    Types: Cigarettes  . Smokeless tobacco: Current User     Comment: 3 cigarettes a day  . Alcohol use Yes     Comment: occasional     Allergies   Morphine and related and Vicodin [hydrocodone-acetaminophen]   Review of Systems Review of Systems  Constitutional: Negative for chills and fever.  Gastrointestinal: Negative for nausea and vomiting.  Musculoskeletal: Positive for arthralgias, joint swelling and myalgias.  Skin: Negative.   All other systems reviewed and are negative.  Physical Exam Updated Vital Signs BP (!) 135/102 (BP Location: Right Arm)   Pulse 100   Temp 98.1 F (36.7 C) (Oral)   Resp 18   Ht $RemoveBeforeDEI _QnFLebMDmAlsRqwJYfefLgPVTUsccyNn$5\' 4"m   Physical Exam  Constitutional: He appears well-developed and well-nourished. No distress.  Eyes: Right eye exhibits no discharge. Left eye exhibits no discharge. No scleral icterus.  Pulmonary/Chest: No respiratory distress.  Musculoskeletal:       Left shoulder: He exhibits decreased range of motion (Due to pain), tenderness, bony tenderness, pain  and decreased strength (due to pain). He exhibits no swelling, no effusion, no crepitus, no deformity and normal pulse.  No joint laxity noted. Positive neers and hawkins test. Radial pulses are 2+ bilaterally. Sensation intact. Cap refill normal. Full ROM of the left elbow.   Neurological: He is alert.  Skin: No pallor.  Nursing note and vitals reviewed.    ED Treatments / Results  Labs (all labs ordered are listed, but only abnormal results are displayed) Labs Reviewed - No data to display  EKG  EKG Interpretation None       Radiology No results found.  Procedures Procedures (including critical care time)  Medications Ordered in ED Medications  pantoprazole (PROTONIX) EC tablet 20 mg (20 mg Oral Given 04/16/16 2107)  oxyCODONE-acetaminophen (PERCOCET/ROXICET) 5-325 MG per tablet 1 tablet (1 tablet Oral Given 04/16/16 2243)     Initial Impression / Assessment and Plan / ED Course  I have reviewed the triage vital signs and the nursing notes.  Pertinent labs & imaging results that were available during my care of the patient were reviewed by me and considered in my medical decision making (see chart for details).  Clinical Course   Patient X-Ray negative for obvious fracture or dislocation. Pain managed in ED. Pt advised to follow up with orthopedics if symptoms persist for possibility of missed fracture diagnosis. Patient given sling while in ED, conservative therapy recommended and discussed. Pt is hemodynamically stable, in NAD, & able to ambulate in the ED. Pain has been managed & has no complaints prior to dc. Pt is comfortable with above plan and is stable for discharge at this time. All questions were answered prior to disposition. Strict return precautions for f/u to the ED were discussed.    Final Clinical Impressions(s) / ED Diagnoses   Final diagnoses:  Acute pain of left shoulder    New Prescriptions Discharge Medication List as of 04/16/2016 10:55 PM         Rise MuKenneth T Kynan Peasley, PA-C 04/19/16 2137    Nira ConnPedro Eduardo Cardama, MD 04/22/16 1454

## 2016-05-04 ENCOUNTER — Emergency Department (HOSPITAL_COMMUNITY): Payer: Self-pay

## 2016-05-04 ENCOUNTER — Encounter (HOSPITAL_COMMUNITY): Payer: Self-pay

## 2016-05-04 ENCOUNTER — Emergency Department (HOSPITAL_COMMUNITY)
Admission: EM | Admit: 2016-05-04 | Discharge: 2016-05-04 | Disposition: A | Discharge status: Home / Self Care | Payer: Self-pay | Attending: Emergency Medicine | Admitting: Emergency Medicine

## 2016-05-04 DIAGNOSIS — R930 Abnormal findings on diagnostic imaging of skull and head, not elsewhere classified: Secondary | ICD-10-CM | POA: Insufficient documentation

## 2016-05-04 DIAGNOSIS — Y929 Unspecified place or not applicable: Secondary | ICD-10-CM | POA: Insufficient documentation

## 2016-05-04 DIAGNOSIS — Y999 Unspecified external cause status: Secondary | ICD-10-CM | POA: Insufficient documentation

## 2016-05-04 DIAGNOSIS — Z79899 Other long term (current) drug therapy: Secondary | ICD-10-CM | POA: Insufficient documentation

## 2016-05-04 DIAGNOSIS — J45909 Unspecified asthma, uncomplicated: Secondary | ICD-10-CM | POA: Insufficient documentation

## 2016-05-04 DIAGNOSIS — F17210 Nicotine dependence, cigarettes, uncomplicated: Secondary | ICD-10-CM | POA: Insufficient documentation

## 2016-05-04 DIAGNOSIS — S1093XA Contusion of unspecified part of neck, initial encounter: Secondary | ICD-10-CM | POA: Insufficient documentation

## 2016-05-04 DIAGNOSIS — S20212A Contusion of left front wall of thorax, initial encounter: Secondary | ICD-10-CM | POA: Insufficient documentation

## 2016-05-04 DIAGNOSIS — Y939 Activity, unspecified: Secondary | ICD-10-CM | POA: Insufficient documentation

## 2016-05-04 DIAGNOSIS — S40012A Contusion of left shoulder, initial encounter: Secondary | ICD-10-CM | POA: Insufficient documentation

## 2016-05-04 MED ORDER — OXYCODONE-ACETAMINOPHEN 5-325 MG PO TABS
1.0000 | ORAL_TABLET | Freq: Once | ORAL | Status: AC
  Administered 2016-05-04: 1 via ORAL
  Filled 2016-05-04: qty 1

## 2016-05-04 MED ORDER — ALBUTEROL SULFATE HFA 108 (90 BASE) MCG/ACT IN AERS: 1.0000 | INHALATION_SPRAY | Freq: Four times a day (QID) | RESPIRATORY_TRACT | 0 refills | Status: DC | PRN

## 2016-05-04 MED ORDER — ONDANSETRON 8 MG PO TBDP
8.0000 mg | ORAL_TABLET | Freq: Once | ORAL | Status: AC
  Administered 2016-05-04: 8 mg via ORAL
  Filled 2016-05-04: qty 1

## 2016-05-04 MED ORDER — PANTOPRAZOLE SODIUM 20 MG PO TBEC: 20.0000 mg | DELAYED_RELEASE_TABLET | Freq: Two times a day (BID) | ORAL | 0 refills | Status: DC

## 2016-05-04 MED ORDER — OXYCODONE-ACETAMINOPHEN 7.5-325 MG PO TABS: 1.0000 | ORAL_TABLET | ORAL | 0 refills | Status: DC | PRN

## 2016-05-04 NOTE — ED Notes (Signed)
Discharge instructions, follow up care, and prescriptions reviewed with patient. Patient verbalized understanding. 

## 2016-05-04 NOTE — ED Notes (Signed)
ED Provider at bedside. 
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
## 2016-05-04 NOTE — ED Triage Notes (Signed)
Pt states that he has vomited a few times because of the pain
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
## 2016-08-02 ENCOUNTER — Inpatient Hospital Stay (HOSPITAL_COMMUNITY)
Admission: EM | Admit: 2016-08-02 | Discharge: 2016-08-05 | DRG: 378 | Disposition: A | Discharge status: Home / Self Care | Payer: Self-pay | Attending: Internal Medicine | Admitting: Internal Medicine

## 2016-08-02 ENCOUNTER — Encounter (HOSPITAL_COMMUNITY): Payer: Self-pay | Admitting: Emergency Medicine

## 2016-08-02 ENCOUNTER — Emergency Department (HOSPITAL_COMMUNITY): Payer: Self-pay

## 2016-08-02 ENCOUNTER — Observation Stay (HOSPITAL_COMMUNITY): Payer: Self-pay | Admitting: Certified Registered Nurse Anesthetist

## 2016-08-02 ENCOUNTER — Encounter (HOSPITAL_COMMUNITY)
Admission: EM | Disposition: A | Discharge status: Home / Self Care | Payer: Self-pay | Source: Home / Self Care | Attending: Internal Medicine

## 2016-08-02 DIAGNOSIS — K2270 Barrett's esophagus without dysplasia: Secondary | ICD-10-CM | POA: Diagnosis present

## 2016-08-02 DIAGNOSIS — Z8249 Family history of ischemic heart disease and other diseases of the circulatory system: Secondary | ICD-10-CM

## 2016-08-02 DIAGNOSIS — K2961 Other gastritis with bleeding: Secondary | ICD-10-CM

## 2016-08-02 DIAGNOSIS — K2210 Ulcer of esophagus without bleeding: Secondary | ICD-10-CM | POA: Diagnosis present

## 2016-08-02 DIAGNOSIS — E871 Hypo-osmolality and hyponatremia: Secondary | ICD-10-CM | POA: Diagnosis present

## 2016-08-02 DIAGNOSIS — R1010 Upper abdominal pain, unspecified: Secondary | ICD-10-CM

## 2016-08-02 DIAGNOSIS — K2901 Acute gastritis with bleeding: Principal | ICD-10-CM | POA: Diagnosis present

## 2016-08-02 DIAGNOSIS — I8500 Esophageal varices without bleeding: Secondary | ICD-10-CM | POA: Diagnosis present

## 2016-08-02 DIAGNOSIS — R1030 Lower abdominal pain, unspecified: Secondary | ICD-10-CM

## 2016-08-02 DIAGNOSIS — R109 Unspecified abdominal pain: Secondary | ICD-10-CM | POA: Diagnosis present

## 2016-08-02 DIAGNOSIS — K449 Diaphragmatic hernia without obstruction or gangrene: Secondary | ICD-10-CM | POA: Diagnosis present

## 2016-08-02 DIAGNOSIS — Z23 Encounter for immunization: Secondary | ICD-10-CM

## 2016-08-02 DIAGNOSIS — R112 Nausea with vomiting, unspecified: Secondary | ICD-10-CM

## 2016-08-02 DIAGNOSIS — J45909 Unspecified asthma, uncomplicated: Secondary | ICD-10-CM | POA: Diagnosis present

## 2016-08-02 DIAGNOSIS — B1920 Unspecified viral hepatitis C without hepatic coma: Secondary | ICD-10-CM | POA: Diagnosis present

## 2016-08-02 DIAGNOSIS — R748 Abnormal levels of other serum enzymes: Secondary | ICD-10-CM

## 2016-08-02 DIAGNOSIS — K920 Hematemesis: Secondary | ICD-10-CM

## 2016-08-02 DIAGNOSIS — K210 Gastro-esophageal reflux disease with esophagitis: Secondary | ICD-10-CM | POA: Diagnosis present

## 2016-08-02 DIAGNOSIS — Z720 Tobacco use: Secondary | ICD-10-CM | POA: Diagnosis present

## 2016-08-02 DIAGNOSIS — F17210 Nicotine dependence, cigarettes, uncomplicated: Secondary | ICD-10-CM | POA: Diagnosis present

## 2016-08-02 HISTORY — PX: ESOPHAGOGASTRODUODENOSCOPY (EGD) WITH PROPOFOL: SHX5813

## 2016-08-02 HISTORY — DX: Hematemesis: K92.0

## 2016-08-02 HISTORY — DX: Scrotal varices: I86.1

## 2016-08-02 HISTORY — DX: Unspecified asthma, uncomplicated: J45.909

## 2016-08-02 LAB — PROTIME-INR
INR: 1.02
PROTHROMBIN TIME: 13.4 s (ref 11.4–15.2)

## 2016-08-02 LAB — URINALYSIS, ROUTINE W REFLEX MICROSCOPIC
Bilirubin Urine: NEGATIVE
Glucose, UA: NEGATIVE mg/dL
KETONES UR: NEGATIVE mg/dL
LEUKOCYTES UA: NEGATIVE
Nitrite: NEGATIVE
PH: 6 (ref 5.0–8.0)
Protein, ur: 30 mg/dL — AB
SQUAMOUS EPITHELIAL / LPF: NONE SEEN
Specific Gravity, Urine: 1.012 (ref 1.005–1.030)

## 2016-08-02 LAB — LIPASE, BLOOD: Lipase: 21 U/L (ref 11–51)

## 2016-08-02 LAB — CBC
HCT: 39 % (ref 39.0–52.0)
HEMATOCRIT: 45.6 % (ref 39.0–52.0)
HEMOGLOBIN: 13 g/dL (ref 13.0–17.0)
Hemoglobin: 14.8 g/dL (ref 13.0–17.0)
MCH: 27.3 pg (ref 26.0–34.0)
MCH: 27.9 pg (ref 26.0–34.0)
MCHC: 32.5 g/dL (ref 30.0–36.0)
MCHC: 33.3 g/dL (ref 30.0–36.0)
MCV: 84 fL (ref 78.0–100.0)
MCV: 83.7 fL (ref 78.0–100.0)
Platelets: 233 10*3/uL (ref 150–400)
Platelets: 176 10*3/uL (ref 150–400)
RBC: 5.43 MIL/uL (ref 4.22–5.81)
RBC: 4.66 MIL/uL (ref 4.22–5.81)
RDW: 13 % (ref 11.5–15.5)
RDW: 13 % (ref 11.5–15.5)
WBC: 6.9 10*3/uL (ref 4.0–10.5)
WBC: 6.9 10*3/uL (ref 4.0–10.5)

## 2016-08-02 LAB — COMPREHENSIVE METABOLIC PANEL
ALK PHOS: 92 U/L (ref 38–126)
ALT: 100 U/L — AB (ref 17–63)
AST: 56 U/L — ABNORMAL HIGH (ref 15–41)
Albumin: 4.1 g/dL (ref 3.5–5.0)
Anion gap: 8 (ref 5–15)
BILIRUBIN TOTAL: 0.7 mg/dL (ref 0.3–1.2)
BUN: 11 mg/dL (ref 6–20)
CHLORIDE: 104 mmol/L (ref 101–111)
CO2: 24 mmol/L (ref 22–32)
Calcium: 8.6 mg/dL — ABNORMAL LOW (ref 8.9–10.3)
Creatinine, Ser: 1.1 mg/dL (ref 0.61–1.24)
GFR calc Af Amer: >60 mL/min (ref >60)
GFR calc non Af Amer: >60 mL/min (ref >60)
GLUCOSE: 123 mg/dL — AB (ref 65–99)
POTASSIUM: 3.6 mmol/L (ref 3.5–5.1)
SODIUM: 136 mmol/L (ref 135–145)
Total Protein: 7.1 g/dL (ref 6.5–8.1)

## 2016-08-02 LAB — TYPE AND SCREEN
ABO/RH(D): O POS
ANTIBODY SCREEN: NEGATIVE

## 2016-08-02 LAB — ABO/RH: ABO/RH(D): O POS

## 2016-08-02 SURGERY — ESOPHAGOGASTRODUODENOSCOPY (EGD) WITH PROPOFOL
Anesthesia: Monitor Anesthesia Care

## 2016-08-02 MED ORDER — FENTANYL CITRATE (PF) 100 MCG/2ML IJ SOLN
50.0000 ug | INTRAMUSCULAR | Status: AC | PRN
  Administered 2016-08-02 (×2): 50 ug via INTRAVENOUS
  Filled 2016-08-02 (×2): qty 2

## 2016-08-02 MED ORDER — HYDROMORPHONE HCL 1 MG/ML IJ SOLN
0.5000 mg | Freq: Once | INTRAMUSCULAR | Status: AC
  Administered 2016-08-02: 0.5 mg via INTRAVENOUS
  Filled 2016-08-02: qty 0.5

## 2016-08-02 MED ORDER — PROPOFOL 10 MG/ML IV BOLUS
INTRAVENOUS | Status: AC
  Filled 2016-08-02: qty 40

## 2016-08-02 MED ORDER — IOPAMIDOL (ISOVUE-300) INJECTION 61%
100.0000 mL | Freq: Once | INTRAVENOUS | Status: AC | PRN
  Administered 2016-08-02: 100 mL via INTRAVENOUS

## 2016-08-02 MED ORDER — DICYCLOMINE HCL 10 MG/ML IM SOLN
20.0000 mg | Freq: Once | INTRAMUSCULAR | Status: DC
  Filled 2016-08-02: qty 2

## 2016-08-02 MED ORDER — PROPOFOL 500 MG/50ML IV EMUL
INTRAVENOUS | Status: DC | PRN
  Administered 2016-08-02: 150 ug/kg/min via INTRAVENOUS

## 2016-08-02 MED ORDER — PANTOPRAZOLE SODIUM 40 MG IV SOLR
80.0000 mg | Freq: Once | INTRAVENOUS | Status: AC
  Administered 2016-08-02: 80 mg via INTRAVENOUS
  Filled 2016-08-02: qty 80

## 2016-08-02 MED ORDER — PANTOPRAZOLE SODIUM 40 MG IV SOLR: 40.0000 mg | Freq: Two times a day (BID) | INTRAVENOUS | Status: DC

## 2016-08-02 MED ORDER — GI COCKTAIL ~~LOC~~
30.0000 mL | Freq: Once | ORAL | Status: AC
  Administered 2016-08-02: 30 mL via ORAL
  Filled 2016-08-02: qty 30

## 2016-08-02 MED ORDER — ONDANSETRON HCL 4 MG/2ML IJ SOLN
4.0000 mg | Freq: Once | INTRAMUSCULAR | Status: AC | PRN
  Administered 2016-08-02: 4 mg via INTRAVENOUS
  Filled 2016-08-02: qty 2

## 2016-08-02 MED ORDER — SODIUM CHLORIDE 0.9 % IV SOLN: INTRAVENOUS | Status: DC

## 2016-08-02 MED ORDER — PHENOL 1.4 % MT LIQD
1.0000 | OROMUCOSAL | Status: DC | PRN
  Administered 2016-08-02: 1 via OROMUCOSAL
  Filled 2016-08-02: qty 177

## 2016-08-02 MED ORDER — NICOTINE 14 MG/24HR TD PT24
14.0000 mg | MEDICATED_PATCH | Freq: Every day | TRANSDERMAL | Status: DC
  Administered 2016-08-02 – 2016-08-05 (×4): 14 mg via TRANSDERMAL
  Filled 2016-08-02 (×5): qty 1

## 2016-08-02 MED ORDER — HYDROMORPHONE HCL 1 MG/ML IJ SOLN
0.5000 mg | INTRAMUSCULAR | Status: DC | PRN
  Administered 2016-08-02 – 2016-08-05 (×22): 0.5 mg via INTRAVENOUS
  Filled 2016-08-02 (×22): qty 0.5

## 2016-08-02 MED ORDER — ALBUTEROL SULFATE HFA 108 (90 BASE) MCG/ACT IN AERS
INHALATION_SPRAY | RESPIRATORY_TRACT | Status: AC
  Filled 2016-08-02: qty 6.7

## 2016-08-02 MED ORDER — ONDANSETRON HCL 4 MG/2ML IJ SOLN
INTRAMUSCULAR | Status: DC | PRN
  Administered 2016-08-02: 4 mg via INTRAVENOUS

## 2016-08-02 MED ORDER — IOPAMIDOL (ISOVUE-300) INJECTION 61%
INTRAVENOUS | Status: AC
  Administered 2016-08-02: 100 mL via INTRAVENOUS
  Filled 2016-08-02: qty 100

## 2016-08-02 MED ORDER — ONDANSETRON HCL 4 MG/2ML IJ SOLN
INTRAMUSCULAR | Status: AC
  Filled 2016-08-02: qty 2

## 2016-08-02 MED ORDER — ONDANSETRON HCL 4 MG PO TABS: 4.0000 mg | ORAL_TABLET | Freq: Four times a day (QID) | ORAL | Status: DC | PRN

## 2016-08-02 MED ORDER — ALBUTEROL SULFATE (2.5 MG/3ML) 0.083% IN NEBU: 2.5000 mg | INHALATION_SOLUTION | RESPIRATORY_TRACT | Status: DC | PRN

## 2016-08-02 MED ORDER — ALBUTEROL SULFATE HFA 108 (90 BASE) MCG/ACT IN AERS
INHALATION_SPRAY | RESPIRATORY_TRACT | Status: DC | PRN
  Administered 2016-08-02: 2 via RESPIRATORY_TRACT

## 2016-08-02 MED ORDER — LIP MEDEX EX OINT
TOPICAL_OINTMENT | CUTANEOUS | Status: DC | PRN
  Administered 2016-08-02: 16:00:00 via TOPICAL
  Filled 2016-08-02: qty 7

## 2016-08-02 MED ORDER — SODIUM CHLORIDE 0.9 % IV SOLN
8.0000 mg/h | INTRAVENOUS | Status: AC
  Administered 2016-08-02 – 2016-08-04 (×5): 8 mg/h via INTRAVENOUS
  Filled 2016-08-02 (×13): qty 80

## 2016-08-02 MED ORDER — SODIUM CHLORIDE 0.9 % IV SOLN
INTRAVENOUS | Status: DC
  Administered 2016-08-02 (×2): via INTRAVENOUS

## 2016-08-02 MED ORDER — ONDANSETRON HCL 4 MG/2ML IJ SOLN
4.0000 mg | Freq: Once | INTRAMUSCULAR | Status: AC
  Administered 2016-08-02: 4 mg via INTRAVENOUS
  Filled 2016-08-02: qty 2

## 2016-08-02 MED ORDER — INFLUENZA VAC SPLIT QUAD 0.5 ML IM SUSY
0.5000 mL | PREFILLED_SYRINGE | INTRAMUSCULAR | Status: AC
  Administered 2016-08-03: 0.5 mL via INTRAMUSCULAR
  Filled 2016-08-02: qty 0.5

## 2016-08-02 MED ORDER — LIDOCAINE 2% (20 MG/ML) 5 ML SYRINGE
INTRAMUSCULAR | Status: AC
  Filled 2016-08-02: qty 5

## 2016-08-02 MED ORDER — ONDANSETRON HCL 4 MG/2ML IJ SOLN
4.0000 mg | Freq: Four times a day (QID) | INTRAMUSCULAR | Status: DC | PRN
  Administered 2016-08-04: 4 mg via INTRAVENOUS
  Filled 2016-08-02: qty 2

## 2016-08-02 SURGICAL SUPPLY — 15 items

## 2016-08-02 NOTE — ED Notes (Signed)
Pt is aware of the urine specimen and will provide one when he is able

## 2016-08-02 NOTE — H&P (Signed)
History and Physical    TAJAI IHDE ZOX:[id number] DOB: [birth year] DOA: 08/02/2016  PCP: No PCP Per Patient   I have briefly reviewed patients previous medical reports in [facility].  Patient coming from: Home  Chief Complaint: Abdominal pain, vomiting since last night including an episode of vomiting blood in the ED.  HPI: Chris Randall is a 41 year old male, single, lives with family, independent of activities of daily living, works in the tree cutting business, PMH of controlled asthma, reported esophageal varices, GERD and GI bleed, first diagnosed at age 15 by EGD and claims to have quit alcohol abuse since, tobacco abuse, presented to the [facility] ED on 08/02/16 with complaints of abdominal pain and vomiting. Patient follows with [facility] in [place]. He gives history of chronic significant heartburn for which he is supposed to be on regular Protonix but ran out of it several weeks ago due to lack of insurance and inability to afford. He then started using some of his mother's Protonix but those ran out as well. Since last 1 week, he gives history of worsening heartburn but no nausea or vomiting. On the evening prior to admission, he ate a beef sandwich without cheese at approximately 6 PM and drank some caffeinated drinks. 2-3 hours later, he started noticing subacute onset of upper abdominal pain (clearly shows above umbilicus), worsening heartburn and 5 episodes of nonbloody emesis at home. Upper abdominal pain has progressively worsened, rated at 9/10 in severity, sharp, continuous, nonradiating, minimal relief to PPI or GI cocktail provided in the ED. Since being in the ED, vomited 3 more times and one of those episodes included approximately half a cup of blood. Normal colored stools on day prior to admission. No fever or chills. Denies use of NSAIDs and specifically indicates that he quit drinking alcohol at age 3 years. Ongoing tobacco abuse. No  sick contacts with similar complaints. He reports that he had EGD done 2 years ago at [facility] which showed varices,? Ulcers and "black spots" and advised him that he should have surveillance every year for "cancer". He cannot recollect being told to have cirrhosis.   ED Course: Vital signs stable. CBC and CMP only significant for AST 56, ALT 100. Chest x-ray suggests infiltration at the right lung base although he does not have any respiratory symptoms and this was not confirmed on CT chest which captured the base. CT abdomen and pelvis with contrast shows distal hiatal hernia with suspected distal esophageal varices. Minimal ventral hernia containing only fat. Multiple distal colonic diverticula without diverticulitis and no acute findings.  Review of Systems:  All other systems reviewed and apart from HPI, are negative.  Past Medical History:  Diagnosis Date  . Asthma   . Bronchitis   . Esophageal varices (HCC)     Past Surgical History:  Procedure Laterality Date  . ROTATOR CUFF REPAIR  1995  . SHOULDER SURGERY      Social History  reports that he has been smoking Cigarettes.  He has been smoking about 0.50 packs per day. He uses smokeless tobacco. He reports that he drinks alcohol. He reports that he does not use drugs.  Allergies  Allergen Reactions  . Ibuprofen Other (See Comments)    Esophageal ulcers aggravates them  . Vicodin [Hydrocodone-Acetaminophen] Other (See Comments)    Esophageal ulcers aggravates them.     Family History  Problem Relation Age of Onset  . Hypertension Other   . Cancer Other  Prior to Admission medications   Medication Sig Start Date End Date Taking? Authorizing Provider  albuterol (PROVENTIL HFA;VENTOLIN HFA) 108 (90 Base) MCG/ACT inhaler Inhale 1-2 puffs into the lungs every 6 (six) hours as needed for wheezing or shortness of breath. 05/04/16  Yes Dione Boozeavid Glick, MD    Physical Exam: Vitals:   08/02/16 [id number] 08/02/16 0620  08/02/16 0840 08/02/16 0939  BP: 93/66 100/64 106/62 (!) 127/56  Pulse: 88 88 81 74  Resp: 20 20 18 20   Temp:   98.1 F (36.7 C) 97.8 F (36.6 C)  TempSrc:   Oral Oral  SpO2: 96% 95% 99% 98%  Weight:      Height:          Constitutional: Pleasant young male, well built and nourished, ambulating comfortably in the ED. Eyes: PERTLA, lids and conjunctivae normal ENMT: Mucous membranes are moist. Posterior pharynx clear of any exudate or lesions. Normal dentition. No blood or black colored material noted. Neck: supple, no masses, no thyromegaly Respiratory: clear to auscultation bilaterally, no wheezing, no crackles. Normal respiratory effort. No accessory muscle use.  Cardiovascular: S1 & S2 heard, regular rate and rhythm, no murmurs / rubs / gallops. No extremity edema. 2+ pedal pulses. No carotid bruits.  Abdomen: Nondistended and soft. Mild upper quadrant abdominal tenderness but without rigidity, guarding or rebound. No hepatosplenomegaly. Bowel sounds normal.  Musculoskeletal: no clubbing / cyanosis. No joint deformity upper and lower extremities. Good ROM, no contractures. Normal muscle tone.  Skin: no rashes, lesions, ulcers. No induration Neurologic: CN 2-12 grossly intact. Sensation intact, DTR normal. Strength 5/5 in all 4 limbs.  Psychiatric: Normal judgment and insight. Alert and oriented x 3. Appears slightly anxious.     Labs on Admission: I have personally reviewed following labs and imaging studies  CBC:  Recent Labs Lab 08/02/16 0315  WBC 6.9  HGB 14.8  HCT 45.6  MCV 84.0  PLT 233   Basic Metabolic Panel:  Recent Labs Lab 08/02/16 0315  NA 136  K 3.6  CL 104  CO2 24  GLUCOSE 123*  BUN 11  CREATININE 1.10  CALCIUM 8.6*   Liver Function Tests:  Recent Labs Lab 08/02/16 0315  AST 56*  ALT 100*  ALKPHOS 92  BILITOT 0.7  PROT 7.1  ALBUMIN 4.1   Coagulation Profile:  Recent Labs Lab 08/02/16 0315  INR 1.02   Urine analysis:      Component Value Date/Time   COLORURINE RED (A) 08/02/2016 0750   APPEARANCEUR HAZY (A) 08/02/2016 0750   LABSPEC 1.012 08/02/2016 0750   PHURINE 6.0 08/02/2016 0750   GLUCOSEU NEGATIVE 08/02/2016 0750   HGBUR LARGE (A) 08/02/2016 0750   BILIRUBINUR NEGATIVE 08/02/2016 0750   KETONESUR NEGATIVE 08/02/2016 0750   PROTEINUR 30 (A) 08/02/2016 0750   UROBILINOGEN 0.2 11/28/2014 0009   NITRITE NEGATIVE 08/02/2016 0750   LEUKOCYTESUR NEGATIVE 08/02/2016 0750     Radiological Exams on Admission: Ct Abdomen Pelvis W Contrast  Result Date: 08/02/2016 CLINICAL DATA:  Abdominal pain with nausea and vomiting EXAM: CT ABDOMEN AND PELVIS WITH CONTRAST TECHNIQUE: Multidetector CT imaging of the abdomen and pelvis was performed using the standard protocol following bolus administration of intravenous contrast. CONTRAST:  100 mL Isovue 370 nonionic COMPARISON:  April 16, 2015 FINDINGS: Lower chest: Lung bases are clear. There is a distal hiatal hernia. A somewhat serpiginous appearance in this area suggests a degree of underlying distal esophageal varices. Hepatobiliary: There is fatty infiltration near the fissure for the  ligamentum teres. There is an 8 mm cyst in the posterior segment of the right lobe of the liver. No other focal liver lesions are evident. Gallbladder wall is not appreciably thickened. There is no biliary duct dilatation. Pancreas: There is no pancreatic mass or inflammatory focus. Spleen: No splenic lesions are evident. There is a small accessory spleen between the spleen and lateral left kidney peer Adrenals/Urinary Tract: Adrenals appear normal bilaterally. Kidneys bilaterally show no demonstrable mass or hydronephrosis on either side. There is no renal or ureteral calculus on either side. Urinary bladder is midline with wall thickness within normal limits. Stomach/Bowel: There are multiple sigmoid diverticula and descending colonic diverticula without diverticulitis. There is no bowel  wall or mesenteric thickening. No bowel obstruction or free air. Vascular/Lymphatic: There is no abdominal aortic aneurysm. There are foci of atherosclerotic calcification at the aortic bifurcation and proximal iliac arteries. Major mesenteric vessels appear patent. There is no appreciable adenopathy in the abdomen or pelvis. Reproductive: There are a few small prostatic calculi. Prostate and seminal vesicles are normal in size and contour. Other: Appendix appears normal. No ascites or abscess evident in the abdomen or pelvis. There is a small ventral hernia containing only fat. Musculoskeletal: There are no blastic or lytic bone lesions. No intramuscular or abdominal wall lesion. IMPRESSION: Distal hiatal hernia with suspected distal esophageal varices. Minimal ventral hernia containing only fat. Multiple distal colonic diverticula without diverticulitis. No bowel obstruction. No abscess. Appendix appears normal. No renal or ureteral calculus. No hydronephrosis. There are occasional prostatic calculi. Focal fatty infiltration in the region of the fissure for the ligamentum teres. Electronically Signed   By: Bretta Bang III M.D.   On: 08/02/2016 07:15   Dg Abdomen Acute W/chest  Result Date: 08/02/2016 CLINICAL DATA:  Severe abdominal pain, nausea and vomiting with acute onset 2 hours ago. History of ulcers. EXAM: DG ABDOMEN ACUTE W/ 1V CHEST COMPARISON:  Chest and pelvis 05/04/2016. CT abdomen and pelvis 04/16/2015 FINDINGS: Shallow inspiration. Normal heart size and pulmonary vascularity. Suggestion of infiltration in the right lung base, possibly pneumonia. No blunting of costophrenic angles. No pneumothorax. Mediastinal contours appear intact. Scattered gas and stool throughout the colon. No small or large bowel distention. No free intra-abdominal air. No abnormal air-fluid levels. No radiopaque stones. Visualized bones appear intact. IMPRESSION: Suggestion of infiltration in the right lung base,  possibly pneumonia. Normal nonobstructive bowel gas pattern. Electronically Signed   By: Burman Nieves M.D.   On: 08/02/2016 04:27      Assessment/Plan Principal Problem:   Hematemesis/vomiting blood Active Problems:   Abdominal pain   Tobacco abuse   Asthma   Esophageal varices determined by endoscopy (HCC)     1. Acute abdominal pain with vomiting and an episode of hematemesis: DD: Acute GE complicated by Mallory-Weiss tear, esophagitis/gastritis, ulcer disease, variceal bleed versus other etiologies. NPO, IV fluids, continue IV PPI infusion. [facility] consulted who plan to perform an EGD later today. Requested medical records from [facility]/patient's outpatient GI. Follow CBCs closely. 2. GERD: Mx as above. 3. Tobacco abuse: Cessation counseled. Nicotine patch as per patient's request. 4. Asthma: Stable without clinical bronchospasm. When necessary albuterol nebulizations. 5. Esophageal varices: No comment about liver texture/cirrhosis on CT abdomen. Unclear etiology. Mildly abnormal transaminases. On repeated questioning, patient denies history of alcohol use. 6. Hemoglobin urea in the absence of hematuria: Unclear etiology. Outpatient follow-up. No urinary symptoms reported.   DVT prophylaxis: SCDs  Code Status: Full  Family Communication: None at  bedside.  Disposition Plan: DC home when medically improved and stable.  Consults called: [facility]  Admission status: Medical bed, Observation.    [facility] MD [facility] Pager [phone]  If 7PM-7AM, please contact night-coverage [email or web address] Password [facility]  08/02/2016, 10:12 AM

## 2016-08-02 NOTE — ED Notes (Signed)
Pt in too much pain to give urine

## 2016-08-02 NOTE — Anesthesia Preprocedure Evaluation (Addendum)
Anesthesia Evaluation  Patient identified by MRN, date of birth, ID band Patient awake    Reviewed: Allergy & Precautions, NPO status , Patient's Chart, lab work & pertinent test results  Airway Mallampati: II  TM Distance: >3 FB Neck ROM: Full    Dental  (+) Upper Dentures   Pulmonary neg pulmonary ROS, asthma , Current Smoker,    Pulmonary exam normal breath sounds clear to auscultation       Cardiovascular negative cardio ROS Normal cardiovascular exam Rhythm:Regular Rate:Normal     Neuro/Psych negative neurological ROS  negative psych ROS   GI/Hepatic negative GI ROS, Neg liver ROS,   Endo/Other  negative endocrine ROS  Renal/GU negative Renal ROS  negative genitourinary   Musculoskeletal negative musculoskeletal ROS (+)   Abdominal   Peds negative pediatric ROS (+)  Hematology negative hematology ROS (+)   Anesthesia Other Findings   Reproductive/Obstetrics negative OB ROS                            Anesthesia Physical Anesthesia Plan  ASA: II  Anesthesia Plan: MAC   Post-op Pain Management:    Induction: Intravenous  Airway Management Planned: Natural Airway and Nasal Cannula  Additional Equipment:   Intra-op Plan:   Post-operative Plan:   Informed Consent: I have reviewed the patients History and Physical, chart, labs and discussed the procedure including the risks, benefits and alternatives for the proposed anesthesia with the patient or authorized representative who has indicated his/her understanding and acceptance.   Dental advisory given  Plan Discussed with: CRNA  Anesthesia Plan Comments:         Anesthesia Quick Evaluation

## 2016-08-02 NOTE — Anesthesia Postprocedure Evaluation (Signed)
Anesthesia Post Note  Patient: Chris Randall  Procedure(s) Performed: Procedure(s) (LRB): ESOPHAGOGASTRODUODENOSCOPY (EGD) WITH PROPOFOL (N/A)  Patient location during evaluation: PACU Anesthesia Type: MAC Level of consciousness: awake and alert Pain management: pain level controlled Vital Signs Assessment: post-procedure vital signs reviewed and stable Respiratory status: spontaneous breathing, nonlabored ventilation and respiratory function stable Cardiovascular status: stable and blood pressure returned to baseline Anesthetic complications: no       Last Vitals:  Vitals:   08/02/16 1150 08/02/16 1200  BP: (!) 104/56 (!) 108/54  Pulse: 67 82  Resp: 16 13  Temp:      Last Pain:  Vitals:   08/02/16 1223  TempSrc:   PainSc: 10-Worst pain ever                 Lynda Rainwater

## 2016-08-02 NOTE — Anesthesia Procedure Notes (Signed)
Procedure Name: MAC Date/Time: 08/02/2016 11:20 AM Performed by: West Pugh Pre-anesthesia Checklist: Patient identified, Emergency Drugs available, Suction available, Patient being monitored and Timeout performed Patient Re-evaluated:Patient Re-evaluated prior to inductionOxygen Delivery Method: Nasal cannula Placement Confirmation: CO2 detector and positive ETCO2 Dental Injury: Teeth and Oropharynx as per pre-operative assessment

## 2016-08-02 NOTE — Transfer of Care (Signed)
Immediate Anesthesia Transfer of Care Note  Patient: Chris Randall  Procedure(s) Performed: Procedure(s): ESOPHAGOGASTRODUODENOSCOPY (EGD) WITH PROPOFOL (N/A)  Patient Location: PACU  Anesthesia Type:MAC  Level of Consciousness:  sedated, patient cooperative and responds to stimulation  Airway & Oxygen Therapy:Patient Spontanous Breathing and Patient connected to face mask oxgen  Post-op Assessment:  Report given to PACU RN and Post -op Vital signs reviewed and stable  Post vital signs:  Reviewed and stable  Last Vitals:  Vitals:   08/02/16 1149 08/02/16 1150  BP: 102/61 (!) 104/56  Pulse: 69 67  Resp: (!) 21 16  Temp: 46.6 C     Complications: No apparent anesthesia complications

## 2016-08-02 NOTE — Op Note (Signed)
[facility] Patient Name: Chris Randall Procedure Date: 08/02/2016 MRN: [id number] Attending MD: Iva Boop , MD Date of Birth: 10-18-1975 CSN: [id number] Age: 41 Admit Type: Inpatient Procedure:                Upper GI endoscopy Indications:              Hematemesis Providers:                Iva Boop, MD, Omelia Blackwater RN, RN, Lorenda Ishihara, Technician, Kym Groom, CRNA Referring MD:              Medicines:                Propofol per Anesthesia, Monitored Anesthesia Care Complications:            No immediate complications. Estimated Blood Loss:     Estimated blood loss was minimal. Procedure:                Pre-Anesthesia Assessment:                           - Prior to the procedure, a History and Physical                            was performed, and patient medications and                            allergies were reviewed. The patient's tolerance of                            previous anesthesia was also reviewed. The risks                            and benefits of the procedure and the sedation                            options and risks were discussed with the patient.                            All questions were answered, and informed consent                            was obtained. Prior Anticoagulants: The patient has                            taken no previous anticoagulant or antiplatelet                            agents. ASA Grade Assessment: II - A patient with                            mild systemic disease. After reviewing the risks  and benefits, the patient was deemed in                            satisfactory condition to undergo the procedure.                           After obtaining informed consent, the endoscope was                            passed under direct vision. Throughout the                            procedure, the patient's blood pressure, pulse, and                      oxygen saturations were monitored continuously. The                            EG-[id number] ([id number] scope was introduced through the                            mouth, and advanced to the second part of duodenum.                            The upper GI endoscopy was accomplished without                            difficulty. The patient tolerated the procedure                            well. Scope In: Scope Out: Findings:      LA Grade B (one or more mucosal breaks greater than 5 mm, not extending       between the tops of two mucosal folds) esophagitis with no bleeding was       found in the distal esophagus. Biopsies were taken with a cold forceps       for histology. Verification of patient identification for the specimen       was done. Estimated blood loss was minimal.      Red blood was found in the stomach. Small amount fresh blood flushed and       suctioned to reveal bleeding from antrum low-grade      Patchy moderate inflammation with hemorrhage characterized by erosions       was found in the gastric antrum. Biopsies were taken with a cold forceps       for histology. Verification of patient identification for the specimen       was done. Estimated blood loss was minimal.      A 4 cm hiatal hernia was present.      The exam was otherwise without abnormality.      The cardia and gastric fundus were normal on retroflexion. Impression:               - LA Grade B reflux esophagitis. Biopsied. Probably                            has underlying  Barrett's and would rescope at some                            point after suspect he is healed                           - Red blood in the stomach.                           - Acute gastritis with hemorrhage. Biopsied. cause                            of bleed not esophagitis                           - 4 cm hiatal hernia.                           - The examination was otherwise normal. Moderate Sedation:      N/A- Per  Anesthesia Care Recommendation:           - Return patient to hospital ward for ongoing care.                           - Clear liquid diet.                           - in AM go to bid PPI                           have checked HCV Ab and Hep B studiesalso given                            abnl LFT's                           - Continue present medications. Procedure Code(s):        --- Professional ---                           [phone], Esophagogastroduodenoscopy, flexible,                            transoral; with biopsy, single or multiple Diagnosis Code(s):        --- Professional ---                           K21.0, Gastro-esophageal reflux disease with                            esophagitis                           K92.2, Gastrointestinal hemorrhage, unspecified                           K29.01, Acute gastritis with bleeding  K44.9, Diaphragmatic hernia without obstruction or                            gangrene                           K92.0, Hematemesis CPT copyright 2016 [employer]. All rights reserved. The codes documented in this report are preliminary and upon coder review may  be revised to meet current compliance requirements. Iva Boop, MD 08/02/2016 11:47:00 AM This report has been signed electronically. Number of Addenda: 0

## 2016-08-02 NOTE — ED Provider Notes (Signed)
[facility]-EMERGENCY DEPT Provider Note   CSN: [id number] Arrival date & time: 08/02/16  0246   By signing my name below, I, Clovis Pu, attest that this documentation has been prepared under the direction and in the presence of Devoria Albe, MD  Electronically Signed: Clovis Pu, ED Scribe. 08/02/16. 3:45 AM.  Time seen 03:32 AM   History   Chief Complaint Chief Complaint  Patient presents with  . Abdominal Pain   The history is provided by the patient. No language interpreter was used.   HPI Comments:  Chris Randall is a 41 y.o. male, with a PMHx of esophageal varices and ulcer disease, who presents to the Emergency Department complaining of acute onset, stabbing, lower abdominal pain which began 1 hour PTA. Pt states it feels like his stomach is ripping apart. His pain is worse with ambulation and standing straight. His pain is mildly better with bending over. Pt also reports nausea and vomiting. He notes he ate an [employer] sandwich without cheese at 5 PM yesterday. Pt states he is supposed to be taking Protonix but notes he has not been able to get this medication because he does not have insurance and notes he last took it 1 week ago. He took Nexium tonight which provided temporary relief. Pt denies diarrhea, rectal or vomiting blood  or any other associated symptoms.He has not had this pain before.  Pt is a smoker, 10 cigarettes a day.   He states he was evaluated at [facility] in [place] about 2 years ago. He had endoscopy done at that time which showed ulcer disease and varices.He states he has had GERD symptoms in the past and upper abdominal pain.   PCP none  Past Medical History:  Diagnosis Date  . Asthma   . Bronchitis   . Esophageal varices (HCC)     There are no active problems to display for this patient.   Past Surgical History:  Procedure Laterality Date  . ROTATOR CUFF REPAIR  1995  . SHOULDER SURGERY         Home Medications    Prior to  Admission medications   Medication Sig Start Date End Date Taking? Authorizing Provider  albuterol (PROVENTIL HFA;VENTOLIN HFA) 108 (90 Base) MCG/ACT inhaler Inhale 1-2 puffs into the lungs every 6 (six) hours as needed for wheezing or shortness of breath. 05/04/16  Yes Dione Booze, MD  dicyclomine (BENTYL) 20 MG tablet Take 1 tablet (20 mg total) by mouth 2 (two) times daily. Patient not taking: Reported on 04/24/2015 11/28/14   Loren Racer, MD  methocarbamol (ROBAXIN) 500 MG tablet Take 1 tablet (500 mg total) by mouth 2 (two) times daily as needed for muscle spasms. Patient not taking: Reported on 05/04/2016 05/08/15   [name], PA-C  metoCLOPramide (REGLAN) 10 MG tablet Take 1 tablet (10 mg total) by mouth every 6 (six) hours. Patient not taking: Reported on 05/04/2016 04/24/15   Arby Barrette, MD  ondansetron (ZOFRAN ODT) 4 MG disintegrating tablet 4mg  ODT q4 hours prn nausea/vomit Patient not taking: Reported on 04/24/2015 11/28/14   Loren Racer, MD  oxyCODONE-acetaminophen (PERCOCET) 7.5-325 MG tablet Take 1 tablet by mouth every 4 (four) hours as needed for severe pain. Patient not taking: Reported on 08/02/2016 05/04/16   Dione Booze, MD  oxyCODONE-acetaminophen (PERCOCET/ROXICET) 5-325 MG tablet Take 2 tablets by mouth every 4 (four) hours as needed for severe pain. Patient not taking: Reported on 05/04/2016 04/16/16   Rise Mu, PA-C  pantoprazole (PROTONIX) 20  MG tablet Take 1 tablet (20 mg total) by mouth 2 (two) times daily. Patient not taking: Reported on 08/02/2016 05/04/16   Dione Booze, MD    Family History Family History  Problem Relation Age of Onset  . Hypertension Other   . Cancer Other     Social History Social History  Substance Use Topics  . Smoking status: Current Every Day Smoker    Packs/day: 0.50    Types: Cigarettes  . Smokeless tobacco: Current User     Comment: 3 cigarettes a day  . Alcohol use Yes     Comment: occasional      Allergies   Ibuprofen and Vicodin [hydrocodone-acetaminophen]   Review of Systems Review of Systems  Constitutional: Negative for fever.  Gastrointestinal: Positive for abdominal pain, nausea and vomiting. Negative for diarrhea.  All other systems reviewed and are negative.    Physical Exam Updated Vital Signs BP 142/93 (BP Location: Left Arm)   Pulse 114   Temp 98.1 F (36.7 C) (Oral)   Resp 22   Ht 5\' 4"  (1.626 m)   Wt 195 lb (88.5 kg)   SpO2 97%   BMI 33.47 kg/m   Vital signs normal except for tachycardia   Physical Exam  Constitutional: He is oriented to person, place, and time. He appears well-developed and well-nourished.  Non-toxic appearance. He does not appear ill. He appears distressed.  Dry heaving, sitting on side of stretcher bent over  HENT:  Head: Normocephalic and atraumatic.  Right Ear: External ear normal.  Left Ear: External ear normal.  Nose: Nose normal. No mucosal edema or rhinorrhea.  Mouth/Throat: Oropharynx is clear and moist and mucous membranes are normal. No dental abscesses or uvula swelling.  Eyes: Conjunctivae and EOM are normal. Pupils are equal, round, and reactive to light.  Neck: Normal range of motion and full passive range of motion without pain. Neck supple.  Cardiovascular: Normal rate, regular rhythm and normal heart sounds.  Exam reveals no gallop and no friction rub.   No murmur heard. Pulmonary/Chest: Effort normal and breath sounds normal. No respiratory distress. He has no wheezes. He has no rhonchi. He has no rales. He exhibits no tenderness and no crepitus.  Abdominal: Soft. Normal appearance. He exhibits no distension. Bowel sounds are decreased. There is tenderness in the right lower quadrant, suprapubic area and left lower quadrant. There is no rebound and no guarding.    Musculoskeletal: Normal range of motion. He exhibits no edema or tenderness.  Moves all extremities well.   Neurological: He is alert and  oriented to person, place, and time. He has normal strength. No cranial nerve deficit.  Skin: Skin is warm, dry and intact. No rash noted. No erythema. No pallor.  Psychiatric: His behavior is normal. His mood appears anxious. His speech is rapid and/or pressured.  Nursing note and vitals reviewed.    ED Treatments / Results  Labs (all labs ordered are listed, but only abnormal results are displayed) Results for orders placed or performed during the hospital encounter of 08/02/16  Lipase, blood  Result Value Ref Range   Lipase 21 11 - 51 U/L  Comprehensive metabolic panel  Result Value Ref Range   Sodium 136 135 - 145 mmol/L   Potassium 3.6 3.5 - 5.1 mmol/L   Chloride 104 101 - 111 mmol/L   CO2 24 22 - 32 mmol/L   Glucose, Bld 123 (H) 65 - 99 mg/dL   BUN 11 6 - 20 mg/dL  Creatinine, Ser 1.10 0.61 - 1.24 mg/dL   Calcium 8.6 (L) 8.9 - 10.3 mg/dL   Total Protein 7.1 6.5 - 8.1 g/dL   Albumin 4.1 3.5 - 5.0 g/dL   AST 56 (H) 15 - 41 U/L   ALT 100 (H) 17 - 63 U/L   Alkaline Phosphatase 92 38 - 126 U/L   Total Bilirubin 0.7 0.3 - 1.2 mg/dL   GFR calc non Af Amer >60 >60 mL/min   GFR calc Af Amer >60 >60 mL/min   Anion gap 8 5 - 15  CBC  Result Value Ref Range   WBC 6.9 4.0 - 10.5 K/uL   RBC 5.43 4.22 - 5.81 MIL/uL   Hemoglobin 14.8 13.0 - 17.0 g/dL   HCT 14.745.6 82.939.0 - 56.252.0 %   MCV 84.0 78.0 - 100.0 fL   MCH 27.3 26.0 - 34.0 pg   MCHC 32.5 30.0 - 36.0 g/dL   RDW 13.013.0 86.511.5 - 78.415.5 %   Platelets 233 150 - 400 K/uL   Laboratory interpretation all normal except mild elevation of his LFT's     Radiology Ct Abdomen Pelvis W Contrast  Result Date: 08/02/2016 CLINICAL DATA:  Abdominal pain with nausea and vomiting EXAM: CT ABDOMEN AND PELVIS WITH CONTRAST TECHNIQUE: Multidetector CT imaging of the abdomen and pelvis was performed using the standard protocol following bolus administration of intravenous contrast. CONTRAST:  100 mL Isovue 370 nonionic COMPARISON:  April 16, 2015  FINDINGS: Lower chest: Lung bases are clear. There is a distal hiatal hernia. A somewhat serpiginous appearance in this area suggests a degree of underlying distal esophageal varices. Hepatobiliary: There is fatty infiltration near the fissure for the ligamentum teres. There is an 8 mm cyst in the posterior segment of the right lobe of the liver. No other focal liver lesions are evident. Gallbladder wall is not appreciably thickened. There is no biliary duct dilatation. Pancreas: There is no pancreatic mass or inflammatory focus. Spleen: No splenic lesions are evident. There is a small accessory spleen between the spleen and lateral left kidney peer Adrenals/Urinary Tract: Adrenals appear normal bilaterally. Kidneys bilaterally show no demonstrable mass or hydronephrosis on either side. There is no renal or ureteral calculus on either side. Urinary bladder is midline with wall thickness within normal limits. Stomach/Bowel: There are multiple sigmoid diverticula and descending colonic diverticula without diverticulitis. There is no bowel wall or mesenteric thickening. No bowel obstruction or free air. Vascular/Lymphatic: There is no abdominal aortic aneurysm. There are foci of atherosclerotic calcification at the aortic bifurcation and proximal iliac arteries. Major mesenteric vessels appear patent. There is no appreciable adenopathy in the abdomen or pelvis. Reproductive: There are a few small prostatic calculi. Prostate and seminal vesicles are normal in size and contour. Other: Appendix appears normal. No ascites or abscess evident in the abdomen or pelvis. There is a small ventral hernia containing only fat. Musculoskeletal: There are no blastic or lytic bone lesions. No intramuscular or abdominal wall lesion. IMPRESSION: Distal hiatal hernia with suspected distal esophageal varices. Minimal ventral hernia containing only fat. Multiple distal colonic diverticula without diverticulitis. No bowel obstruction. No  abscess. Appendix appears normal. No renal or ureteral calculus. No hydronephrosis. There are occasional prostatic calculi. Focal fatty infiltration in the region of the fissure for the ligamentum teres. Electronically Signed   By: Bretta BangWilliam  Woodruff III M.D.   On: 08/02/2016 07:15   Dg Abdomen Acute W/chest  Result Date: 08/02/2016 CLINICAL DATA:  Severe abdominal pain, nausea and vomiting  with acute onset 2 hours ago. History of ulcers. EXAM: DG ABDOMEN ACUTE W/ 1V CHEST COMPARISON:  Chest and pelvis 05/04/2016. CT abdomen and pelvis 04/16/2015 FINDINGS: Shallow inspiration. Normal heart size and pulmonary vascularity. Suggestion of infiltration in the right lung base, possibly pneumonia. No blunting of costophrenic angles. No pneumothorax. Mediastinal contours appear intact. Scattered gas and stool throughout the colon. No small or large bowel distention. No free intra-abdominal air. No abnormal air-fluid levels. No radiopaque stones. Visualized bones appear intact. IMPRESSION: Suggestion of infiltration in the right lung base, possibly pneumonia. Normal nonobstructive bowel gas pattern. Electronically Signed   By: Burman Nieves M.D.   On: 08/02/2016 04:27    Procedures Procedures (including critical care time)  Medications Ordered in ED Medications  pantoprazole (PROTONIX) 80 mg in sodium chloride 0.9 % 250 mL (0.32 mg/mL) infusion (8 mg/hr Intravenous New Bag/Given 08/02/16 0505)  pantoprazole (PROTONIX) injection 40 mg (not administered)  dicyclomine (BENTYL) injection 20 mg (not administered)  ondansetron (ZOFRAN) injection 4 mg (4 mg Intravenous Given 08/02/16 0314)  fentaNYL (SUBLIMAZE) injection 50 mcg (50 mcg Intravenous Given 08/02/16 0430)  pantoprazole (PROTONIX) 80 mg in sodium chloride 0.9 % 100 mL IVPB (0 mg Intravenous Stopped 08/02/16 0505)  HYDROmorphone (DILAUDID) injection 0.5 mg (0.5 mg Intravenous Given 08/02/16 0622)  ondansetron (ZOFRAN) injection 4 mg (4 mg Intravenous  Given 08/02/16 0636)  iopamidol (ISOVUE-300) 61 % injection 100 mL (100 mLs Intravenous Contrast Given 08/02/16 0641)     Initial Impression / Assessment and Plan / ED Course  I have reviewed the triage vital signs and the nursing notes.  Pertinent labs & imaging results that were available during my care of the patient were reviewed by me and considered in my medical decision making (see chart for details).     DIAGNOSTIC STUDIES:  Oxygen Saturation is 97% on RA, normal by my interpretation.    COORDINATION OF CARE:  3:37 AM Discussed treatment plan with pt at bedside and pt agreed to plan. Pt was given IV fluids, IV pain and nausea medications.   04:10 AM nurse reports patient vomited some blood. He has a lot of clear fluid that is blood tinged. Pt was started on protonix IV bolus and drip.   07:14 AM no more vomiting blood, waiting for the AP CT to result.   After reviewing his CT scan I felt patient should be admitted at least for observation to make sure he has no more GI bleeding. Due to the amount of dry heaving he was having when he came to the ED he may have had a Mallory-Weiss tear, however he does have a history of varices. He has had no further episodes of vomiting while in the ED. The CT does not elicit a reason for his lower abdominal discomfort.  07:45 AM Dr Waymon Amato, hospitalist, will admit  Final Clinical Impressions(s) / ED Diagnoses   Final diagnoses:  Lower abdominal pain  Non-intractable vomiting with nausea, unspecified vomiting type  Hematemesis with nausea   Plan admission  Devoria Albe, MD, FACEP   I personally performed the services described in this documentation, which was scribed in my presence. The recorded information has been reviewed and considered.  Devoria Albe, MD, Concha Pyo, MD 08/02/16 [phone]

## 2016-08-02 NOTE — ED Triage Notes (Signed)
Pt presents with c/o sever abdominal pain with nausea and vomiting, onset 2 hours ago.  Pt states he has hx of ulcers.  Arrived to room with active emesis-- noted undigested food in the emesis, no s/s blood noted.

## 2016-08-02 NOTE — Consult Note (Signed)
Consultation  Referring Provider: Dr. Waymon Amato  Primary Care Physician:  No PCP Per Patient Primary Gastroenterologist:  [facility] Chris Randall       Reason for Consultation: Hematemesis              HPI:   Chris Randall is a 41 y.o. male the past medical history of esophageal varices, ulcer disease and alcoholism, who presented to the ED on 08/02/2016 with a complaint of abdominal pain with acute onset around 10 PM last night.   Today, the patient tells me that around 15 years ago he was an "alcohol drinker" he was diagnosed with ulcer disease and varices via EGD around that time. Patient tells me he quit drinking. He was placed on Protonix 40 mg twice a day and has been taking this for years. He tells me that over the past 5-10 years he has been doing well but ran out of his medication due to a loss of Medicaid recently and has not been on his medicine for a month "or so". Patient does relate a story of eating [employer] sandwich last night around 10 PM and an hour later developing nausea, sweating and vomiting. Patient tells me he took "the powdered Nexium", but vomited this up. He then had an episode of hematemesis. Patient presented to the ER and had 3 more episodes of vomiting with bright red blood. He tells me he vomited a total of 3 times in the ER. Associated symptoms include an abdominal pain from "my bellybutton up". This has been occurring off and on for 10 years but is typically okay with his Protonix. Patient also tells me he feels somewhat dizzy.   Patient tells me his bowel movements have been loose and "black", for 3 months. Apparently a sister who is a nurse looked at them and told him "that's not blood".   Social history is positive for being a father and very proud of this fact.   Patient denies fever, chills, bright red blood in his stool or syncope.  Past GI History: -EGD 210-15 years ago, when at [facility] and one [facility] [place]: The patient  relays a history of esophageal varices and ulcer disease as well as "black spots"  Past Medical History:  Diagnosis Date  . Asthma   . Bronchitis   . Esophageal varices (HCC)     Past Surgical History:  Procedure Laterality Date  . ROTATOR CUFF REPAIR  1995  . SHOULDER SURGERY      Family History  Problem Relation Age of Onset  . Hypertension Other   . Cancer Other     Social History  Substance Use Topics  . Smoking status: Current Every Day Smoker    Packs/day: 0.50    Types: Cigarettes  . Smokeless tobacco: Current User     Comment: 3 cigarettes a day  . Alcohol use Yes     Comment: occasional    Prior to Admission medications   Medication Sig Start Date End Date Taking? Authorizing Provider  albuterol (PROVENTIL HFA;VENTOLIN HFA) 108 (90 Base) MCG/ACT inhaler Inhale 1-2 puffs into the lungs every 6 (six) hours as needed for wheezing or shortness of breath. 05/04/16  Yes Dione Booze, MD    Current Facility-Administered Medications  Medication Dose Route Frequency Provider Last Rate Last Dose  . 0.9 %  sodium chloride infusion   Intravenous Continuous Elease Etienne, MD 125 mL/hr at 08/02/16 [phone]    . albuterol (PROVENTIL) (2.5 MG/3ML)  0.083% nebulizer solution 2.5 mg  2.5 mg Nebulization Q2H PRN Elease Etienne, MD      . HYDROmorphone (DILAUDID) injection 0.5 mg  0.5 mg Intravenous Q3H PRN Elease Etienne, MD   0.5 mg at 08/02/16 0844  . [START ON 08/03/2016] Influenza vac split quadrivalent PF (FLUARIX) injection 0.5 mL  0.5 mL Intramuscular Tomorrow-1000 Elease Etienne, MD      . nicotine (NICODERM CQ - dosed in mg/24 hours) patch 14 mg  14 mg Transdermal Daily Elease Etienne, MD      . ondansetron (ZOFRAN) tablet 4 mg  4 mg Oral Q6H PRN Elease Etienne, MD       Or  . ondansetron (ZOFRAN) injection 4 mg  4 mg Intravenous Q6H PRN Elease Etienne, MD      . pantoprazole (PROTONIX) 80 mg in sodium chloride 0.9 % 250 mL (0.32 mg/mL) infusion  8 mg/hr  Intravenous Continuous Devoria Albe, MD 25 mL/hr at 08/02/16 0505 8 mg/hr at 08/02/16 0505  . [START ON 08/05/2016] pantoprazole (PROTONIX) injection 40 mg  40 mg Intravenous Q12H Devoria Albe, MD        Allergies as of 08/02/2016 - Review Complete 08/02/2016  Allergen Reaction Noted  . Ibuprofen Other (See Comments) 05/04/2016  . Vicodin [hydrocodone-acetaminophen] Other (See Comments) 09/21/2011     Review of Systems:    Constitutional: No weight loss, fever or chills Skin: No rash  Cardiovascular: No chest pain Respiratory: No SOB Gastrointestinal: See HPI and otherwise negative Genitourinary: No dysuria Neurological: Positive for dizziness Musculoskeletal: No new muscle pain Hematologic: See HPI Psychiatric: No history of depression or anxiety   Physical Exam:  Vital signs in last 24 hours: Temp:  [97.8 F (36.6 C)-98.1 F (36.7 C)] 97.8 F (36.6 C) (03/12 0939) Pulse Rate:  [74-114] 74 (03/12 0939) Resp:  [18-22] 20 (03/12 0939) BP: (93-142)/(56-93) 127/56 (03/12 0939) SpO2:  [95 %-99 %] 98 % (03/12 0939) Weight:  [195 lb (88.5 kg)] 195 lb (88.5 kg) (03/12 0252) Last BM Date: 08/02/16 General: Caucasian male appears to be in NAD, Well developed, Well nourished, alert and cooperative Head:  Normocephalic and atraumatic. Eyes:   PEERL, EOMI. No icterus. Conjunctiva pink. Ears:  Normal auditory acuity. Neck:  Supple Throat: Oral cavity and pharynx without inflammation, swelling or lesion. Teeth in good condition. Lungs: Respirations even and unlabored. Lungs clear to auscultation bilaterally.   No wheezes, crackles, or rhonchi.  Heart: Normal S1, S2. No MRG. Regular rate and rhythm. No peripheral edema, cyanosis or pallor.  Abdomen:  Soft, mild distension, Moderate ttp in upper abdomen, No rebound or guarding. Normal bowel sounds. No appreciable masses or hepatomegaly. Rectal:  Not performed.  Msk:  Symmetrical without gross deformities.  Extremities:  Without edema, no  deformity or joint abnormality.  Neurologic:  Alert and  oriented x4;  grossly normal neurologically.  Skin:   Dry and intact without significant lesions or rashes. Psychiatric: Demonstrates good judgement and reason without abnormal affect or behaviors.   LAB RESULTS:  Recent Labs  08/02/16 0315  WBC 6.9  HGB 14.8  HCT 45.6  PLT 233   BMET  Recent Labs  08/02/16 0315  NA 136  K 3.6  CL 104  CO2 24  GLUCOSE 123*  BUN 11  CREATININE 1.10  CALCIUM 8.6*   LFT  Recent Labs  08/02/16 0315  PROT 7.1  ALBUMIN 4.1  AST 56*  ALT 100*  ALKPHOS 92  BILITOT 0.7  PT/INR  Recent Labs  08/02/16 0315  LABPROT 13.4  INR 1.02    STUDIES: Ct Abdomen Pelvis W Contrast  Result Date: 08/02/2016 CLINICAL DATA:  Abdominal pain with nausea and vomiting EXAM: CT ABDOMEN AND PELVIS WITH CONTRAST TECHNIQUE: Multidetector CT imaging of the abdomen and pelvis was performed using the standard protocol following bolus administration of intravenous contrast. CONTRAST:  100 mL Isovue 370 nonionic COMPARISON:  April 16, 2015 FINDINGS: Lower chest: Lung bases are clear. There is a distal hiatal hernia. A somewhat serpiginous appearance in this area suggests a degree of underlying distal esophageal varices. Hepatobiliary: There is fatty infiltration near the fissure for the ligamentum teres. There is an 8 mm cyst in the posterior segment of the right lobe of the liver. No other focal liver lesions are evident. Gallbladder wall is not appreciably thickened. There is no biliary duct dilatation. Pancreas: There is no pancreatic mass or inflammatory focus. Spleen: No splenic lesions are evident. There is a small accessory spleen between the spleen and lateral left kidney peer Adrenals/Urinary Tract: Adrenals appear normal bilaterally. Kidneys bilaterally show no demonstrable mass or hydronephrosis on either side. There is no renal or ureteral calculus on either side. Urinary bladder is midline with  wall thickness within normal limits. Stomach/Bowel: There are multiple sigmoid diverticula and descending colonic diverticula without diverticulitis. There is no bowel wall or mesenteric thickening. No bowel obstruction or free air. Vascular/Lymphatic: There is no abdominal aortic aneurysm. There are foci of atherosclerotic calcification at the aortic bifurcation and proximal iliac arteries. Major mesenteric vessels appear patent. There is no appreciable adenopathy in the abdomen or pelvis. Reproductive: There are a few small prostatic calculi. Prostate and seminal vesicles are normal in size and contour. Other: Appendix appears normal. No ascites or abscess evident in the abdomen or pelvis. There is a small ventral hernia containing only fat. Musculoskeletal: There are no blastic or lytic bone lesions. No intramuscular or abdominal wall lesion. IMPRESSION: Distal hiatal hernia with suspected distal esophageal varices. Minimal ventral hernia containing only fat. Multiple distal colonic diverticula without diverticulitis. No bowel obstruction. No abscess. Appendix appears normal. No renal or ureteral calculus. No hydronephrosis. There are occasional prostatic calculi. Focal fatty infiltration in the region of the fissure for the ligamentum teres. Electronically Signed   By: Bretta BangWilliam  Woodruff III M.D.   On: 08/02/2016 07:15   Dg Abdomen Acute W/chest  Result Date: 08/02/2016 CLINICAL DATA:  Severe abdominal pain, nausea and vomiting with acute onset 2 hours ago. History of ulcers. EXAM: DG ABDOMEN ACUTE W/ 1V CHEST COMPARISON:  Chest and pelvis 05/04/2016. CT abdomen and pelvis 04/16/2015 FINDINGS: Shallow inspiration. Normal heart size and pulmonary vascularity. Suggestion of infiltration in the right lung base, possibly pneumonia. No blunting of costophrenic angles. No pneumothorax. Mediastinal contours appear intact. Scattered gas and stool throughout the colon. No small or large bowel distention. No free  intra-abdominal air. No abnormal air-fluid levels. No radiopaque stones. Visualized bones appear intact. IMPRESSION: Suggestion of infiltration in the right lung base, possibly pneumonia. Normal nonobstructive bowel gas pattern. Electronically Signed   By: Burman NievesWilliam  Stevens M.D.   On: 08/02/2016 04:27     PREVIOUS ENDOSCOPIES:            See HPI   Impression / Plan:   Impression: 1. Hematemesis:History of the same 10 years ago, related history of ulcer disease and esophageal varices EGD at that time, typically maintained on protonix 40 twice a day, ran out over the past  month, acute onset of abdominal pain with vomiting and then hematemesis after greasy meal, last in the ER; consider Mallory-Weiss tear versus variceal bleed versus esophagitis versus gastritis versus other 2. Abdominal Pain: With above, likely related 3. Elevated LFT's: Consider relation to positive cocaine on tox screen in past  vs alcohol use vs other 4. Positive tox screen: For cocaine and benzodiazepines  Plan: 1. Scheduled patient for an EGD with Dr. Leone Payor this morning. 2. Continue twice a day PPI 3. Patient to remain nothing by mouth until after time procedure 4. Please await further recommendations from Dr. Leone Payor after her procedure  Thank you for your kind consultation, we will continue to follow.  Violet Baldy Lemmon  08/02/2016, 10:08 AM Pager #: [phone]     [facility] Attending   I have taken an interval history, reviewed the chart and examined the patient. I agree with the Advanced Practitioner's note, impression and recommendations.   EGD today - suspect worsening GERD Anticipate checking HCV Ab - does not seem that has been done in recent past Hx incarceration and cocaine use increases chances  The risks and benefits as well as alternatives of endoscopic procedure(s) have been discussed and reviewed. All questions answered. The patient agrees to proceed.  Iva Boop, MD, [facility]

## 2016-08-03 LAB — HIV ANTIBODY (ROUTINE TESTING W REFLEX): HIV Screen 4th Generation wRfx: NONREACTIVE

## 2016-08-03 LAB — CBC
HCT: 40 % (ref 39.0–52.0)
HEMOGLOBIN: 13.4 g/dL (ref 13.0–17.0)
MCH: 28.4 pg (ref 26.0–34.0)
MCHC: 33.5 g/dL (ref 30.0–36.0)
MCV: 84.7 fL (ref 78.0–100.0)
Platelets: 188 10*3/uL (ref 150–400)
RBC: 4.72 MIL/uL (ref 4.22–5.81)
RDW: 13 % (ref 11.5–15.5)
WBC: 6 10*3/uL (ref 4.0–10.5)

## 2016-08-03 MED ORDER — DICYCLOMINE HCL 20 MG PO TABS
20.0000 mg | ORAL_TABLET | Freq: Three times a day (TID) | ORAL | Status: DC
  Administered 2016-08-03 – 2016-08-05 (×6): 20 mg via ORAL
  Filled 2016-08-03 (×9): qty 1

## 2016-08-03 MED ORDER — GI COCKTAIL ~~LOC~~
30.0000 mL | Freq: Three times a day (TID) | ORAL | Status: DC | PRN
  Administered 2016-08-03 – 2016-08-04 (×3): 30 mL via ORAL
  Filled 2016-08-03 (×3): qty 30

## 2016-08-03 MED ORDER — GUAIFENESIN-DM 100-10 MG/5ML PO SYRP
5.0000 mL | ORAL_SOLUTION | ORAL | Status: DC | PRN
  Administered 2016-08-03: 5 mL via ORAL
  Filled 2016-08-03: qty 10

## 2016-08-03 NOTE — Progress Notes (Signed)
Progress Note   Subjective  Chief Complaint: Hematemesis, Epigastric Pain  Pt reports having a very sore throat and "losing his voice" today. He tells me he has been like this since time of EGD yesterday. He has had no further vomiting but does continue with an 8/10 epigastric abdominal pain that feels like a "sharp cramp".  He did eat clears for breakfast and did well. He has not had a BM since yesterday.    Objective   Vital signs in last 24 hours: Temp:  [97.6 F (36.4 C)-98.6 F (37 C)] 98.6 F (37 C) (03/13 0456) Pulse Rate:  [66-82] 70 (03/13 0456) Resp:  [12-21] 17 (03/13 0456) BP: (102-123)/(54-78) 123/72 (03/13 0456) SpO2:  [93 %-100 %] 100 % (03/13 0456) Weight:  [195 lb (88.5 kg)] 195 lb (88.5 kg) (03/12 1103) Last BM Date: 08/02/16 General:Caucasian male in NAD Heart:  Regular rate and rhythm; no murmurs Lungs: Respirations even and unlabored, lungs CTA bilaterally Abdomen:  Soft, mod epigastric ttp and nondistended. Normal bowel sounds. Extremities:  Without edema. Neurologic:  Alert and oriented,  grossly normal neurologically. Psych:  Cooperative. Normal mood and affect.  Intake/Output from previous day: 03/12 0701 - 03/13 0700 In: 3485.4 [I.V.:3485.4] Out: -   Lab Results:  Recent Labs  08/02/16 0315 08/02/16 1852 08/03/16 0334  WBC 6.9 6.9 6.0  HGB 14.8 13.0 13.4  HCT 45.6 39.0 40.0  PLT 233 176 188    Studies/Results:  EGD 08/02/16-Dr. Leone PayorGessner Findings:      LA Grade B (one or more mucosal breaks greater than 5 mm, not extending       between the tops of two mucosal folds) esophagitis with no bleeding was       found in the distal esophagus. Biopsies were taken with a cold forceps       for histology. Verification of patient identification for the specimen       was done. Estimated blood loss was minimal.      Red blood was found in the stomach. Small amount fresh blood flushed and       suctioned to reveal bleeding from antrum  low-grade      Patchy moderate inflammation with hemorrhage characterized by erosions       was found in the gastric antrum. Biopsies were taken with a cold forceps       for histology. Verification of patient identification for the specimen       was done. Estimated blood loss was minimal.      A 4 cm hiatal hernia was present.      The exam was otherwise without abnormality.      The cardia and gastric fundus were normal on retroflexion. Impression:               - LA Grade B reflux esophagitis. Biopsied. Probably                            has underlying Barrett's and would rescope at some                            point after suspect he is healed                           - Red blood in the stomach.                           -  Acute gastritis with hemorrhage. Biopsied. cause                            of bleed not esophagitis                           - 4 cm hiatal hernia.                           - The examination was otherwise normal. Moderate Sedation:      N/A- Per Anesthesia Care Recommendation:           - Return patient to hospital ward for ongoing care.                           - Clear liquid diet.                           - in AM go to bid PPI                           have checked HCV Ab and Hep B studiesalso given                            abnl LFT's                           -continue present medications   Assessment / Plan:   Assessment: 1. Hematemesis: no further hematemesis, EGD yest showing erosive esophagitis and acute gastritis 2. Abdominal Pain: related to above, continue today per patient 3. Elevated LFT's: hepatitis C and B pending 4. Positive tox screen: for cocaine in past + says he quit  Plan: 1. Continue BID PPI 2. Continue prn GI cocktail 3. Continue clears for now 4. Awaiting bx results from EGD yesterday, concerns for Barrett's esophagus discussed with the patient today 5. Awaiting Hep C and B results 6. Please await any further recs from Dr.  Leone Payor  Thank you for your kind consultation, we will continue to follow.   LOS: 0 days   Unk Lightning  08/03/2016, 9:56 AM  Pager # [phone]    [facility] Attending   I have taken an interval history, reviewed the chart and examined the patient. I agree with the Advanced Practitioner's note, impression and recommendations.    Overall better - has kept clears down mostly but did vomit last night Hoarseness raises ? Viral syndrome - not typical for EGD to cause this long sxs of hoarseness Denies myalgia  Add dicyclomine  Continue supportive care Try to advance diet I will f/u bxs and set up outpt f/u  Iva Boop, MD, [facility] [phone] (pager) [phone] after 5 PM, weekends and holidays  08/03/2016 11:01 AM

## 2016-08-03 NOTE — Progress Notes (Signed)
PROGRESS NOTE  Chris Randall ZOX:[id number] DOB: [birth year] DOA: 08/02/2016 PCP: No PCP Per Patient   LOS: 0 days   Brief Narrative: 41 year old male, single, lives with family, independent of activities of daily living, works in the tree cutting business, PMH of controlled asthma, reported esophageal varices, GERD and GI bleed, first diagnosed at age 27 by EGD and claims to have quit alcohol abuse since, tobacco abuse, presented to the [facility] ED on 08/02/16 with complaints of abdominal pain and hematemesis. GI consulted.  Assessment & Plan: Principal Problem:   Hematemesis/vomiting blood Active Problems:   Abdominal pain   Tobacco abuse   Asthma   Esophageal varices determined by endoscopy (HCC)   Abnormal transaminases   Erosive esophagitis   Erosive gastritis with hemorrhage   Acute abdominal pain with vomiting and an episode of hematemesis  -Gastroenterology was consulted, patient underwent an EGD on 3/12 which showed severe acute gastritis with hemorrhage, continue PPI infusion per GI -Patient has difficulties eating today, she had some clear liquids last night however had emesis after those -Attempted full liquid today, patient was barely able to get few bites which exacerbated his abdominal pain  GERD -Tx as above.  Tobacco abuse -Cessation counseled. Nicotine patch as per patient's request.  Asthma -Stable without clinical bronchospasm. When necessary albuterol nebulizations.  History of esophageal varices:  -Not a known cirrhotic, no EDV is noted on the EGD  Hemoglobin urea in the absence of hematuria: Unclear etiology. Outpatient follow-up. No urinary symptoms reported.  Sore throat  - viral vs post procedure. Monitor. Chlorasept  DVT prophylaxis: SCD Code Status: Full code Family Communication: no family bedside Disposition Plan: home 1 day   Consultants:   GI  Procedures:   EGD 3/12 Impression:               - LA Grade B reflux  esophagitis. Biopsied. Probably                            has underlying Barrett's and would rescope at some                            point after suspect he is healed                           - Red blood in the stomach.                           - Acute gastritis with hemorrhage. Biopsied. cause                            of bleed not esophagitis                           - 4 cm hiatal hernia.                           - The examination was otherwise normal. Moderate Sedation:      N/A- Per Anesthesia Care Recommendation:           - Return patient to hospital ward for ongoing care.                           -  Clear liquid diet.                           - in AM go to bid PPI  Antimicrobials:  None    Subjective: -continues to have significant abdominal pain worsened by eating, complains of a sore throat as well  Objective: Vitals:   08/02/16 1200 08/02/16 2004 08/03/16 0456 08/03/16 1410  BP: (!) 108/54 117/78 123/72 124/83  Pulse: 82 72 70 61  Resp: 13 16 17 18   Temp:  98.1 F (36.7 C) 98.6 F (37 C) 98.2 F (36.8 C)  TempSrc:  Oral Oral Oral  SpO2: 95% 97% 100% 99%  Weight:      Height:        Intake/Output Summary (Last 24 hours) at 08/03/16 1444 Last data filed at 08/03/16 1300  Gross per 24 hour  Intake          3335.41 ml  Output                1 ml  Net          3334.41 ml   Filed Weights   08/02/16 0252 08/02/16 1103  Weight: 88.5 kg (195 lb) 88.5 kg (195 lb)    Examination: Constitutional: NAD Vitals:   08/02/16 1200 08/02/16 2004 08/03/16 0456 08/03/16 1410  BP: (!) 108/54 117/78 123/72 124/83  Pulse: 82 72 70 61  Resp: 13 16 17 18   Temp:  98.1 F (36.7 C) 98.6 F (37 C) 98.2 F (36.8 C)  TempSrc:  Oral Oral Oral  SpO2: 95% 97% 100% 99%  Weight:      Height:       Eyes: PERRL, lids and conjunctivae normal ENMT: Mucous membranes are moist. No oropharyngeal exudates, but has erythematous oropharynx  Respiratory: clear to auscultation  bilaterally, no wheezing, no crackles. Normal respiratory effort. No accessory muscle use.  Cardiovascular: Regular rate and rhythm, no murmurs / rubs / gallops. No LE edema. 2+ pedal pulses.  Abdomen: mild tenderness throughout. Bowel sounds positive.  Musculoskeletal: no clubbing / cyanosis.   Neurologic: non focal    Data Reviewed: I have personally reviewed following labs and imaging studies  CBC:  Recent Labs Lab 08/02/16 0315 08/02/16 1852 08/03/16 0334  WBC 6.9 6.9 6.0  HGB 14.8 13.0 13.4  HCT 45.6 39.0 40.0  MCV 84.0 83.7 84.7  PLT 233 176 188   Basic Metabolic Panel:  Recent Labs Lab 08/02/16 0315  NA 136  K 3.6  CL 104  CO2 24  GLUCOSE 123*  BUN 11  CREATININE 1.10  CALCIUM 8.6*   GFR: Estimated Creatinine Clearance: 90.4 mL/min (by C-G formula based on SCr of 1.1 mg/dL). Liver Function Tests:  Recent Labs Lab 08/02/16 0315  AST 56*  ALT 100*  ALKPHOS 92  BILITOT 0.7  PROT 7.1  ALBUMIN 4.1    Recent Labs Lab 08/02/16 0315  LIPASE 21   No results for input(s): AMMONIA in the last 168 hours. Coagulation Profile:  Recent Labs Lab 08/02/16 0315  INR 1.02   Cardiac Enzymes: No results for input(s): CKTOTAL, CKMB, CKMBINDEX, TROPONINI in the last 168 hours. BNP (last 3 results) No results for input(s): PROBNP in the last 8760 hours. HbA1C: No results for input(s): HGBA1C in the last 72 hours. CBG: No results for input(s): GLUCAP in the last 168 hours. Lipid Profile: No results for input(s): CHOL, HDL, LDLCALC, TRIG, CHOLHDL, LDLDIRECT in the last  72 hours. Thyroid Function Tests: No results for input(s): TSH, T4TOTAL, FREET4, T3FREE, THYROIDAB in the last 72 hours. Anemia Panel: No results for input(s): VITAMINB12, FOLATE, FERRITIN, TIBC, IRON, RETICCTPCT in the last 72 hours. Urine analysis:    Component Value Date/Time   COLORURINE RED (A) 08/02/2016 0750   APPEARANCEUR HAZY (A) 08/02/2016 0750   LABSPEC 1.012 08/02/2016 0750    PHURINE 6.0 08/02/2016 0750   GLUCOSEU NEGATIVE 08/02/2016 0750   HGBUR LARGE (A) 08/02/2016 0750   BILIRUBINUR NEGATIVE 08/02/2016 0750   KETONESUR NEGATIVE 08/02/2016 0750   PROTEINUR 30 (A) 08/02/2016 0750   UROBILINOGEN 0.2 11/28/2014 0009   NITRITE NEGATIVE 08/02/2016 0750   LEUKOCYTESUR NEGATIVE 08/02/2016 0750   Sepsis Labs: Invalid input(s): PROCALCITONIN, LACTICIDVEN  No results found for this or any previous visit (from the past 240 hour(s)).    Radiology Studies: Ct Abdomen Pelvis W Contrast  Result Date: 08/02/2016 CLINICAL DATA:  Abdominal pain with nausea and vomiting EXAM: CT ABDOMEN AND PELVIS WITH CONTRAST TECHNIQUE: Multidetector CT imaging of the abdomen and pelvis was performed using the standard protocol following bolus administration of intravenous contrast. CONTRAST:  100 mL Isovue 370 nonionic COMPARISON:  April 16, 2015 FINDINGS: Lower chest: Lung bases are clear. There is a distal hiatal hernia. A somewhat serpiginous appearance in this area suggests a degree of underlying distal esophageal varices. Hepatobiliary: There is fatty infiltration near the fissure for the ligamentum teres. There is an 8 mm cyst in the posterior segment of the right lobe of the liver. No other focal liver lesions are evident. Gallbladder wall is not appreciably thickened. There is no biliary duct dilatation. Pancreas: There is no pancreatic mass or inflammatory focus. Spleen: No splenic lesions are evident. There is a small accessory spleen between the spleen and lateral left kidney peer Adrenals/Urinary Tract: Adrenals appear normal bilaterally. Kidneys bilaterally show no demonstrable mass or hydronephrosis on either side. There is no renal or ureteral calculus on either side. Urinary bladder is midline with wall thickness within normal limits. Stomach/Bowel: There are multiple sigmoid diverticula and descending colonic diverticula without diverticulitis. There is no bowel wall or  mesenteric thickening. No bowel obstruction or free air. Vascular/Lymphatic: There is no abdominal aortic aneurysm. There are foci of atherosclerotic calcification at the aortic bifurcation and proximal iliac arteries. Major mesenteric vessels appear patent. There is no appreciable adenopathy in the abdomen or pelvis. Reproductive: There are a few small prostatic calculi. Prostate and seminal vesicles are normal in size and contour. Other: Appendix appears normal. No ascites or abscess evident in the abdomen or pelvis. There is a small ventral hernia containing only fat. Musculoskeletal: There are no blastic or lytic bone lesions. No intramuscular or abdominal wall lesion. IMPRESSION: Distal hiatal hernia with suspected distal esophageal varices. Minimal ventral hernia containing only fat. Multiple distal colonic diverticula without diverticulitis. No bowel obstruction. No abscess. Appendix appears normal. No renal or ureteral calculus. No hydronephrosis. There are occasional prostatic calculi. Focal fatty infiltration in the region of the fissure for the ligamentum teres. Electronically Signed   By: Bretta Bang III M.D.   On: 08/02/2016 07:15   Dg Abdomen Acute W/chest  Result Date: 08/02/2016 CLINICAL DATA:  Severe abdominal pain, nausea and vomiting with acute onset 2 hours ago. History of ulcers. EXAM: DG ABDOMEN ACUTE W/ 1V CHEST COMPARISON:  Chest and pelvis 05/04/2016. CT abdomen and pelvis 04/16/2015 FINDINGS: Shallow inspiration. Normal heart size and pulmonary vascularity. Suggestion of infiltration in the right lung base,  possibly pneumonia. No blunting of costophrenic angles. No pneumothorax. Mediastinal contours appear intact. Scattered gas and stool throughout the colon. No small or large bowel distention. No free intra-abdominal air. No abnormal air-fluid levels. No radiopaque stones. Visualized bones appear intact. IMPRESSION: Suggestion of infiltration in the right lung base, possibly  pneumonia. Normal nonobstructive bowel gas pattern. Electronically Signed   By: Burman Nieves M.D.   On: 08/02/2016 04:27     Scheduled Meds: . dicyclomine  20 mg Oral TID AC  . nicotine  14 mg Transdermal Daily  . [START ON 08/05/2016] pantoprazole  40 mg Intravenous Q12H   Continuous Infusions: . pantoprozole (PROTONIX) infusion 8 mg/hr (08/02/16 1642)     Pamella Pert, MD, PhD [facility] Pager [phone]  If 7PM-7AM, please contact night-coverage [email or web address] Password [facility] 08/03/2016, 2:44 PM

## 2016-08-03 NOTE — Care Management Note (Signed)
Case Management Note  Patient Details  Name: Carson MyrtleStephen R Choi MRN: [id number] Date of Birth: 12/06/1975  Subjective/Objective:         etoh abuse, fatty liver, abd pain,           Action/Plan: lives at home with sister. Date:  August 03, 2016 Chart reviewed for concurrent status and case management needs. Will continue to follow patient progress. Discharge Planning: following for needs Expected discharge date: [id number] Marcelle SmilingRhonda Davis, BSN, [place]3, [place]   [phone]   Expected Discharge Date:   (unknown)               Expected Discharge Plan:  Home/Self Care  In-House Referral:     Discharge planning Services     Post Acute Care Choice:    Choice offered to:     DME Arranged:    DME Agency:     HH Arranged:    HH Agency:     Status of Service:  In process, will continue to follow  If discussed at Long Length of Stay Meetings, dates discussed:    Additional Comments:  Golda AcreDavis, Rhonda Lynn, RN 08/03/2016, 10:33 AM

## 2016-08-03 NOTE — Progress Notes (Signed)
Pt left the unit, disconnected himself from infusing fluids. Security called, [facility]. RN noted Pt out of unit at 2010. Pt returned accompanied by two family members at 2032. Will notify MD and continue to monitor.

## 2016-08-04 ENCOUNTER — Encounter (HOSPITAL_COMMUNITY): Payer: Self-pay | Admitting: Internal Medicine

## 2016-08-04 DIAGNOSIS — R768 Other specified abnormal immunological findings in serum: Secondary | ICD-10-CM

## 2016-08-04 LAB — CBC
HCT: 42.1 % (ref 39.0–52.0)
Hemoglobin: 14.1 g/dL (ref 13.0–17.0)
MCH: 28 pg (ref 26.0–34.0)
MCHC: 33.5 g/dL (ref 30.0–36.0)
MCV: 83.5 fL (ref 78.0–100.0)
PLATELETS: 182 10*3/uL (ref 150–400)
RBC: 5.04 MIL/uL (ref 4.22–5.81)
RDW: 12.7 % (ref 11.5–15.5)
WBC: 5.5 10*3/uL (ref 4.0–10.5)

## 2016-08-04 LAB — COMPREHENSIVE METABOLIC PANEL
ALT: 60 U/L (ref 17–63)
AST: 33 U/L (ref 15–41)
Albumin: 3.5 g/dL (ref 3.5–5.0)
Alkaline Phosphatase: 78 U/L (ref 38–126)
Anion gap: 6 (ref 5–15)
BUN: <5 mg/dL — ABNORMAL LOW (ref 6–20)
CHLORIDE: 99 mmol/L — AB (ref 101–111)
CO2: 26 mmol/L (ref 22–32)
CREATININE: 1.04 mg/dL (ref 0.61–1.24)
Calcium: 8.3 mg/dL — ABNORMAL LOW (ref 8.9–10.3)
Glucose, Bld: 79 mg/dL (ref 65–99)
Potassium: 3.8 mmol/L (ref 3.5–5.1)
Sodium: 131 mmol/L — ABNORMAL LOW (ref 135–145)
TOTAL PROTEIN: 6.4 g/dL — AB (ref 6.5–8.1)
Total Bilirubin: 0.6 mg/dL (ref 0.3–1.2)

## 2016-08-04 LAB — HEPATITIS C ANTIBODY

## 2016-08-04 LAB — HEPATITIS B SURFACE ANTIGEN: HEP B S AG: NEGATIVE

## 2016-08-04 LAB — HEPATITIS B CORE ANTIBODY, TOTAL: Hep B Core Total Ab: NEGATIVE

## 2016-08-04 LAB — HEPATITIS B SURFACE ANTIBODY,QUALITATIVE: HEP B S AB: NONREACTIVE

## 2016-08-04 MED ORDER — SODIUM CHLORIDE 0.9 % IV SOLN
INTRAVENOUS | Status: DC
  Administered 2016-08-04: 17:00:00 via INTRAVENOUS

## 2016-08-04 MED ORDER — TRAMADOL HCL 50 MG PO TABS
50.0000 mg | ORAL_TABLET | Freq: Four times a day (QID) | ORAL | Status: DC | PRN
  Administered 2016-08-04 – 2016-08-05 (×3): 50 mg via ORAL
  Filled 2016-08-04 (×3): qty 1

## 2016-08-04 MED ORDER — METOCLOPRAMIDE HCL 5 MG/ML IJ SOLN
10.0000 mg | Freq: Three times a day (TID) | INTRAMUSCULAR | Status: DC
  Administered 2016-08-04 – 2016-08-05 (×4): 10 mg via INTRAVENOUS
  Filled 2016-08-04 (×4): qty 2

## 2016-08-04 NOTE — Progress Notes (Signed)
Reviewed at hospital No letter

## 2016-08-04 NOTE — Progress Notes (Addendum)
PROGRESS NOTE  Chris Randall:[id number] DOB: 1976-04-19 DOA: 08/02/2016 PCP: No PCP Per Patient   LOS: 1 day   Brief Narrative: 41 year old male, single, lives with family, independent of activities of daily living, works in the tree cutting business, PMH of controlled asthma, reported esophageal varices, GERD and GI bleed, first diagnosed at age 57 by EGD and claims to have quit alcohol abuse since, tobacco abuse, presented to the [facility] ED on 08/02/16 with complaints of abdominal pain and hematemesis. GI consulted.  Assessment & Plan: Principal Problem:   Hematemesis/vomiting blood Active Problems:   Abdominal pain   Tobacco abuse   Asthma   Esophageal varices determined by endoscopy (HCC)   Abnormal transaminases   Erosive esophagitis   Erosive gastritis with hemorrhage   Acute abdominal pain with vomiting and an episode of hematemesis  -Gastroenterology was consulted, patient underwent an EGD on 3/12 which showed severe acute gastritis with hemorrhage, continue PPI infusion per GI -Continue with IV PPI.  -Vomited this morning. Still complaining of abdominal pain.  -continue with clear diet.  -start tramadol  -started on reglan.   Hyponatremia; IV fluids.   GERD -Tx as above.  Tobacco abuse -Cessation counseled. Nicotine patch ordered.   Asthma -Stable without clinical bronchospasm. When necessary albuterol nebulizations.  History of esophageal varices:  -Not a known cirrhotic, no EDV is noted on the EGD  Hemoglobin urea in the absence of hematuria: Unclear etiology. Outpatient follow-up. No urinary symptoms reported.  Sore throat  - viral vs post procedure. Monitor. Chlorasept  DVT prophylaxis: SCD Code Status: Full code Family Communication: no family bedside Disposition Plan: home 1 day   Consultants:   GI  Procedures:   EGD 3/12 Impression:               - LA Grade B reflux esophagitis. Biopsied. Probably       has underlying Barrett's and would rescope at some                            point after suspect he is healed                           - Red blood in the stomach.                           - Acute gastritis with hemorrhage. Biopsied. cause                            of bleed not esophagitis                           - 4 cm hiatal hernia.                           - The examination was otherwise normal. Moderate Sedation:      N/A- Per Anesthesia Care Recommendation:           - Return patient to hospital ward for ongoing care.                           - Clear liquid diet.                           -  in AM go to bid PPI  Antimicrobials:  None    Subjective: Still with significant abdominal pain. Episode of vomit this morning.   Objective: Vitals:   08/03/16 1410 08/03/16 2027 08/04/16 0508 08/04/16 1456  BP: 124/83 117/81 124/61 119/79  Pulse: 61 71 82 67  Resp: 18 18 18 20   Temp: 98.2 F (36.8 C) 98.3 F (36.8 C) 98.5 F (36.9 C) 98 F (36.7 C)  TempSrc: Oral Oral Oral Oral  SpO2: 99% 97% 99% 97%  Weight:      Height:        Intake/Output Summary (Last 24 hours) at 08/04/16 1531 Last data filed at 08/04/16 0600  Gross per 24 hour  Intake              520 ml  Output                1 ml  Net              519 ml   Filed Weights   08/02/16 0252 08/02/16 1103  Weight: 88.5 kg (195 lb) 88.5 kg (195 lb)    Examination: Constitutional: NAD Vitals:   08/03/16 1410 08/03/16 2027 08/04/16 0508 08/04/16 1456  BP: 124/83 117/81 124/61 119/79  Pulse: 61 71 82 67  Resp: 18 18 18 20   Temp: 98.2 F (36.8 C) 98.3 F (36.8 C) 98.5 F (36.9 C) 98 F (36.7 C)  TempSrc: Oral Oral Oral Oral  SpO2: 99% 97% 99% 97%  Weight:      Height:       Eyes: PERRL, lids and conjunctivae normal ENMT: Mucous membranes are moist. No oropharyngeal exudates, but has erythematous oropharynx  Respiratory: clear to auscultation bilaterally, no wheezing, no crackles. Normal respiratory  effort. No accessory muscle use.  Cardiovascular: Regular rate and rhythm, no murmurs / rubs / gallops. No LE edema. 2+ pedal pulses.  Abdomen: mild tenderness throughout. Bowel sounds positive.  Musculoskeletal: no clubbing / cyanosis.   Neurologic: non focal    Data Reviewed: I have personally reviewed following labs and imaging studies  CBC:  Recent Labs Lab 08/02/16 0315 08/02/16 1852 08/03/16 0334 08/04/16 0616  WBC 6.9 6.9 6.0 5.5  HGB 14.8 13.0 13.4 14.1  HCT 45.6 39.0 40.0 42.1  MCV 84.0 83.7 84.7 83.5  PLT 233 176 188 182   Basic Metabolic Panel:  Recent Labs Lab 08/02/16 0315 08/04/16 0616  NA 136 131*  K 3.6 3.8  CL 104 99*  CO2 24 26  GLUCOSE 123* 79  BUN 11 <5*  CREATININE 1.10 1.04  CALCIUM 8.6* 8.3*   GFR: Estimated Creatinine Clearance: 95.6 mL/min (by C-G formula based on SCr of 1.04 mg/dL). Liver Function Tests:  Recent Labs Lab 08/02/16 0315 08/04/16 0616  AST 56* 33  ALT 100* 60  ALKPHOS 92 78  BILITOT 0.7 0.6  PROT 7.1 6.4*  ALBUMIN 4.1 3.5    Recent Labs Lab 08/02/16 0315  LIPASE 21   No results for input(s): AMMONIA in the last 168 hours. Coagulation Profile:  Recent Labs Lab 08/02/16 0315  INR 1.02   Cardiac Enzymes: No results for input(s): CKTOTAL, CKMB, CKMBINDEX, TROPONINI in the last 168 hours. BNP (last 3 results) No results for input(s): PROBNP in the last 8760 hours. HbA1C: No results for input(s): HGBA1C in the last 72 hours. CBG: No results for input(s): GLUCAP in the last 168 hours. Lipid Profile: No results for input(s): CHOL, HDL, LDLCALC, TRIG, CHOLHDL, LDLDIRECT in  the last 72 hours. Thyroid Function Tests: No results for input(s): TSH, T4TOTAL, FREET4, T3FREE, THYROIDAB in the last 72 hours. Anemia Panel: No results for input(s): VITAMINB12, FOLATE, FERRITIN, TIBC, IRON, RETICCTPCT in the last 72 hours. Urine analysis:    Component Value Date/Time   COLORURINE RED (A) 08/02/2016 0750    APPEARANCEUR HAZY (A) 08/02/2016 0750   LABSPEC 1.012 08/02/2016 0750   PHURINE 6.0 08/02/2016 0750   GLUCOSEU NEGATIVE 08/02/2016 0750   HGBUR LARGE (A) 08/02/2016 0750   BILIRUBINUR NEGATIVE 08/02/2016 0750   KETONESUR NEGATIVE 08/02/2016 0750   PROTEINUR 30 (A) 08/02/2016 0750   UROBILINOGEN 0.2 11/28/2014 0009   NITRITE NEGATIVE 08/02/2016 0750   LEUKOCYTESUR NEGATIVE 08/02/2016 0750   Sepsis Labs: Invalid input(s): PROCALCITONIN, LACTICIDVEN  No results found for this or any previous visit (from the past 240 hour(s)).    Radiology Studies: No results found.   Scheduled Meds: . dicyclomine  20 mg Oral TID AC  . metoCLOPramide (REGLAN) injection  10 mg Intravenous TID AC & HS  . nicotine  14 mg Transdermal Daily  . [START ON 08/05/2016] pantoprazole  40 mg Intravenous Q12H   Continuous Infusions: . sodium chloride    . pantoprozole (PROTONIX) infusion 8 mg/hr (08/04/16 0212)     Pansy Ostrovsky, md.  [facility] Pager [phone]  If 7PM-7AM, please contact night-coverage [email or web address] Password [email or web address] 08/04/2016, 3:31 PM

## 2016-08-04 NOTE — Progress Notes (Signed)
Progress Note   Subjective  Chief Complaint:epigastric abdominal pain  Patient tells me he continues with abdominal pain which was so severe over the night that he could not sleep. He tells me that when he would receive his Dilaudid he would fall sleep for a few hours but then wake back up in pain. He continues with hoarseness of his voice today and tells me that he tried to eat clears this morning and vomited immediately after they hit his stomach. This also initiated further epigastric abdominal cramping. He has been trying the dicyclomine which does not seem to help. Patient does tell me he did leave the floor yesterday to go see his nephew before he was having a procedure done. He was unaware that he was unable to leave. He Programmer, applications. He did have a BM yesterday.   Objective   Vital signs in last 24 hours: Temp:  [98.2 F (36.8 C)-98.5 F (36.9 C)] 98.5 F (36.9 C) (03/14 0508) Pulse Rate:  [61-82] 82 (03/14 0508) Resp:  [18] 18 (03/14 0508) BP: (117-124)/(61-83) 124/61 (03/14 0508) SpO2:  [97 %-99 %] 99 % (03/14 0508) Last BM Date: 08/02/16 General: Caucasian male in NAD Heart:  Regular rate and rhythm; no murmurs Lungs: Respirations even and unlabored, lungs CTA bilaterally Abdomen:  Soft, mod ttp in epigastrum and nondistended. Normal bowel sounds. Extremities:  Without edema. Neurologic:  Alert and oriented,  grossly normal neurologically. Psych:  Cooperative. Normal mood and affect.  Intake/Output from previous day: 03/13 0701 - 03/14 0700 In: 970 [P.O.:570; I.V.:400] Out: 3 [Urine:3]  Lab Results:  Recent Labs  08/02/16 1852 08/03/16 0334 08/04/16 0616  WBC 6.9 6.0 5.5  HGB 13.0 13.4 14.1  HCT 39.0 40.0 42.1  PLT 176 188 182   BMET  Recent Labs  08/02/16 0315 08/04/16 0616  NA 136 131*  K 3.6 3.8  CL 104 99*  CO2 24 26  GLUCOSE 123* 79  BUN 11 <5*  CREATININE 1.10 1.04  CALCIUM 8.6* 8.3*   LFT  Recent Labs  08/04/16 0616  PROT 6.4*    ALBUMIN 3.5  AST 33  ALT 60  ALKPHOS 78  BILITOT 0.6      Assessment / Plan:   Assessment: 1. Hematemesis: no further since time of admission, EGD showing erosive esophagitis and acute gastritis on 08/02/16 2. Epigastric abdominal pain:continues per patient, unable to eat without vomiting and epigastric cramping, dicyclomine is of no help per patient 3. Elevated LFTs: normalized today; patient is positive for HCV Ab  Plan: 1. Lab results show patient is positive for hep C, will need to discuss further with him 2. Patient continues with epigastric abdominal pain, continue dicyclomine for now 3. Please await any further recommendations from Dr. Leone Payor  Thank you for your kind consultation, we'll continue to follow   LOS: 1 day   Unk Lightning  08/04/2016, 9:38 AM  Pager # [phone]     [facility] Attending   I have taken an interval history, reviewed the chart and examined the patient. I agree with the Advanced Practitioner's note, impression and recommendations.   Better but still ill, vomiting. I added IV Reglan  Bxs showed ulceration of gastric cardia and gastropathy  Will check HCV viral load today, genotype  Maybe he had a viral illness on top of GERD and is taking a while to improve He was up in room moving furniture closer to bed when I arrived  Hopefully better and home tomorrow  but 1 d at a time - lack of insurance a barrier to care   Iva Booparl E. Gessner, MD, [facility] [phone] (pager) [phone] after 5 PM, weekends and holidays  08/04/2016 12:12 PM

## 2016-08-05 LAB — BASIC METABOLIC PANEL
ANION GAP: 4 — AB (ref 5–15)
BUN: 6 mg/dL (ref 6–20)
CALCIUM: 8.8 mg/dL — AB (ref 8.9–10.3)
CO2: 28 mmol/L (ref 22–32)
CREATININE: 0.97 mg/dL (ref 0.61–1.24)
Chloride: 105 mmol/L (ref 101–111)
GFR calc non Af Amer: >60 mL/min (ref >60)
Glucose, Bld: 114 mg/dL — ABNORMAL HIGH (ref 65–99)
Potassium: 4.3 mmol/L (ref 3.5–5.1)
SODIUM: 137 mmol/L (ref 135–145)

## 2016-08-05 MED ORDER — METOCLOPRAMIDE HCL 10 MG PO TABS: 10.0000 mg | ORAL_TABLET | Freq: Three times a day (TID) | ORAL | 0 refills | Status: DC

## 2016-08-05 MED ORDER — PANTOPRAZOLE SODIUM 40 MG PO TBEC: 40.0000 mg | DELAYED_RELEASE_TABLET | Freq: Two times a day (BID) | ORAL | 0 refills | Status: DC

## 2016-08-05 MED ORDER — DICYCLOMINE HCL 20 MG PO TABS: 20.0000 mg | ORAL_TABLET | Freq: Three times a day (TID) | ORAL | 0 refills | Status: DC

## 2016-08-05 MED ORDER — HYDROCODONE-ACETAMINOPHEN 5-325 MG PO TABS
1.0000 | ORAL_TABLET | Freq: Four times a day (QID) | ORAL | 0 refills | Status: DC | PRN
Start: 2016-08-05 — End: 2016-12-26

## 2016-08-05 NOTE — Discharge Summary (Signed)
Physician Discharge Summary  Chris Randall ZOX:[id number] DOB: 16-Feb-1976 DOA: 08/02/2016  PCP: No PCP Per Patient  Admit date: 08/02/2016 Discharge date: 08/05/2016  Admitted From: Home  Disposition: Home   Recommendations for Outpatient Follow-up:  1. Follow up with PCP in 1-2 weeks 2. Please obtain BMP/CBC in one week 3. Please follow up on the following pending results: HCV RNA   Discharge Condition: Stable.  CODE STATUS: Full code.  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 GI consulted.  Assessment & Plan: Principal Problem:   Hematemesis/vomiting blood Active Problems:   Abdominal pain   Tobacco abuse   Asthma   Esophageal varices determined by endoscopy (HCC)   Abnormal transaminases   Erosive esophagitis   Erosive gastritis with hemorrhage   Acute abdominal pain with vomiting and an episode of hematemesis -Gastroenterology was consulted, patient underwent an EGD on 3/12 which showed severe acute gastritis with hemorrhage, continue PPI infusion per GI -Continue with IV PPI.  -tolerating full liquid diet. Tramadol doesn't help. Will provide few days of Vicodin. Reviewed [place] database last prescriptions for opiod was 2017.  -started on reglan. Will provide prescription for reglan. And PPI.   Hyponatremia; IV fluids.   Hepatitis C positive; Hepatitis C RNA pending. Follow up with GI.   GERD -Tx as above.  Tobacco abuse -Cessation counseled. Nicotine patch ordered.   Asthma -Stable without clinical bronchospasm. When necessary albuterol  nebulizations.  History of esophageal varices: -Not a known cirrhotic, no EDV is noted on the EGD  Hemoglobin urea in the absence of hematuria:Unclear etiology. Outpatient follow-up. No urinary symptoms reported.  Sore throat  - viral vs post procedure. Monitor. Chlorasept  Discharge Diagnoses:  Principal Problem:   Hematemesis/vomiting blood Active Problems:   Abdominal pain   Tobacco abuse   Asthma   Esophageal varices determined by endoscopy (HCC)   Abnormal transaminases   Erosive esophagitis   Erosive gastritis with hemorrhage    Discharge Instructions  Discharge Instructions    Diet - low sodium heart healthy    Complete by:  As directed    Increase activity slowly    Complete by:  As directed      Allergies as of 08/05/2016      Reactions   Ibuprofen Other (See Comments)   Esophageal ulcers aggravates them   Vicodin [hydrocodone-acetaminophen] Other (See Comments)   Esophageal ulcers aggravates them.       Medication List    TAKE these medications   albuterol 108 (90 Base) MCG/ACT inhaler Commonly known as:  PROVENTIL HFA;VENTOLIN HFA Inhale 1-2 puffs into the lungs every 6 (six) hours as needed for wheezing or shortness of breath.   dicyclomine 20 MG tablet Commonly known as:  BENTYL Take 1 tablet (20 mg total) by mouth 3 (three) times daily before meals.   HYDROcodone-acetaminophen 5-325 MG tablet Commonly known as:  NORCO/VICODIN Take 1 tablet by mouth every 6 (six) hours as needed for moderate pain.   metoCLOPramide 10 MG tablet Commonly known as:  REGLAN Take 1 tablet (10 mg total) by mouth 3 (three) times daily before meals.   pantoprazole 40 MG tablet  Commonly known as:  PROTONIX Take 1 tablet (40 mg total) by mouth 2 (two) times daily.       Allergies  Allergen Reactions  . Ibuprofen Other (See Comments)    Esophageal ulcers aggravates them  . Vicodin [Hydrocodone-Acetaminophen] Other (See Comments)    Esophageal ulcers  aggravates them.     Consultations:  Dr Leone Payor.    Procedures/Studies: Ct Abdomen Pelvis W Contrast  Result Date: 08/02/2016 CLINICAL DATA:  Abdominal pain with nausea and vomiting EXAM: CT ABDOMEN AND PELVIS WITH CONTRAST TECHNIQUE: Multidetector CT imaging of the abdomen and pelvis was performed using the standard protocol following bolus administration of intravenous contrast. CONTRAST:  100 mL Isovue 370 nonionic COMPARISON:  April 16, 2015 FINDINGS: Lower chest: Lung bases are clear. There is a distal hiatal hernia. A somewhat serpiginous appearance in this area suggests a degree of underlying distal esophageal varices. Hepatobiliary: There is fatty infiltration near the fissure for the ligamentum teres. There is an 8 mm cyst in the posterior segment of the right lobe of the liver. No other focal liver lesions are evident. Gallbladder wall is not appreciably thickened. There is no biliary duct dilatation. Pancreas: There is no pancreatic mass or inflammatory focus. Spleen: No splenic lesions are evident. There is a small accessory spleen between the spleen and lateral left kidney peer Adrenals/Urinary Tract: Adrenals appear normal bilaterally. Kidneys bilaterally show no demonstrable mass or hydronephrosis on either side. There is no renal or ureteral calculus on either side. Urinary bladder is midline with wall thickness within normal limits. Stomach/Bowel: There are multiple sigmoid diverticula and descending colonic diverticula without diverticulitis. There is no bowel wall or mesenteric thickening. No bowel obstruction or free air. Vascular/Lymphatic: There is no abdominal aortic aneurysm. There are foci of atherosclerotic calcification at the aortic bifurcation and proximal iliac arteries. Major mesenteric vessels appear patent. There is no appreciable adenopathy in the abdomen or pelvis. Reproductive: There are a few small prostatic calculi. Prostate and seminal vesicles are normal in size  and contour. Other: Appendix appears normal. No ascites or abscess evident in the abdomen or pelvis. There is a small ventral hernia containing only fat. Musculoskeletal: There are no blastic or lytic bone lesions. No intramuscular or abdominal wall lesion. IMPRESSION: Distal hiatal hernia with suspected distal esophageal varices. Minimal ventral hernia containing only fat. Multiple distal colonic diverticula without diverticulitis. No bowel obstruction. No abscess. Appendix appears normal. No renal or ureteral calculus. No hydronephrosis. There are occasional prostatic calculi. Focal fatty infiltration in the region of the fissure for the ligamentum teres. Electronically Signed   By: Bretta Bang III M.D.   On: 08/02/2016 07:15   Dg Abdomen Acute W/chest  Result Date: 08/02/2016 CLINICAL DATA:  Severe abdominal pain, nausea and vomiting with acute onset 2 hours ago. History of ulcers. EXAM: DG ABDOMEN ACUTE W/ 1V CHEST COMPARISON:  Chest and pelvis 05/04/2016. CT abdomen and pelvis 04/16/2015 FINDINGS: Shallow inspiration. Normal heart size and pulmonary vascularity. Suggestion of infiltration in the right lung base, possibly pneumonia. No blunting of costophrenic angles. No pneumothorax. Mediastinal contours appear intact. Scattered gas and stool throughout the colon. No small or large bowel distention. No free intra-abdominal air. No abnormal air-fluid levels. No radiopaque stones. Visualized bones appear intact. IMPRESSION: Suggestion of infiltration in the right lung base, possibly pneumonia. Normal nonobstructive bowel gas pattern. Electronically Signed   By: Burman Nieves M.D.   On: 08/02/2016 04:27       Subjective: Tolerating current diet. Still  with abdominal pain but better \\tramadol  doesn't helps./   Discharge Exam: Vitals:   08/05/16 0439 08/05/16 1300  BP: (!) 119/59 113/71  Pulse: (!) 53 (!) 58  Resp: 18 18  Temp: 97.9 F (36.6 C) 97.8 F (36.6 C)   Vitals:   08/04/16  1456 08/04/16 2100 08/05/16 0439 08/05/16 1300  BP: 119/79 120/74 (!) 119/59 113/71  Pulse: 67 (!) 57 (!) 53 (!) 58  Resp: 20 16 18 18   Temp: 98 F (36.7 C) 98.2 F (36.8 C) 97.9 F (36.6 C) 97.8 F (36.6 C)  TempSrc: Oral Oral Oral Oral  SpO2: 97% 100% 99% 98%  Weight:      Height:        General: Pt is alert, awake, not in acute distress Cardiovascular: RRR, S1/S2 +, no rubs, no gallops Respiratory: CTA bilaterally, no wheezing, no rhonchi Abdominal: Soft, NT, ND, bowel sounds + Extremities: no edema, no cyanosis    The results of significant diagnostics from this hospitalization (including imaging, microbiology, ancillary and laboratory) are listed below for reference.     Microbiology: No results found for this or any previous visit (from the past 240 hour(s)).   Labs: BNP (last 3 results) No results for input(s): BNP in the last 8760 hours. Basic Metabolic Panel:  Recent Labs Lab 08/02/16 0315 08/04/16 0616 08/05/16 0606  NA 136 131* 137  K 3.6 3.8 4.3  CL 104 99* 105  CO2 24 26 28   GLUCOSE 123* 79 114*  BUN 11 <5* 6  CREATININE 1.10 1.04 0.97  CALCIUM 8.6* 8.3* 8.8*   Liver Function Tests:  Recent Labs Lab 08/02/16 0315 08/04/16 0616  AST 56* 33  ALT 100* 60  ALKPHOS 92 78  BILITOT 0.7 0.6  PROT 7.1 6.4*  ALBUMIN 4.1 3.5    Recent Labs Lab 08/02/16 0315  LIPASE 21   No results for input(s): AMMONIA in the last 168 hours. CBC:  Recent Labs Lab 08/02/16 0315 08/02/16 1852 08/03/16 0334 08/04/16 0616  WBC 6.9 6.9 6.0 5.5  HGB 14.8 13.0 13.4 14.1  HCT 45.6 39.0 40.0 42.1  MCV 84.0 83.7 84.7 83.5  PLT 233 176 188 182   Cardiac Enzymes: No results for input(s): CKTOTAL, CKMB, CKMBINDEX, TROPONINI in the last 168 hours. BNP: Invalid input(s): POCBNP CBG: No results for input(s): GLUCAP in the last 168 hours. D-Dimer No results for input(s): DDIMER in the last 72 hours. Hgb A1c No results for input(s): HGBA1C in the last 72  hours. Lipid Profile No results for input(s): CHOL, HDL, LDLCALC, TRIG, CHOLHDL, LDLDIRECT in the last 72 hours. Thyroid function studies No results for input(s): TSH, T4TOTAL, T3FREE, THYROIDAB in the last 72 hours.  Invalid input(s): FREET3 Anemia work up No results for input(s): VITAMINB12, FOLATE, FERRITIN, TIBC, IRON, RETICCTPCT in the last 72 hours. Urinalysis    Component Value Date/Time   COLORURINE RED (A) 08/02/2016 0750   APPEARANCEUR HAZY (A) 08/02/2016 0750   LABSPEC 1.012 08/02/2016 0750   PHURINE 6.0 08/02/2016 0750   GLUCOSEU NEGATIVE 08/02/2016 0750   HGBUR LARGE (A) 08/02/2016 0750   BILIRUBINUR NEGATIVE 08/02/2016 0750   KETONESUR NEGATIVE 08/02/2016 0750   PROTEINUR 30 (A) 08/02/2016 0750   UROBILINOGEN 0.2 11/28/2014 0009   NITRITE NEGATIVE 08/02/2016 0750   LEUKOCYTESUR NEGATIVE 08/02/2016 0750   Sepsis Labs Invalid input(s): PROCALCITONIN,  WBC,  LACTICIDVEN Microbiology No results found for this or any previous visit (from the past 240 hour(s)).   Time coordinating discharge:  Over 30 minutes  SIGNED:   Alba Coryegalado, Augie Vane A, MD  [facility] 08/05/2016, 1:33 PM Pager [phone]  If 7PM-7AM, please contact night-coverage [email or web address] Password [email or web address]

## 2016-08-05 NOTE — Progress Notes (Signed)
Date: August 05, 2016 Discharge orders checked for needs. No case management needs present at time of discharge. Rhonda Davis, RN, BSN, CCM   [phone] 

## 2016-08-05 NOTE — Progress Notes (Signed)
    Progress Note   Subjective  Chief Complaint:Epigastric abdominal pain  Today, the patient is found asleep in bed. He easily wakes and tells me he was able to eat soft foods for lunch and dinner last night and able to keep them down. He tells me that Reglan is helping. He does continue with an epigastric cramping pain which is "still horrible", but patient does admit this is somewhat improved since time of admission. He denies any vomiting.   Objective   Vital signs in last 24 hours: Temp:  [97.9 F (36.6 C)-98.2 F (36.8 C)] 97.9 F (36.6 C) (03/15 0439) Pulse Rate:  [53-67] 53 (03/15 0439) Resp:  [16-20] 18 (03/15 0439) BP: (119-120)/(59-79) 119/59 (03/15 0439) SpO2:  [97 %-100 %] 99 % (03/15 0439) Last BM Date: 08/03/16 General: Caucasian male in NAD Heart:  Regular rate and rhythm; no murmurs Lungs: Respirations even and unlabored, lungs CTA bilaterally Abdomen:  Soft, mild epigastric ttp with moderate wincing at time of palpation and nondistended. Normal bowel sounds. Extremities:  Without edema. Neurologic:  Alert and oriented,  grossly normal neurologically. Psych:  Cooperative. Normal mood and affect.  Intake/Output from previous day: 03/14 0701 - 03/15 0700 In: 2403.3 [P.O.:840; I.V.:1563.3] Out: -  Intake/Output this shift: Total I/O In: 720 [P.O.:720] Out: -   Lab Results:  Recent Labs  08/02/16 1852 08/03/16 0334 08/04/16 0616  WBC 6.9 6.0 5.5  HGB 13.0 13.4 14.1  HCT 39.0 40.0 42.1  PLT 176 188 182   BMET  Recent Labs  08/04/16 0616 08/05/16 0606  NA 131* 137  K 3.8 4.3  CL 99* 105  CO2 26 28  GLUCOSE 79 114*  BUN <5* 6  CREATININE 1.04 0.97  CALCIUM 8.3* 8.8*   LFT  Recent Labs  08/04/16 0616  PROT 6.4*  ALBUMIN 3.5  AST 33  ALT 60  ALKPHOS 78  BILITOT 0.6     Assessment / Plan:   Assessment: 1. Hematemesis: No further since time of admission, EGD showing erosive esophagitis and acute gastritis on 08/02/16, patient able to  keep down two soft meals yesterday with assistance from Reglan 2. Epigastric abdominal pain: Continues, but better since time of admission per patient 3. Elevated LFTs: Continue to remain normal today; patient is positive for hep C, HCV RNA pending  Plan: 1. Continue Reglan 2. Continue diet: 3. Patient could likely be discharged home today  4. Please await any further recommendations from Dr. Leone PayorGessner  Thank you for kind consultation.   LOS: 2 days   Unk LightningJennifer Lynne Lemmon  08/05/2016, 10:59 AM  Pager # [phone]    [facility] Attending   I have taken an interval history, reviewed the chart and examined the patient. I agree with the Advanced Practitioner's note, impression and recommendations.   He is clearly better and medically ready for dc Suggest PPI chronically Short term metaclopramide and may be reasonable to provide a limited short-term supply of Vicodin.  He can call my office for f/u appt.  Iva Booparl E. Florencio Hollibaugh, MD, [facility] [phone] (pager) [phone] after 5 PM, weekends and holidays  08/05/2016 1:43 PM

## 2016-08-09 LAB — HEPATITIS C GENOTYPE

## 2016-08-09 LAB — HCV RNA QUANT RFLX ULTRA OR GENOTYP
HCV RNA QNT(LOG COPY/ML): 6.702 {Log_IU}/mL
HEPATITIS C QUANTITATION: 5040000 [IU]/mL

## 2016-08-16 ENCOUNTER — Encounter: Payer: Self-pay | Admitting: Internal Medicine

## 2016-08-16 DIAGNOSIS — B1920 Unspecified viral hepatitis C without hepatic coma: Secondary | ICD-10-CM | POA: Insufficient documentation

## 2016-08-16 NOTE — Progress Notes (Signed)
Please let him know that this testing confirms he has hepatitis C. He should make a follow-up visit to review this and his other gastrointestinal issues

## 2016-12-02 NOTE — Anesthesia Postprocedure Evaluation (Signed)
Anesthesia Post Note  Patient: Chris MyrtleStephen R Carmon  Procedure(s) Performed: Procedure(s) (LRB): ESOPHAGOGASTRODUODENOSCOPY (EGD) WITH PROPOFOL (N/A)     Anesthesia Post Evaluation  Last Vitals:  Vitals:   08/05/16 0439 08/05/16 1300  BP: (!) 119/59 113/71  Pulse: (!) 53 (!) 58  Resp: 18 18  Temp: 36.6 C 36.6 C    Last Pain:  Vitals:   08/05/16 1514  TempSrc:   PainSc: 8                  Lowella CurbWarren Ray Raylynne Cubbage

## 2016-12-02 NOTE — Addendum Note (Signed)
Addendum  created 12/02/16 1638 by Cray Monnin Ray, MD   Sign clinical note    

## 2016-12-08 ENCOUNTER — Encounter (HOSPITAL_COMMUNITY): Payer: Self-pay | Admitting: Emergency Medicine

## 2016-12-08 ENCOUNTER — Emergency Department (HOSPITAL_COMMUNITY): Payer: Self-pay

## 2016-12-08 ENCOUNTER — Emergency Department (HOSPITAL_COMMUNITY)
Admission: EM | Admit: 2016-12-08 | Discharge: 2016-12-08 | Disposition: A | Discharge status: Home / Self Care | Payer: Self-pay | Attending: Emergency Medicine | Admitting: Emergency Medicine

## 2016-12-08 DIAGNOSIS — W208XXA Other cause of strike by thrown, projected or falling object, initial encounter: Secondary | ICD-10-CM | POA: Insufficient documentation

## 2016-12-08 DIAGNOSIS — Y93H9 Activity, other involving exterior property and land maintenance, building and construction: Secondary | ICD-10-CM | POA: Insufficient documentation

## 2016-12-08 DIAGNOSIS — Y998 Other external cause status: Secondary | ICD-10-CM | POA: Insufficient documentation

## 2016-12-08 DIAGNOSIS — Y92009 Unspecified place in unspecified non-institutional (private) residence as the place of occurrence of the external cause: Secondary | ICD-10-CM | POA: Insufficient documentation

## 2016-12-08 DIAGNOSIS — F17210 Nicotine dependence, cigarettes, uncomplicated: Secondary | ICD-10-CM | POA: Insufficient documentation

## 2016-12-08 DIAGNOSIS — M542 Cervicalgia: Secondary | ICD-10-CM

## 2016-12-08 DIAGNOSIS — J45909 Unspecified asthma, uncomplicated: Secondary | ICD-10-CM | POA: Insufficient documentation

## 2016-12-08 DIAGNOSIS — Z79899 Other long term (current) drug therapy: Secondary | ICD-10-CM | POA: Insufficient documentation

## 2016-12-08 DIAGNOSIS — S1093XA Contusion of unspecified part of neck, initial encounter: Secondary | ICD-10-CM | POA: Insufficient documentation

## 2016-12-08 MED ORDER — KETOROLAC TROMETHAMINE 30 MG/ML IJ SOLN
30.0000 mg | Freq: Once | INTRAMUSCULAR | Status: AC
  Administered 2016-12-08: 30 mg via INTRAMUSCULAR
  Filled 2016-12-08: qty 1

## 2016-12-08 MED ORDER — OXYCODONE-ACETAMINOPHEN 5-325 MG PO TABS
1.0000 | ORAL_TABLET | Freq: Once | ORAL | Status: AC
Start: 2016-12-08 — End: 2016-12-08
  Administered 2016-12-08: 1 via ORAL
  Filled 2016-12-08: qty 1

## 2016-12-08 NOTE — ED Notes (Signed)
Patient transported to MRI 

## 2016-12-08 NOTE — ED Notes (Signed)
Pt reports he was grabbing a 40 foot metal ladder when the top got caught in the tree as he was walking away when the ladder fell on his neck.  Pt reports pain in his neck radiates outward to his shoulders bilaterally causing tingling and numbness in his hands and arms.  Pt reports hx of "pinched nerve" in his neck.

## 2016-12-08 NOTE — ED Notes (Signed)
Pt from home with c/o neck and back pain from an injury around noon today where pt was carrying a 40 ft aluminum ladder. Pt states the ladder became unbalanced and struck him on the back of the neck. Pt states he has 10/10 pain across his neck and he has a tingling sensation in both arms

## 2016-12-08 NOTE — Discharge Instructions (Signed)
Tylenol for pain. You can try lidocaine patches. Ice pack on and off several times a day. Follow up with family doctor if pain continues

## 2016-12-08 NOTE — ED Notes (Signed)
Pt is crying d/t pain. But was asking for coffee prior.  Coffee given to pt.

## 2016-12-08 NOTE — ED Notes (Signed)
Pt states he needs IV fluids because he has been outside working and might be dehydrated.  Lemont Fillersatyana EDPA made aware

## 2016-12-08 NOTE — ED Provider Notes (Signed)
[facility]-EMERGENCY DEPT Provider Note   CSN: [id number] Arrival date & time: 12/08/16  1517  By signing my name below, I, Chris Randall, attest that this documentation has been prepared under the direction and in the presence of Chris Buckwalter, PA-C. Electronically Signed: Cynda AcresHailei Randall, Scribe. 12/08/16. 4:30 PM.  History   Chief Complaint Chief Complaint  Patient presents with  . Neck Pain   HPI Comments: Chris Randall is a 41 y.o. male with no pertinent past medical history, who presents to the Emergency Department complaining of sudden-onset, constant neck pain s/p injury that occurred earlier today at 1 pm. Patient states he was painting his house, when a 40 foot alluminum ladder fell apart and landed on his neck. Patient reports an associated burning/tingling sensation to his bilateral arms, bilateral arm pain, back pain, and bilateral arm weakness. States he has hx of neck problems and has had pain and tingling in the past, but it is worse now.  No medications taken prior to arrival. Patient reports applying warm compresses and icy hot with no relief. Patient is ambulatory in the emergency department. Patient denies any fever, chills, urinary retention, or loss of bowel/bladder control.   The history is provided by the patient. No language interpreter was used.    Past Medical History:  Diagnosis Date  . Asthma   . Bilateral varicoceles 03/2015   also left hydrocele per ultrasound.   . Bronchitis   . Esophageal varices (HCC) 2003 vs 2008   pt reprted varices on EGD in [place] "10 to 15 years ago. No varices or portal gastropathy on EGD 08/02/16  . Hematemesis 08/02/2016   Hx GERD, findings on 08/02/16 EGD: esophagitis LA grade B, ?underlying Barrett's esphagus; 4 cm HH; hemorrhagic gastritis and red blood in stomach (source or bleeding); NO VARICES OR PORTALGASTROPATHY.   . Hepatitis C     Patient Active Problem List   Diagnosis Date Noted  . Hepatitis C   .  Hematemesis/vomiting blood 08/02/2016  . Abdominal pain 08/02/2016  . Tobacco abuse 08/02/2016  . Asthma 08/02/2016  . Esophageal varices determined by endoscopy (HCC) 08/02/2016  . Abnormal transaminases   . Erosive esophagitis   . Erosive gastritis with hemorrhage     Past Surgical History:  Procedure Laterality Date  . ESOPHAGOGASTRODUODENOSCOPY (EGD) WITH PROPOFOL N/A 08/02/2016   Procedure: ESOPHAGOGASTRODUODENOSCOPY (EGD) WITH PROPOFOL;  Surgeon: Iva Booparl E Gessner, MD;  Location: [facility];  Service: Endoscopy;  Laterality: N/A;  . ROTATOR CUFF REPAIR  1995  . SHOULDER SURGERY         Home Medications    Prior to Admission medications   Medication Sig Start Date End Date Taking? Authorizing Provider  albuterol (PROVENTIL HFA;VENTOLIN HFA) 108 (90 Base) MCG/ACT inhaler Inhale 1-2 puffs into the lungs every 6 (six) hours as needed for wheezing or shortness of breath. 05/04/16   Dione BoozeGlick, David, MD  dicyclomine (BENTYL) 20 MG tablet Take 1 tablet (20 mg total) by mouth 3 (three) times daily before meals. 08/05/16   Regalado, Belkys A, MD  HYDROcodone-acetaminophen (NORCO/VICODIN) 5-325 MG tablet Take 1 tablet by mouth every 6 (six) hours as needed for moderate pain. 08/05/16   Regalado, Belkys A, MD  metoCLOPramide (REGLAN) 10 MG tablet Take 1 tablet (10 mg total) by mouth 3 (three) times daily before meals. 08/05/16   Regalado, Belkys A, MD  pantoprazole (PROTONIX) 40 MG tablet Take 1 tablet (40 mg total) by mouth 2 (two) times daily. 08/05/16   Regalado, Countrywide FinancialBelkys  A, MD    Family History Family History  Problem Relation Age of Onset  . Hypertension Other   . Cancer Other     Social History Social History  Substance Use Topics  . Smoking status: Current Every Day Smoker    Packs/day: 0.50    Types: Cigarettes  . Smokeless tobacco: Current User     Comment: 3 cigarettes a day  . Alcohol use Yes     Comment: occasional     Allergies   Ibuprofen and Vicodin  [hydrocodone-acetaminophen]   Review of Systems Review of Systems  Constitutional: Negative for chills and fever.  Genitourinary: Negative for difficulty urinating.  Musculoskeletal: Positive for arthralgias (bilateral arms), back pain and neck pain.  Neurological: Positive for weakness (bilateral arms) and numbness (bilateral arms).  All other systems reviewed and are negative.    Physical Exam Updated Vital Signs BP (!) 141/86 (BP Location: Left Arm)   Pulse 87   Temp (!) 97.5 F (36.4 C) (Oral)   Resp 18   Ht 5\' 4"  (1.626 m)   Wt 185 lb (83.9 kg)   SpO2 98%   BMI 31.76 kg/m   Physical Exam  Constitutional: He is oriented to person, place, and time. He appears well-developed.  HENT:  Head: Normocephalic and atraumatic.  Mouth/Throat: Oropharynx is clear and moist.  Eyes: Pupils are equal, round, and reactive to light. Conjunctivae and EOM are normal.  Neck: Normal range of motion. Neck supple.  No bruising, swelling, deformity of cervical spine. Tenderness to palpation over midline cervical spine. Bilateral perivertebral tenderness  Cardiovascular: Normal rate and regular rhythm.   Pulmonary/Chest: Effort normal and breath sounds normal.  Musculoskeletal: Normal range of motion. He exhibits no edema or deformity.  Full rom of bilateral arms.   Neurological: He is alert and oriented to person, place, and time.  5/5 and equal bilateral grip strength, 5/5 and equal bicep and tricep strength bilaterally. Sensation intact. Pt is able to do "thumbs up", cross 2nd and 3rd fingers, oppose thumb. Distal radial pulses intact and equal bilaterally.   Skin: Skin is warm and dry.  Nursing note and vitals reviewed.    ED Treatments / Results  DIAGNOSTIC STUDIES: Oxygen Saturation is 98% on RA, normal by my interpretation.    COORDINATION OF CARE: 4:30 PM Discussed treatment plan with pt at bedside and pt agreed to plan, which includes a c-collar, pain medication, and an x-ray.     Labs (all labs ordered are listed, but only abnormal results are displayed) Labs Reviewed - No data to display  EKG  EKG Interpretation None       Radiology Mr Cervical Spine Wo Contrast  Result Date: 12/08/2016 CLINICAL DATA:  41 y/o M; neck injury with pain radiating into the shoulders causing tingling and numbness in hands and arms. EXAM: MRI CERVICAL SPINE WITHOUT CONTRAST TECHNIQUE: Multiplanar, multisequence MR imaging of the cervical spine was performed. No intravenous contrast was administered. COMPARISON:  05/04/2016 CT cervical spine.  04/02/2013 cervical MRI. FINDINGS: Alignment: Physiologic. Vertebrae: No fracture, evidence of discitis, or bone lesion. Cord: Normal signal and morphology. Posterior Fossa, vertebral arteries, paraspinal tissues: Negative. Disc levels: C2-3: No significant disc displacement, foraminal narrowing, or canal stenosis. C3-4: Small left central disc protrusion. No significant foraminal or canal stenosis. C4-5: Minimal disc bulge and right-sided uncovertebral hypertrophy with mild right foraminal narrowing. C5-6: Minimal disc bulge and right-sided uncovertebral hypertrophy with mild right foraminal narrowing. C6-7: No significant disc displacement, foraminal narrowing, or  canal stenosis. C7-T1: No significant disc displacement, foraminal narrowing, or canal stenosis. IMPRESSION: 1. Motion degradation of axial sequences. 2. No acute osseous abnormality or abnormal cord signal. 3. Stable mild cervical spondylosis with mild right C4-5 and C5-6 foraminal stenosis. Electronically Signed   By: Mitzi Hansen M.D.   On: 12/08/2016 18:46    Procedures Procedures (including critical care time)  Medications Ordered in ED Medications - No data to display   Initial Impression / Assessment and Plan / ED Course  I have reviewed the triage vital signs and the nursing notes.  Pertinent labs & imaging results that were available during my care of the  patient were reviewed by me and considered in my medical decision making (see chart for details).    Patient with a neck injury, neurovascularly and intact on exam, however complaining of tingling and sharp, shocklike pain going down bilateral arms. Discussed with Dr. Dalene Seltzer, will get MRI to rule out acute injury. Patient does have history of chronic neck and back pain. Percocet ordered for pain.   Patient requesting IV placed, explained to him that we do not think that he needs IV placed at this time. He called out several times stating "I think I need some fluids, and maybe IV pain medicine." I did ordered him Toradol shot IM. MRI results pending.  MRI negative. There is stable mild cervical spondylosis and mild right C4-C5 and C5-C6 foraminal stenosis. No acute abnormalities. Discussed results with patient. When I explained to him that there are no new findings on his MRI to explain his symptoms, he became very angry, started yelling at me stating that he noticed there something broken and he feels grinding and pain in his neck. I did explain to him understand his having pain since he did have an injury, and explained to him that he will need to rest, he can try lidocaine patches, Tylenol for pain. Patient is allergic to ibuprofen and Vicodin. He will need to follow-up with his family doctor if his pain continues.  Vitals:   12/08/16 1525 12/08/16 1539 12/08/16 1916  BP: (!) 141/86  116/82  Pulse: 87  79  Resp: 18  18  Temp: (!) 97.5 F (36.4 C)    TempSrc: Oral    SpO2: 98%  96%  Weight:  83.9 kg (185 lb)   Height:  5\' 4"  (1.626 m)     8:01 PM Pt apparently eloped after me talking to him without informing staff.   Final Clinical Impressions(s) / ED Diagnoses   Final diagnoses:  Neck pain  Contusion of neck, initial encounter    New Prescriptions New Prescriptions   No medications on file   I personally performed the services described in this documentation, which was  scribed in my presence. The recorded information has been reviewed and is accurate.     Jaynie Crumble, PA-C 12/08/16 Francesco Runner, MD 12/09/16 2241

## 2016-12-25 ENCOUNTER — Emergency Department (HOSPITAL_COMMUNITY)
Admission: EM | Admit: 2016-12-25 | Discharge: 2016-12-26 | Disposition: A | Discharge status: Other Acute Inpatient Hospital | Payer: Self-pay | Attending: Emergency Medicine | Admitting: Emergency Medicine

## 2016-12-25 ENCOUNTER — Emergency Department (HOSPITAL_COMMUNITY): Payer: Self-pay

## 2016-12-25 ENCOUNTER — Encounter (HOSPITAL_COMMUNITY): Payer: Self-pay

## 2016-12-25 DIAGNOSIS — J45909 Unspecified asthma, uncomplicated: Secondary | ICD-10-CM | POA: Insufficient documentation

## 2016-12-25 DIAGNOSIS — F1020 Alcohol dependence, uncomplicated: Secondary | ICD-10-CM | POA: Diagnosis present

## 2016-12-25 DIAGNOSIS — F332 Major depressive disorder, recurrent severe without psychotic features: Secondary | ICD-10-CM

## 2016-12-25 DIAGNOSIS — F1994 Other psychoactive substance use, unspecified with psychoactive substance-induced mood disorder: Secondary | ICD-10-CM

## 2016-12-25 DIAGNOSIS — F17210 Nicotine dependence, cigarettes, uncomplicated: Secondary | ICD-10-CM | POA: Insufficient documentation

## 2016-12-25 DIAGNOSIS — F1410 Cocaine abuse, uncomplicated: Secondary | ICD-10-CM

## 2016-12-25 DIAGNOSIS — R45851 Suicidal ideations: Secondary | ICD-10-CM

## 2016-12-25 DIAGNOSIS — F1010 Alcohol abuse, uncomplicated: Secondary | ICD-10-CM | POA: Insufficient documentation

## 2016-12-25 LAB — ETHANOL: ALCOHOL ETHYL (B): 225 mg/dL — AB (ref <5)

## 2016-12-25 LAB — CBC
HEMATOCRIT: 48.8 % (ref 39.0–52.0)
Hemoglobin: 16.9 g/dL (ref 13.0–17.0)
MCH: 29.3 pg (ref 26.0–34.0)
MCHC: 34.6 g/dL (ref 30.0–36.0)
MCV: 84.6 fL (ref 78.0–100.0)
PLATELETS: 244 10*3/uL (ref 150–400)
RBC: 5.77 MIL/uL (ref 4.22–5.81)
RDW: 13.6 % (ref 11.5–15.5)
WBC: 15.9 10*3/uL — AB (ref 4.0–10.5)

## 2016-12-25 LAB — RAPID URINE DRUG SCREEN, HOSP PERFORMED
AMPHETAMINES: NOT DETECTED
BENZODIAZEPINES: POSITIVE — AB
Barbiturates: NOT DETECTED
COCAINE: POSITIVE — AB
Opiates: NOT DETECTED
Tetrahydrocannabinol: NOT DETECTED

## 2016-12-25 LAB — COMPREHENSIVE METABOLIC PANEL
ALBUMIN: 4.3 g/dL (ref 3.5–5.0)
ALT: 127 U/L — ABNORMAL HIGH (ref 17–63)
ANION GAP: 8 (ref 5–15)
AST: 89 U/L — AB (ref 15–41)
Alkaline Phosphatase: 97 U/L (ref 38–126)
BUN: 11 mg/dL (ref 6–20)
CHLORIDE: 107 mmol/L (ref 101–111)
CO2: 24 mmol/L (ref 22–32)
Calcium: 9.1 mg/dL (ref 8.9–10.3)
Creatinine, Ser: 1.04 mg/dL (ref 0.61–1.24)
GFR calc Af Amer: >60 mL/min (ref >60)
GFR calc non Af Amer: >60 mL/min (ref >60)
GLUCOSE: 94 mg/dL (ref 65–99)
POTASSIUM: 3.9 mmol/L (ref 3.5–5.1)
Sodium: 139 mmol/L (ref 135–145)
Total Bilirubin: 0.6 mg/dL (ref 0.3–1.2)
Total Protein: 7.6 g/dL (ref 6.5–8.1)

## 2016-12-25 LAB — CBG MONITORING, ED: GLUCOSE-CAPILLARY: 97 mg/dL (ref 65–99)

## 2016-12-25 LAB — ACETAMINOPHEN LEVEL

## 2016-12-25 LAB — LIPASE, BLOOD: Lipase: 19 U/L (ref 11–51)

## 2016-12-25 LAB — SALICYLATE LEVEL: Salicylate Lvl: <7 mg/dL (ref 2.8–30.0)

## 2016-12-25 MED ORDER — METHOCARBAMOL 1000 MG/10ML IJ SOLN
500.0000 mg | Freq: Once | INTRAVENOUS | Status: AC
  Administered 2016-12-25: 500 mg via INTRAVENOUS
  Filled 2016-12-25: qty 5

## 2016-12-25 MED ORDER — SODIUM CHLORIDE 0.9 % IV BOLUS (SEPSIS)
1000.0000 mL | Freq: Once | INTRAVENOUS | Status: AC
  Administered 2016-12-25: 1000 mL via INTRAVENOUS

## 2016-12-25 MED ORDER — TRAMADOL HCL 50 MG PO TABS
100.0000 mg | ORAL_TABLET | Freq: Once | ORAL | Status: AC
  Administered 2016-12-25: 100 mg via ORAL
  Filled 2016-12-25: qty 2

## 2016-12-25 MED ORDER — ONDANSETRON HCL 4 MG/2ML IJ SOLN
4.0000 mg | Freq: Once | INTRAMUSCULAR | Status: AC
  Administered 2016-12-25: 4 mg via INTRAVENOUS
  Filled 2016-12-25: qty 2

## 2016-12-25 MED ORDER — LIDOCAINE 5 % EX PTCH
1.0000 | MEDICATED_PATCH | CUTANEOUS | Status: DC
  Administered 2016-12-25: 1 via TRANSDERMAL
  Filled 2016-12-25: qty 1

## 2016-12-25 MED ORDER — METHOCARBAMOL 1000 MG/10ML IJ SOLN: 500.0000 mg | Freq: Once | INTRAMUSCULAR | Status: DC

## 2016-12-25 MED ORDER — KETOROLAC TROMETHAMINE 30 MG/ML IJ SOLN
30.0000 mg | Freq: Once | INTRAMUSCULAR | Status: AC
Start: 2016-12-25 — End: 2016-12-25
  Administered 2016-12-25: 30 mg via INTRAVENOUS
  Filled 2016-12-25: qty 1

## 2016-12-25 NOTE — ED Notes (Signed)
Patient transported to CT with sitter.
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
## 2016-12-25 NOTE — ED Notes (Signed)
Bed: WA16 Expected date:  Expected time:  Means of arrival:  Comments: Resus A 
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
## 2016-12-25 NOTE — ED Notes (Signed)
[employer] spoke with family who states pt uses, Crack Cacaine, Weed, Heroin and is bipolar with Schizophrenia, non compliant with meds. Pt admitted to [employer] he used "Cocaine" last night and has been drinking Vodka today, approx a gallon stated.

## 2016-12-25 NOTE — ED Notes (Addendum)
Pt. In burgundy scrubs. Pt. Searched and wanded by security. Pt. Has 2 belongings bags. Pt. Has 1 yellow jean, 1 white shirt and 1 pr. Black socks, 1 pr. Brown boots, no cell phone and wallet(no money). Pt. Belongings locked up at the nurses station in cabinet 16-18 on the RES side.

## 2016-12-25 NOTE — ED Notes (Signed)
Bed: WTR5 Expected date:  Expected time:  Means of arrival:  Comments: 

## 2016-12-25 NOTE — ED Notes (Signed)
States no change in head/neck pain with medication given alert and oriented x 3 call light in reach no reaction to medication noted hospital sitter with pt.

## 2016-12-25 NOTE — ED Notes (Signed)
No reaction to medication noted alert and oriented x 3 hospital sitter at bedside call light in reach.

## 2016-12-25 NOTE — ED Notes (Signed)
No respiratory or acute distress noted alert and oriented call light in reach hospital sitter with pt.

## 2016-12-26 ENCOUNTER — Encounter (HOSPITAL_COMMUNITY): Payer: Self-pay | Admitting: General Practice

## 2016-12-26 ENCOUNTER — Encounter (HOSPITAL_COMMUNITY): Payer: Self-pay | Admitting: Registered Nurse

## 2016-12-26 ENCOUNTER — Inpatient Hospital Stay (HOSPITAL_COMMUNITY)
Admission: AD | Admit: 2016-12-26 | Discharge: 2016-12-30 | DRG: 885 | Disposition: A | Discharge status: Home / Self Care | Payer: No Typology Code available for payment source | Source: Intra-hospital | Attending: Psychiatry | Admitting: Psychiatry

## 2016-12-26 DIAGNOSIS — Z23 Encounter for immunization: Secondary | ICD-10-CM | POA: Diagnosis not present

## 2016-12-26 DIAGNOSIS — F17220 Nicotine dependence, chewing tobacco, uncomplicated: Secondary | ICD-10-CM | POA: Diagnosis present

## 2016-12-26 DIAGNOSIS — F332 Major depressive disorder, recurrent severe without psychotic features: Secondary | ICD-10-CM | POA: Diagnosis not present

## 2016-12-26 DIAGNOSIS — F1110 Opioid abuse, uncomplicated: Secondary | ICD-10-CM | POA: Diagnosis present

## 2016-12-26 DIAGNOSIS — K219 Gastro-esophageal reflux disease without esophagitis: Secondary | ICD-10-CM | POA: Diagnosis present

## 2016-12-26 DIAGNOSIS — F1010 Alcohol abuse, uncomplicated: Secondary | ICD-10-CM | POA: Diagnosis present

## 2016-12-26 DIAGNOSIS — F19929 Other psychoactive substance use, unspecified with intoxication, unspecified: Secondary | ICD-10-CM

## 2016-12-26 DIAGNOSIS — M542 Cervicalgia: Secondary | ICD-10-CM | POA: Diagnosis not present

## 2016-12-26 DIAGNOSIS — F1410 Cocaine abuse, uncomplicated: Secondary | ICD-10-CM | POA: Diagnosis present

## 2016-12-26 DIAGNOSIS — F1994 Other psychoactive substance use, unspecified with psychoactive substance-induced mood disorder: Secondary | ICD-10-CM | POA: Clinically undetermined

## 2016-12-26 DIAGNOSIS — B1920 Unspecified viral hepatitis C without hepatic coma: Secondary | ICD-10-CM | POA: Diagnosis not present

## 2016-12-26 DIAGNOSIS — F17210 Nicotine dependence, cigarettes, uncomplicated: Secondary | ICD-10-CM | POA: Diagnosis present

## 2016-12-26 DIAGNOSIS — F1120 Opioid dependence, uncomplicated: Secondary | ICD-10-CM | POA: Clinically undetermined

## 2016-12-26 DIAGNOSIS — K2270 Barrett's esophagus without dysplasia: Secondary | ICD-10-CM | POA: Diagnosis present

## 2016-12-26 DIAGNOSIS — J45909 Unspecified asthma, uncomplicated: Secondary | ICD-10-CM | POA: Diagnosis present

## 2016-12-26 DIAGNOSIS — Z915 Personal history of self-harm: Secondary | ICD-10-CM | POA: Diagnosis not present

## 2016-12-26 DIAGNOSIS — F329 Major depressive disorder, single episode, unspecified: Secondary | ICD-10-CM | POA: Diagnosis present

## 2016-12-26 DIAGNOSIS — F1020 Alcohol dependence, uncomplicated: Secondary | ICD-10-CM | POA: Diagnosis not present

## 2016-12-26 DIAGNOSIS — Y907 Blood alcohol level of 200-239 mg/100 ml: Secondary | ICD-10-CM | POA: Diagnosis present

## 2016-12-26 DIAGNOSIS — Z886 Allergy status to analgesic agent status: Secondary | ICD-10-CM

## 2016-12-26 DIAGNOSIS — Z885 Allergy status to narcotic agent status: Secondary | ICD-10-CM

## 2016-12-26 DIAGNOSIS — R45851 Suicidal ideations: Secondary | ICD-10-CM

## 2016-12-26 HISTORY — DX: Other psychoactive substance use, unspecified with intoxication, unspecified: F19.929

## 2016-12-26 HISTORY — DX: Other psychoactive substance use, unspecified with psychoactive substance-induced mood disorder: F19.94

## 2016-12-26 MED ORDER — CLONIDINE HCL 0.1 MG PO TABS
0.1000 mg | ORAL_TABLET | ORAL | Status: DC
  Administered 2016-12-29 (×2): 0.1 mg via ORAL
  Filled 2016-12-26 (×4): qty 1

## 2016-12-26 MED ORDER — DICYCLOMINE HCL 20 MG PO TABS
20.0000 mg | ORAL_TABLET | Freq: Four times a day (QID) | ORAL | Status: DC | PRN
  Administered 2016-12-27 – 2016-12-28 (×2): 20 mg via ORAL
  Filled 2016-12-26 (×2): qty 1

## 2016-12-26 MED ORDER — ONDANSETRON 4 MG PO TBDP
4.0000 mg | ORAL_TABLET | Freq: Four times a day (QID) | ORAL | Status: DC | PRN
Start: 2016-12-26 — End: 2016-12-30

## 2016-12-26 MED ORDER — METHOCARBAMOL 500 MG PO TABS
500.0000 mg | ORAL_TABLET | Freq: Three times a day (TID) | ORAL | Status: DC | PRN
  Administered 2016-12-26 – 2016-12-28 (×4): 500 mg via ORAL
  Filled 2016-12-26 (×4): qty 1

## 2016-12-26 MED ORDER — GABAPENTIN 400 MG PO CAPS
400.0000 mg | ORAL_CAPSULE | Freq: Two times a day (BID) | ORAL | Status: DC
  Administered 2016-12-26 – 2016-12-27 (×2): 400 mg via ORAL
  Filled 2016-12-26 (×5): qty 1

## 2016-12-26 MED ORDER — NICOTINE 21 MG/24HR TD PT24
21.0000 mg | MEDICATED_PATCH | Freq: Every day | TRANSDERMAL | Status: DC
  Administered 2016-12-26 – 2016-12-30 (×5): 21 mg via TRANSDERMAL
  Filled 2016-12-26 (×6): qty 1

## 2016-12-26 MED ORDER — CLONIDINE HCL 0.1 MG PO TABS
0.1000 mg | ORAL_TABLET | Freq: Every day | ORAL | Status: DC
Start: 2016-12-31 — End: 2016-12-30
  Filled 2016-12-26: qty 1

## 2016-12-26 MED ORDER — TRAZODONE HCL 50 MG PO TABS
50.0000 mg | ORAL_TABLET | Freq: Every evening | ORAL | Status: DC | PRN
  Administered 2016-12-26 – 2016-12-27 (×2): 50 mg via ORAL
  Filled 2016-12-26 (×2): qty 1

## 2016-12-26 MED ORDER — DIPHENHYDRAMINE HCL 25 MG PO CAPS: 25.0000 mg | ORAL_CAPSULE | Freq: Four times a day (QID) | ORAL | Status: DC | PRN

## 2016-12-26 MED ORDER — DICLOFENAC SODIUM 1 % TD GEL
2.0000 g | Freq: Three times a day (TID) | TRANSDERMAL | Status: DC | PRN
  Administered 2016-12-26: 21:00:00 2 g via TOPICAL
  Filled 2016-12-26: qty 100

## 2016-12-26 MED ORDER — MAGNESIUM HYDROXIDE 400 MG/5ML PO SUSP: 30.0000 mL | Freq: Every day | ORAL | Status: DC | PRN

## 2016-12-26 MED ORDER — HYDROXYZINE HCL 25 MG PO TABS
25.0000 mg | ORAL_TABLET | Freq: Four times a day (QID) | ORAL | Status: DC | PRN
  Administered 2016-12-26 – 2016-12-30 (×5): 25 mg via ORAL
  Filled 2016-12-26 (×5): qty 1
  Filled 2016-12-26: qty 20
  Filled 2016-12-26: qty 1

## 2016-12-26 MED ORDER — ALBUTEROL SULFATE HFA 108 (90 BASE) MCG/ACT IN AERS: 1.0000 | INHALATION_SPRAY | Freq: Four times a day (QID) | RESPIRATORY_TRACT | Status: DC | PRN

## 2016-12-26 MED ORDER — ACETAMINOPHEN 325 MG PO TABS
650.0000 mg | ORAL_TABLET | Freq: Four times a day (QID) | ORAL | Status: DC | PRN
  Administered 2016-12-26: 650 mg via ORAL
  Filled 2016-12-26: qty 2

## 2016-12-26 MED ORDER — ALUM & MAG HYDROXIDE-SIMETH 200-200-20 MG/5ML PO SUSP
30.0000 mL | ORAL | Status: DC | PRN
  Administered 2016-12-28 (×2): 30 mL via ORAL
  Filled 2016-12-26 (×2): qty 30

## 2016-12-26 MED ORDER — LOPERAMIDE HCL 2 MG PO CAPS: 2.0000 mg | ORAL_CAPSULE | ORAL | Status: DC | PRN

## 2016-12-26 MED ORDER — PNEUMOCOCCAL VAC POLYVALENT 25 MCG/0.5ML IJ INJ
0.5000 mL | INJECTION | INTRAMUSCULAR | Status: AC
  Administered 2016-12-27: 0.5 mL via INTRAMUSCULAR

## 2016-12-26 MED ORDER — CLONIDINE HCL 0.1 MG PO TABS
0.1000 mg | ORAL_TABLET | Freq: Four times a day (QID) | ORAL | Status: AC
  Administered 2016-12-26 – 2016-12-28 (×9): 0.1 mg via ORAL
  Filled 2016-12-26 (×12): qty 1

## 2016-12-26 MED ORDER — PANTOPRAZOLE SODIUM 40 MG PO TBEC
40.0000 mg | DELAYED_RELEASE_TABLET | Freq: Two times a day (BID) | ORAL | Status: DC
  Administered 2016-12-26: 40 mg via ORAL
  Filled 2016-12-26: qty 1

## 2016-12-26 NOTE — ED Provider Notes (Signed)
TTS called, recommend inpatient psychiatric admission, no beds at [facility], will work on placement.    Devoria AlbeKnapp, Stephanye Finnicum, MD 12/26/16 [phone]

## 2016-12-26 NOTE — Progress Notes (Signed)
D.  Pt in bed on approach, first night on the unit.  Pt did not feel well enough to attend evening AA group.  Pt did get up for snacks and vital signs.  Pt does endorse passive SI but contracts for safety on the unit.  Pt denies HI/AVH at this time.  A.  Support and encouragement offered, spoke to NP about pain management for neck and new order received.  R. Pt remains safe on the unit, will continue to monitor.

## 2016-12-26 NOTE — Progress Notes (Signed)
Pt has been referred to the following inpt facilities for possible admission:  [facility], [facility], [facility], [facility]  Princess BruinsAquicha Jarel Cuadra, MSW, [employer] TTS Specialist [phone]

## 2016-12-26 NOTE — ED Notes (Signed)
No respiratory or acute distress noted resting in bed with eyes closed hospital sitter at bedside no reaction to medication noted.

## 2016-12-26 NOTE — Tx Team (Signed)
Initial Treatment Plan 12/26/2016 4:18 PM Chris MyrtleStephen R Heney ZOX:[id number]    PATIENT STRESSORS: Financial difficulties Health problems Marital or family conflict Substance abuse   PATIENT STRENGTHS: Capable of independent living General fund of knowledge   PATIENT IDENTIFIED PROBLEMS: Suicidal thoughts  Drug addiction  "need to get my stuff back together and straighten up"  "anger issues"               DISCHARGE CRITERIA:  Ability to meet basic life and health needs Improved stabilization in mood, thinking, and/or behavior Reduction of life-threatening or endangering symptoms to within safe limits Verbal commitment to aftercare and medication compliance Withdrawal symptoms are absent or subacute and managed without 24-hour nursing intervention  PRELIMINARY DISCHARGE PLAN: Attend aftercare/continuing care group Attend 12-step recovery group Outpatient therapy Return to previous living arrangement  PATIENT/FAMILY INVOLVEMENT: This treatment plan has been presented to and reviewed with the patient, Chris Randall.  The patient and family have been given the opportunity to ask questions and make suggestions.  Vinetta BergamoBarbara M Chris Shawn, RN 12/26/2016, 4:18 PM

## 2016-12-26 NOTE — BH Assessment (Signed)
Per Dr. Jannifer FranklinAkintayo and Assunta FoundShuvon Rankin, NP, patient meets criteria for INPT treatment. Patient assigned to Room #300-2. Nursing report [phone]. Support paperwork completed. Patient is voluntary and pending [facility] transport.

## 2016-12-26 NOTE — ED Notes (Signed)
[facility]
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
## 2016-12-26 NOTE — ED Notes (Signed)
[facility] called for transport. 

## 2016-12-26 NOTE — ED Notes (Signed)
No reaction to medication noted resting in bed with eyes closed with hospital sitter with pt call light in reach.
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
## 2016-12-27 DIAGNOSIS — F1190 Opioid use, unspecified, uncomplicated: Secondary | ICD-10-CM

## 2016-12-27 DIAGNOSIS — F1994 Other psychoactive substance use, unspecified with psychoactive substance-induced mood disorder: Secondary | ICD-10-CM

## 2016-12-27 DIAGNOSIS — G8929 Other chronic pain: Secondary | ICD-10-CM

## 2016-12-27 DIAGNOSIS — K219 Gastro-esophageal reflux disease without esophagitis: Secondary | ICD-10-CM

## 2016-12-27 DIAGNOSIS — F332 Major depressive disorder, recurrent severe without psychotic features: Principal | ICD-10-CM

## 2016-12-27 DIAGNOSIS — F1020 Alcohol dependence, uncomplicated: Secondary | ICD-10-CM

## 2016-12-27 DIAGNOSIS — F1390 Sedative, hypnotic, or anxiolytic use, unspecified, uncomplicated: Secondary | ICD-10-CM

## 2016-12-27 DIAGNOSIS — B1920 Unspecified viral hepatitis C without hepatic coma: Secondary | ICD-10-CM

## 2016-12-27 DIAGNOSIS — F17210 Nicotine dependence, cigarettes, uncomplicated: Secondary | ICD-10-CM

## 2016-12-27 DIAGNOSIS — F1410 Cocaine abuse, uncomplicated: Secondary | ICD-10-CM

## 2016-12-27 MED ORDER — QUETIAPINE FUMARATE 50 MG PO TABS
50.0000 mg | ORAL_TABLET | Freq: Every day | ORAL | Status: DC
  Administered 2016-12-27 – 2016-12-29 (×3): 50 mg via ORAL
  Filled 2016-12-27 (×2): qty 1
  Filled 2016-12-27: qty 14
  Filled 2016-12-27 (×3): qty 1

## 2016-12-27 MED ORDER — GABAPENTIN 400 MG PO CAPS
400.0000 mg | ORAL_CAPSULE | Freq: Three times a day (TID) | ORAL | Status: DC
  Administered 2016-12-27 – 2016-12-29 (×7): 400 mg via ORAL
  Filled 2016-12-27 (×2): qty 1
  Filled 2016-12-27: qty 42
  Filled 2016-12-27 (×9): qty 1

## 2016-12-27 MED ORDER — QUETIAPINE FUMARATE 25 MG PO TABS
25.0000 mg | ORAL_TABLET | Freq: Every day | ORAL | Status: DC
  Administered 2016-12-27 – 2016-12-29 (×3): 25 mg via ORAL
  Filled 2016-12-27 (×4): qty 1
  Filled 2016-12-27: qty 14
  Filled 2016-12-27 (×2): qty 1
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
## 2016-12-27 NOTE — Progress Notes (Signed)
Recreation Therapy Notes  Date: 12/27/2016 Time: 9:30am Location: [address] Dayroom  Group Topic: Stress Management  Goal Area(s) Addresses:  Patient will verbalize importance of using healthy stress management.  Patient will identify positive emotions associated with healthy stress management.   Intervention: Stress Management  Activity :  Guided Body Scan. Recreation Therapy Intern introduced the stress management technique of guided body scans. Recreation Therapy Intern played a YouTube video that allowed patients to focus on the tension built up in each part of their body. Patients were to follow along as script was read to engage in the activity.  Education: Stress Management, Discharge Planning.   Education Outcome: Acknowledges edcuation  Clinical Observations/Feedback: Pt did not attend group.  Rachel Meyer, Recreation Therapy Intern    Brody Kump, LRT/CTRS   

## 2016-12-27 NOTE — Tx Team (Signed)
Interdisciplinary Treatment and Diagnostic Plan Update  12/27/2016 Time of Session: [id number] Chris Randall MRN: [id number]  Principal Diagnosis: MDD, recurrent, severe  Secondary Diagnoses: Active Problems:   MDD (major depressive disorder)   Current Medications:  Current Facility-Administered Medications  Medication Dose Route Frequency Provider Last Rate Last Dose  . alum & mag hydroxide-simeth (MAALOX/MYLANTA) 200-200-20 MG/5ML suspension 30 mL  30 mL Oral Q4H PRN Derrill Center, NP      . cloNIDine (CATAPRES) tablet 0.1 mg  0.1 mg Oral QID Derrill Center, NP   0.1 mg at 12/27/16 0753   Followed by  . [START ON 12/29/2016] cloNIDine (CATAPRES) tablet 0.1 mg  0.1 mg Oral BH-qamhs Derrill Center, NP       Followed by  . [START ON 12/31/2016] cloNIDine (CATAPRES) tablet 0.1 mg  0.1 mg Oral QAC breakfast Derrill Center, NP      . diclofenac sodium (VOLTAREN) 1 % transdermal gel 2 g  2 g Topical TID PRN Lindon Romp A, NP   2 g at 12/26/16 2112  . dicyclomine (BENTYL) tablet 20 mg  20 mg Oral Q6H PRN Derrill Center, NP      . gabapentin (NEURONTIN) capsule 400 mg  400 mg Oral BID Derrill Center, NP   400 mg at 12/27/16 0753  . hydrOXYzine (ATARAX/VISTARIL) tablet 25 mg  25 mg Oral Q6H PRN Derrill Center, NP   25 mg at 12/26/16 1710  . loperamide (IMODIUM) capsule 2-4 mg  2-4 mg Oral PRN Derrill Center, NP      . magnesium hydroxide (MILK OF MAGNESIA) suspension 30 mL  30 mL Oral Daily PRN Derrill Center, NP      . methocarbamol (ROBAXIN) tablet 500 mg  500 mg Oral Q8H PRN Derrill Center, NP   500 mg at 12/26/16 1710  . nicotine (NICODERM CQ - dosed in mg/24 hours) patch 21 mg  21 mg Transdermal Daily Derrill Center, NP   21 mg at 12/27/16 0753  . ondansetron (ZOFRAN-ODT) disintegrating tablet 4 mg  4 mg Oral Q6H PRN Derrill Center, NP      . traZODone (DESYREL) tablet 50 mg  50 mg Oral QHS PRN Derrill Center, NP   50 mg at 12/26/16 2112   PTA Medications: Prescriptions Prior  to Admission  Medication Sig Dispense Refill Last Dose  . albuterol (PROVENTIL HFA;VENTOLIN HFA) 108 (90 Base) MCG/ACT inhaler Inhale 1-2 puffs into the lungs every 6 (six) hours as needed for wheezing or shortness of breath. 1 Inhaler 0 Past Month at Unknown time  . diphenhydrAMINE (BENADRYL) 25 MG tablet Take 25 mg by mouth every 6 (six) hours as needed for itching or allergies.   Past Month at Unknown time  . pantoprazole (PROTONIX) 40 MG tablet Take 1 tablet (40 mg total) by mouth 2 (two) times daily. 60 tablet 0 Past Month at Unknown time    Patient Stressors: Financial difficulties Health problems Marital or family conflict Substance abuse  Patient Strengths: Capable of independent living General fund of knowledge  Treatment Modalities: Medication Management, Group therapy, Case management,  1 to 1 session with clinician, Psychoeducation, Recreational therapy.   Physician Treatment Plan for Primary Diagnosis:MDD, recurrent, severe  Medication Management: Evaluate patient's response, side effects, and tolerance of medication regimen.  Therapeutic Interventions: 1 to 1 sessions, Unit Group sessions and Medication administration.  Evaluation of Outcomes: Not Met  Physician Treatment Plan for Secondary Diagnosis: Active Problems:  MDD (major depressive disorder)  Long Term Goal(s):     Short Term Goals:       Medication Management: Evaluate patient's response, side effects, and tolerance of medication regimen.  Therapeutic Interventions: 1 to 1 sessions, Unit Group sessions and Medication administration.  Evaluation of Outcomes: Not Met   RN Treatment Plan for Primary Diagnosis: MDD, recurrent, severe Long Term Goal(s): Knowledge of disease and therapeutic regimen to maintain health will improve  Short Term Goals: Ability to verbalize frustration and anger appropriately will improve, Ability to disclose and discuss suicidal ideas and Ability to identify and develop  effective coping behaviors will improve  Medication Management: RN will administer medications as ordered by provider, will assess and evaluate patient's response and provide education to patient for prescribed medication. RN will report any adverse and/or side effects to prescribing provider.  Therapeutic Interventions: 1 on 1 counseling sessions, Psychoeducation, Medication administration, Evaluate responses to treatment, Monitor vital signs and CBGs as ordered, Perform/monitor CIWA, COWS, AIMS and Fall Risk screenings as ordered, Perform wound care treatments as ordered.  Evaluation of Outcomes: Not Met   LCSW Treatment Plan for Primary Diagnosis: MDD, recurrent, severe Long Term Goal(s): Safe transition to appropriate next level of care at discharge, Engage patient in therapeutic group addressing interpersonal concerns.  Short Term Goals: Engage patient in aftercare planning with referrals and resources, Facilitate patient progression through stages of change regarding substance use diagnoses and concerns and Identify triggers associated with mental health/substance abuse issues  Therapeutic Interventions: Assess for all discharge needs, 1 to 1 time with Social worker, Explore available resources and support systems, Assess for adequacy in community support network, Educate family and significant other(s) on suicide prevention, Complete Psychosocial Assessment, Interpersonal group therapy.  Evaluation of Outcomes: Not Met   Progress in Treatment: Attending groups: Yes. Participating in groups: Yes. Taking medication as prescribed: Yes. Toleration medication: Yes. Family/Significant other contact made: No, will contact:  family member/mother for collateral and to complete SPE. Patient understands diagnosis: Yes. Discussing patient identified problems/goals with staff: Yes. Medical problems stabilized or resolved: Yes. Denies suicidal/homicidal ideation: Yes. Issues/concerns per  patient self-inventory: No. Other: n/a   New problem(s) identified: No, Describe:  n/a  New Short Term/Long Term Goal(s): detox, medication management for mood stabilization; development of comprehensive mental wellness/sobriety plan.   Discharge Plan or Barriers: CSW assessing. Pt is interested in [facility] referrals.   Reason for Continuation of Hospitalization: Aggression Anxiety Depression Medication stabilization Suicidal ideation Withdrawal symptoms  Estimated Length of Stay: tentative discharge Thursday, 12/30/16  Attendees: Patient: 12/27/2016 8:19 AM  Physician: Dr. Sanjuana Letters MD 12/27/2016 8:19 AM  Nursing: Bertell Maria RN 12/27/2016 8:19 AM  RN Care Manager: Lars Pinks CM 12/27/2016 8:19 AM  Social Worker: Maxie Better, LCSW 12/27/2016 8:19 AM  Recreational Therapist: x 12/27/2016 8:19 AM  Other: Lindell Spar NP; Ricky Ala NP; Darnelle Maffucci Money NP 12/27/2016 8:19 AM  Other:  12/27/2016 8:19 AM  Other: 12/27/2016 8:19 AM    Scribe for Treatment Team: [place], LCSW 12/27/2016 8:19 AM

## 2016-12-27 NOTE — Progress Notes (Signed)
[facility] Group Notes:  (Nursing/MHT/Case Management/Adjunct)  Date:  12/27/2016  Time:  10:23 PM  Type of Therapy:  Psychoeducational Skills  Participation Level:  Active  Participation Quality:  Appropriate  Affect:  Appropriate  Cognitive:  Appropriate  Insight:  Improving  Engagement in Group:  Developing/Improving  Modes of Intervention:  Education  Summary of Progress/Problems: Patient shared with the group that he has been dealing with pain throughout the day. As for the theme of the day, his wellness strategy will be to take better care of himself.   Hazle CocaGOODMAN, Caytlyn Evers S 12/27/2016, 10:23 PM

## 2016-12-27 NOTE — Progress Notes (Addendum)
Nursing Note 12/27/2016 [id number]  Data Reports sleeping poor without PRN sleep med.  Rates depression 10/10, hopelessness 10/10, and anxiety 10/10. Affect depressed,blunted.  Denied HI, SI, AVH verbally in AM.  Reported SI on self-inventory sheet- RN followed up and patient said "I felt OK this morning but it changed as the day went on."  Reported SI when RN followed up (denied plan), but agreed to come to staff before acting on any harmful thoughts.  Patient has remained in bed all day except for meals "I feel terrible, I'm aching all over and I'm nervous" despite multiple PRN's and clonidine protocol.  Unable to take NSAIDS d/t medical history.  Reports he cannot take tylenol either. MD ordered scheduled Seroquel which "helps a little." COWS scores 7 & 9 respectively. MD aware of patient's status.   Action Spoke with patient 1:1, nurse offered support to patient throughout shift. Encouraged patient to begin going to groups tomorrow.  Multiple PRN's given for discomfort (anxiety, aches, cramps).  Continues to be monitored on 15 minute checks for safety.  Response Reports brief relief from cramping, anxiety with PRN's. Reports robaxin has not been helpful.  Agrees to try to go to group tomorrow. Remains safe on unit, continues to attend meals but no groups due to discomfort.

## 2016-12-27 NOTE — Plan of Care (Signed)
Problem: Education: Goal: Knowledge of [facility] Education information/materials will improve Outcome: Completed/Met Date Met: 12/27/16 Patient received education on admission Goal: Emotional status will improve Outcome: Progressing C/O anxiety today, MD has adjusted medicine.  Patient out in dayroom interacting some.  Pained expression on face.  Problem: Physical Regulation: Goal: Complications related to the disease process, condition or treatment will be avoided or minimized Outcome: Progressing Patient c/o cramping/sweats and aches.  Treated effectively with PRN's per [facility] and with clonidine protocol

## 2016-12-27 NOTE — Progress Notes (Signed)
Psychoeducational Group Note  Date:  12/27/2016 Time:  0122  Group Topic/Focus:  Wrap-Up Group:   The focus of this group is to help patients review their daily goal of treatment and discuss progress on daily workbooks.  Participation Level: Did Not Attend  Participation Quality:  Not Applicable  Affect:  Not Applicable  Cognitive:  Not Applicable  Insight:  Not Applicable  Engagement in Group: Not Applicable  Additional Comments:  The patient did not attend group since he was not feeling well.   Sulema Braid S 12/27/2016, 1:22 AM

## 2016-12-27 NOTE — Progress Notes (Addendum)
Patient ID: Chris Randall, male   DOB: 08/16/1975, 41 y.o.   MRN: [id number]  Pt currently presents with aflat affect and anxious, impulsive behavior. Pt requests medications often tonight. Reports pain in multiple systems including his neck, back and on the R side of his buttocks. Pt reports he has a painful boil on his buttocks. Pt reports poor sleep with current medication regimen, requests to take increased amount. Pt complains of withdrawal symptoms including body aches, anxiety, nausea and increased restlessness.   Pt provided with medications per providers orders. Pt's labs and vitals were monitored throughout the night. Pt given a 1:1 about emotional and mental status. Pt supported and encouraged to express concerns and questions. Pt educated on medications. Pt remains on a high fall risk, patient educated on fall risk precautions. "Boil assessed no erythrema or weeping noted. Provider notified of patients physical symptoms, see MAR.   Pt's safety ensured with 15 minute and environmental checks. Pt currently denies SI/HI and A/V hallucinations. Pt verbally agrees to seek staff if SI/HI or A/VH occurs and to consult with staff before acting on any harmful thoughts. Will continue POC.

## 2016-12-27 NOTE — H&P (Signed)
Psychiatric Admission Assessment Adult  Patient Identification: Chris Randall MRN:  [id number] Date of Evaluation:  12/27/2016 Chief Complaint:  MDD Substance Abuse Alcohol Abuse Principal Diagnosis: <principal problem not specified> Diagnosis:   Patient Active Problem List   Diagnosis Date Noted  . Suicidal ideation [R45.851] 12/26/2016  . MDD (major depressive disorder), recurrent severe, without psychosis ([place]) [F33.2] 12/26/2016  . Substance induced mood disorder ([place]) [F19.94] 12/26/2016  . Alcohol abuse with physiological dependence ([place]) [F10.20] 12/26/2016  . Cocaine abuse [F14.10] 12/26/2016  . MDD (major depressive disorder) [F32.9] 12/26/2016  . Hepatitis C [B19.20]   . Hematemesis/vomiting blood [K92.0] 08/02/2016  . Abdominal pain [R10.9] 08/02/2016  . Tobacco abuse [Z72.0] 08/02/2016  . Asthma [J45.909] 08/02/2016  . 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 Abnormal transaminases [R74.8]   . Erosive esophagitis [K22.10]   . Erosive gastritis with hemorrhage [K29.61]    History of Present Illness:  41 yo AAM, divorced, part-time employment,  lives with his mother and sister. Background history of SUD (cocaine, opiates, alcohol and BDZ). Brought in by the police. His mom called the police because patient was in a fight with his sister. He had a knife in his hands and threatened to kill himself. He has been expressing suicidal thoughts off and on for the past eight months. BAL was 225 mg/dl, UDS was positive for cocaine and BDZ. Mildly elevated WBBC, AST and ALT.   At interview, patient reports a long history of substance use. Says he uses cocaine and pain pills on a daily basis. Says he occasionally uses alcohol and BDZ. Says he has been living at his sister's place for the past couple of months. He helps her out with her kids and works part time. Says himself and his sister use drugs together. He just found out that his sister has spent  the money for the bills on drugs. Says that was what they were arguing about. Says he blacked out and became very enraged.  Says he was just very angry. Denies any intent to kill self. Denies any thoughts of harming his sister or anyone else. Patient describes emotional roller coaster while under the influence of substances. Says he is okay one minute and goes into rage the other minute. Sleep wake cycle is disrupted depending on pattern of substance use. Reports short-lived periods he felt as if someone touched him. Reports short-lived periods he felt as if someone is out to get him. Typically this is associated with times he hears noises. Patient says he has not had any abnormal perception since he has been here. He is not expressing any abnormal belief currently. He does not feel like harming himself or others. Says he is beginning to withdraw from opioids. Complaining of aches and actively seeking opioids. No overwhelming anxiety. No evidence of mania. Patient denies any other stressors. He does not have access to weapons.   Total Time spent with patient: 1 hour  Past Psychiatric History: SUD and Mood Disorder. He was hospitalized in the 40's. This was in context of suicidal behavior while under the influence of substances. He has not followed up since then. He is not on any psychotropic medication. He has never been into rehab. Says he has attempted suicide on four occassions. He cut self twice and overdosed twice. All of them was in context of substance use. No past manic episodes. No past pervasive depression.   Is the patient at risk to self? Yes.    Has  the patient been a risk to self in the past 6 months? No.  Has the patient been a risk to self within the distant past? Yes.    Is the patient a risk to others? No.  Has the patient been a risk to others in the past 6 months? No.  Has the patient been a risk to others within the distant past? No.   Prior Inpatient Therapy:   Prior Outpatient  Therapy:    Alcohol Screening: Patient refused Alcohol Screening Tool:  (no) 1. How often do you have a drink containing alcohol?: Monthly or less 2. How many drinks containing alcohol do you have on a typical day when you are drinking?: 1 or 2 3. How often do you have six or more drinks on one occasion?: Less than monthly Preliminary Score: 1 Brief Intervention: AUDIT score less than 7 or less-screening does not suggest unhealthy drinking-brief intervention not indicated Substance Abuse History in the last 12 months:  Yes.   Consequences of Substance Abuse: He has been in and out of prison on drug related charges. He does not have any pending legal issues. Estranged from his ex-wife due to drug use. Limited engagement with is children.  Previous Psychotropic Medications: No  Psychological Evaluations: Yes  Past Medical History:  Past Medical History:  Diagnosis Date  . Asthma   . Bilateral varicoceles 03/2015   also left hydrocele per ultrasound.   . Bronchitis   . Esophageal varices ([place]) 2003 vs 2008   pt reprted varices on EGD in [place] "10 to 15 years ago. No varices or portal gastropathy on EGD 08/02/16  . Hematemesis 08/02/2016   Hx GERD, findings on 08/02/16 EGD: esophagitis LA grade B, ?underlying Barrett's esphagus; 4 cm HH; hemorrhagic gastritis and red blood in stomach (source or bleeding); NO VARICES OR PORTALGASTROPATHY.   . Hepatitis C     Past Surgical History:  Procedure Laterality Date  . ESOPHAGOGASTRODUODENOSCOPY (EGD) WITH PROPOFOL N/A 08/02/2016   Procedure: ESOPHAGOGASTRODUODENOSCOPY (EGD) WITH PROPOFOL;  Surgeon: Gatha Mayer, MD;  Location: [facility];  Service: Endoscopy;  Laterality: N/A;  . [place]  . SHOULDER SURGERY     Family History:  Family History  Problem Relation Age of Onset  . Hypertension Other   . Cancer Other    Family Psychiatric  History: Strong family history of addiction and mental illness. No family history of  suicide.  Tobacco Screening: Have you used any form of tobacco in the last 30 days? (Cigarettes, Smokeless Tobacco, Cigars, and/or Pipes): Yes Tobacco use, Select all that apply: 5 or more cigarettes per day Are you interested in Tobacco Cessation Medications?: No, patient refused Counseled patient on smoking cessation including recognizing danger situations, developing coping skills and basic information about quitting provided: Refused/Declined practical counseling Social History:  History  Alcohol Use  . Yes    Comment: drank 1/2 gallon yesterday - rarely drinks r/t stomach ulcers usually     History  Drug Use  . Frequency: 7.0 times per week  . Types: "Crack" cocaine, Cocaine, Hydrocodone, Oxycodone    Additional Social History: Marital status: Divorced Divorced, when?: few years ago What types of issues is patient dealing with in the relationship?: substance abuse; Additional relationship information: n/a  What is your sexual orientation?: heterosexual Has your sexual activity been affected by drugs, alcohol, medication, or emotional stress?: n/a  Does patient have children?: Yes How many children?: 2 How is patient's relationship with their  children?: good. "I have a great relationship with my kids. They are grown now."     History of alcohol / drug use?: Yes Negative Consequences of Use: Work / Youth worker, Charity fundraiser relationships, Museum/gallery curator Withdrawal Symptoms: Agitation, Cramps, Fever / Chills, Irritability, Sweats, Tremors                    Allergies:   Allergies  Allergen Reactions  . Ibuprofen Other (See Comments)    Reaction:  Bothers pts esophageal ulcers   . Vicodin [Hydrocodone-Acetaminophen] Other (See Comments)    Reaction:  Bothers pts esophageal ulcers    Lab Results:  Results for orders placed or performed during the hospital encounter of 12/25/16 (from the past 48 hour(s))  Rapid urine drug screen (hospital performed)     Status: Abnormal    Collection Time: 12/25/16  2:08 PM  Result Value Ref Range   Opiates NONE DETECTED NONE DETECTED   Cocaine POSITIVE (A) NONE DETECTED   Benzodiazepines POSITIVE (A) NONE DETECTED   Amphetamines NONE DETECTED NONE DETECTED   Tetrahydrocannabinol NONE DETECTED NONE DETECTED   Barbiturates NONE DETECTED NONE DETECTED    Comment:        DRUG SCREEN FOR MEDICAL PURPOSES ONLY.  IF CONFIRMATION IS NEEDED FOR ANY PURPOSE, NOTIFY LAB WITHIN 5 DAYS.        LOWEST DETECTABLE LIMITS FOR URINE DRUG SCREEN Drug Class       Cutoff (ng/mL) Amphetamine      1000 Barbiturate      200 Benzodiazepine   981 Tricyclics       191 Opiates          300 Cocaine          300 THC              50   CBG monitoring, ED     Status: None   Collection Time: 12/25/16  2:11 PM  Result Value Ref Range   Glucose-Capillary 97 65 - 99 mg/dL  Comprehensive metabolic panel     Status: Abnormal   Collection Time: 12/25/16  2:14 PM  Result Value Ref Range   Sodium 139 135 - 145 mmol/L   Potassium 3.9 3.5 - 5.1 mmol/L   Chloride 107 101 - 111 mmol/L   CO2 24 22 - 32 mmol/L   Glucose, Bld 94 65 - 99 mg/dL   BUN 11 6 - 20 mg/dL   Creatinine, Ser 1.04 0.61 - 1.24 mg/dL   Calcium 9.1 8.9 - 10.3 mg/dL   Total Protein 7.6 6.5 - 8.1 g/dL   Albumin 4.3 3.5 - 5.0 g/dL   AST 89 (H) 15 - 41 U/L   ALT 127 (H) 17 - 63 U/L   Alkaline Phosphatase 97 38 - 126 U/L   Total Bilirubin 0.6 0.3 - 1.2 mg/dL   GFR calc non Af Amer >60 >60 mL/min   GFR calc Af Amer >60 >60 mL/min    Comment: (NOTE) The eGFR has been calculated using the CKD EPI equation. This calculation has not been validated in all clinical situations. eGFR's persistently <60 mL/min signify possible Chronic Kidney Disease.    Anion gap 8 5 - 15  Ethanol     Status: Abnormal   Collection Time: 12/25/16  2:14 PM  Result Value Ref Range   Alcohol, Ethyl (B) 225 (H) <5 mg/dL    Comment:        LOWEST DETECTABLE LIMIT FOR SERUM ALCOHOL IS 5 mg/dL FOR MEDICAL  PURPOSES ONLY   Salicylate level     Status: None   Collection Time: 12/25/16  2:14 PM  Result Value Ref Range   Salicylate Lvl <8.5 2.8 - 30.0 mg/dL  Acetaminophen level     Status: Abnormal   Collection Time: 12/25/16  2:14 PM  Result Value Ref Range   Acetaminophen (Tylenol), Serum <10 (L) 10 - 30 ug/mL    Comment:        THERAPEUTIC CONCENTRATIONS VARY SIGNIFICANTLY. A RANGE OF 10-30 ug/mL MAY BE AN EFFECTIVE CONCENTRATION FOR MANY PATIENTS. HOWEVER, SOME ARE BEST TREATED AT CONCENTRATIONS OUTSIDE THIS RANGE. ACETAMINOPHEN CONCENTRATIONS >150 ug/mL AT 4 HOURS AFTER INGESTION AND >50 ug/mL AT 12 HOURS AFTER INGESTION ARE OFTEN ASSOCIATED WITH TOXIC REACTIONS.   cbc     Status: Abnormal   Collection Time: 12/25/16  2:14 PM  Result Value Ref Range   WBC 15.9 (H) 4.0 - 10.5 K/uL   RBC 5.77 4.22 - 5.81 MIL/uL   Hemoglobin 16.9 13.0 - 17.0 g/dL   HCT 48.8 39.0 - 52.0 %   MCV 84.6 78.0 - 100.0 fL   MCH 29.3 26.0 - 34.0 pg   MCHC 34.6 30.0 - 36.0 g/dL   RDW 13.6 11.5 - 15.5 %   Platelets 244 150 - 400 K/uL  Lipase, blood     Status: None   Collection Time: 12/25/16  2:14 PM  Result Value Ref Range   Lipase 19 11 - 51 U/L    Blood Alcohol level:  Lab Results  Component Value Date   ETH 225 (H) 12/25/2016   ETH (H) 02/21/2010    133        LOWEST DETECTABLE LIMIT FOR SERUM ALCOHOL IS 5 mg/dL FOR MEDICAL PURPOSES ONLY    Metabolic Disorder Labs:  No results found for: HGBA1C, MPG No results found for: PROLACTIN No results found for: CHOL, TRIG, HDL, CHOLHDL, VLDL, LDLCALC  Current Medications: Current Facility-Administered Medications  Medication Dose Route Frequency Provider Last Rate Last Dose  . alum & mag hydroxide-simeth (MAALOX/MYLANTA) 200-200-20 MG/5ML suspension 30 mL  30 mL Oral Q4H PRN Derrill Center, NP      . cloNIDine (CATAPRES) tablet 0.1 mg  0.1 mg Oral QID Derrill Center, NP   0.1 mg at 12/27/16 1149   Followed by  . [START ON 12/29/2016]  cloNIDine (CATAPRES) tablet 0.1 mg  0.1 mg Oral BH-qamhs Derrill Center, NP       Followed by  . [START ON 12/31/2016] cloNIDine (CATAPRES) tablet 0.1 mg  0.1 mg Oral QAC breakfast Derrill Center, NP      . diclofenac sodium (VOLTAREN) 1 % transdermal gel 2 g  2 g Topical TID PRN Lindon Romp A, NP   2 g at 12/26/16 2112  . dicyclomine (BENTYL) tablet 20 mg  20 mg Oral Q6H PRN Derrill Center, NP   20 mg at 12/27/16 1150  . gabapentin (NEURONTIN) capsule 400 mg  400 mg Oral BID Derrill Center, NP   400 mg at 12/27/16 0753  . hydrOXYzine (ATARAX/VISTARIL) tablet 25 mg  25 mg Oral Q6H PRN Derrill Center, NP   25 mg at 12/26/16 1710  . loperamide (IMODIUM) capsule 2-4 mg  2-4 mg Oral PRN Derrill Center, NP      . magnesium hydroxide (MILK OF MAGNESIA) suspension 30 mL  30 mL Oral Daily PRN Derrill Center, NP      . methocarbamol (ROBAXIN) tablet 500 mg  500 mg Oral Q8H PRN Derrill Center, NP   500 mg at 12/27/16 1150  . nicotine (NICODERM CQ - dosed in mg/24 hours) patch 21 mg  21 mg Transdermal Daily Derrill Center, NP   21 mg at 12/27/16 0753  . ondansetron (ZOFRAN-ODT) disintegrating tablet 4 mg  4 mg Oral Q6H PRN Derrill Center, NP      . traZODone (DESYREL) tablet 50 mg  50 mg Oral QHS PRN Derrill Center, NP   50 mg at 12/26/16 2112   PTA Medications: Prescriptions Prior to Admission  Medication Sig Dispense Refill Last Dose  . albuterol (PROVENTIL HFA;VENTOLIN HFA) 108 (90 Base) MCG/ACT inhaler Inhale 1-2 puffs into the lungs every 6 (six) hours as needed for wheezing or shortness of breath. 1 Inhaler 0 Past Month at Unknown time  . diphenhydrAMINE (BENADRYL) 25 MG tablet Take 25 mg by mouth every 6 (six) hours as needed for itching or allergies.   Past Month at Unknown time  . pantoprazole (PROTONIX) 40 MG tablet Take 1 tablet (40 mg total) by mouth 2 (two) times daily. 60 tablet 0 Past Month at Unknown time    Musculoskeletal: Strength & Muscle Tone: within normal limits Gait &  Station: normal Patient leans: N/A  Psychiatric Specialty Exam: Physical Exam  Constitutional: He is oriented to person, place, and time. He appears well-developed and well-nourished.  HENT:  Head: Normocephalic.  Neck: Normal range of motion.  Respiratory: Effort normal.  Neurological: He is alert and oriented to person, place, and time.  Psychiatric:  As above    ROS  Blood pressure 135/78, pulse 60, temperature 98.9 F (37.2 C), resp. rate 20, height 5' 4"  (1.626 m), weight 78.5 kg (173 lb).Body mass index is 29.7 kg/m.  General Appearance: Casually dressed, some underlying irritability. Good rapport. Not shaky, not sweaty. Not confused. Not internally distracted.   Eye Contact:  Good  Speech:  Clear and Coherent and Normal Rate  Volume:  Normal  Mood:  Dysphoric and Irritable  Affect:  Blunted and mood congruent.   Thought Process:  Linear  Orientation:  Full (Time, Place, and Person)  Thought Content:  Rumination  Suicidal Thoughts:  Denies any current suicidal thoughts.   Homicidal Thoughts:  No  Memory:  Immediate;   Good Recent;   Fair Remote;   Fair  Judgement:  Impaired  Insight:  Shallow  Psychomotor Activity:  Normal  Concentration:  Concentration: Fair and Attention Span: Fair  Recall:  [employer] of Knowledge:  Good  Language:  Good  Akathisia:  Negative  Handed:    AIMS (if indicated):     Assets:  Armed forces logistics/support/administrative officer Physical Health Vocational/Educational  ADL's:  Intact  Cognition:  WNL  Sleep:  Number of Hours: 6.5    Treatment Plan Summary: Patient is very dysphoric. He is coming off multiple substances. He is not currently psychotic. He is not currently expressing any suicidal or homicidal thoughts. We discussed symptomatic withdrawal protocol. We agreed to add low dose Seroquel for mood stabilization. Patient consented to treatment after we reviewed the risks and benefits. Elevated WBCC likely related to stress. No systemic symptoms. He cannot  tolerate NSAIDS due to gastritis in the past. Patient needs motivation to get into rehab. Says he is considering [facility].   Psychiatric: SUD SIMD  Medical: GERD Chronic pain  Psychosocial:  [place] Limited job opportunity  PLAN: 1. Alcohol withdrawal protocol 2. Symptomatic withdrawal protocol for opiates and cocaine  3.  Seroquel 25 mg daily and 50 mg HS for irritability 4. Encourage unit groups and activities 5. Monitor mood, behavior and interaction with peers 6. Motivational enhancement  7. SW would gather collateral from his family and coordinate aftercare.   Observation Level/Precautions:  Detox 15 minute checks  Laboratory:  Follow CBC  Psychotherapy:    Medications:    Consultations:    Discharge Concerns:    Estimated LOS: 3-5 days  Other:     Physician Treatment Plan for Primary Diagnosis: <principal problem not specified> Long Term Goal(s): Improvement in symptoms so as ready for discharge  Short Term Goals: Ability to identify changes in lifestyle to reduce recurrence of condition will improve, Ability to verbalize feelings will improve, Ability to disclose and discuss suicidal ideas, Ability to demonstrate self-control will improve, Ability to identify and develop effective coping behaviors will improve, Ability to maintain clinical measurements within normal limits will improve, Compliance with prescribed medications will improve and Ability to identify triggers associated with substance abuse/mental health issues will improve  Physician Treatment Plan for Secondary Diagnosis: Active Problems:   MDD (major depressive disorder)  Long Term Goal(s): Improvement in symptoms so as ready for discharge  Short Term Goals: Ability to identify changes in lifestyle to reduce recurrence of condition will improve, Ability to verbalize feelings will improve, Ability to disclose and discuss suicidal ideas, Ability to demonstrate self-control will improve, Ability to identify  and develop effective coping behaviors will improve, Ability to maintain clinical measurements within normal limits will improve, Compliance with prescribed medications will improve and Ability to identify triggers associated with substance abuse/mental health issues will improve  I certify that inpatient services furnished can reasonably be expected to improve the patient's condition.    [place], MD 8/6/201812:08 PM

## 2016-12-27 NOTE — [facility] Suicide Risk Assessment (Signed)
[facility] Admission Suicide Risk Assessment   Nursing information obtained from:    Demographic factors:    Current Mental Status:    Loss Factors:    Historical Factors:    Risk Reduction Factors:     Total Time spent with patient: 30 minutes Principal Problem: MDD (major depressive disorder) Diagnosis:   Patient Active Problem List   Diagnosis Date Noted  . Substance induced mood disorder ([facility]) [F19.94] 12/26/2016    Priority: High  . Suicidal ideation [R45.851] 12/26/2016  . MDD (major depressive disorder), recurrent severe, without psychosis ([facility]) [F33.2] 12/26/2016  . Alcohol abuse with physiological dependence ([facility]) [F10.20] 12/26/2016  . Cocaine abuse [F14.10] 12/26/2016  . MDD (major depressive disorder) [F32.9] 12/26/2016  . Hepatitis C [B19.20]   . Hematemesis/vomiting blood [K92.0] 08/02/2016  . Abdominal pain [R10.9] 08/02/2016  . Tobacco abuse [Z72.0] 08/02/2016  . Asthma [J45.909] 08/02/2016  . 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 Abnormal transaminases [R74.8]   . Erosive esophagitis [K22.10]   . Erosive gastritis with hemorrhage [K29.61]    Subjective Data:  41 yo AAM, divorced, part-time employment,  lives with his mother and sister. Background history of SUD (cocaine, opiates, alcohol and BDZ). Brought in by the police. His mom called the police because patient was in a fight with his sister. He had a knife in his hands and threatened to kill himself. He has been expressing suicidal thoughts off and on for the past eight months. BAL was 225 mg/dl, UDS was positive for cocaine and BDZ. Mildly elevated WBBC, AST and ALT.  Daily use of psychoactive substances. Mood swings under the influence. Transient psychosis while under the influence of substances.  Off and on suicide thoughts. Past history of impulsively acting on the thoughts. No current suicidal thoughts. No current homicidal thoughts. No current thoughts of violence.  No current  psychosis. A lot of dysphoria from coming off substances. Cooperative with care. Has accepted recommendations made.  Continued Clinical Symptoms:    The "Alcohol Use Disorders Identification Test", Guidelines for Use in Primary Care, Second Edition.  World Science writer [facility]). Score between 0-7:  no or low risk or alcohol related problems. Score between 8-15:  moderate risk of alcohol related problems. Score between 16-19:  high risk of alcohol related problems. Score 20 or above:  warrants further diagnostic evaluation for alcohol dependence and treatment.   CLINICAL FACTORS:   SUD Mood Disorder   Musculoskeletal: Strength & Muscle Tone: within normal limits Gait & Station: normal Patient leans: N/A  Psychiatric Specialty Exam: Physical Exam  ROS  Blood pressure 135/78, pulse 60, temperature 98.9 F (37.2 C), resp. rate 20, height 5\' 4"  (1.626 m), weight 78.5 kg (173 lb).Body mass index is 29.7 kg/m.  General Appearance: As in H&P  Eye Contact:  As in H&P  Speech:  As in H&P  Volume:  As in H&P  Mood:  As in H&P  Affect: As in H&P  Thought Process:  As in H&P  Orientation:  As in H&P  Thought Content:  As in H&P  Suicidal Thoughts:  As in H&P  Homicidal Thoughts:  As in H&P  Memory:  As in H&P  Judgement:  As in H&P  Insight:  As in H&P  Psychomotor Activity:  As in H&P  Concentration:  As in H&P  Recall:  As in H&P  Fund of Knowledge:  As in H&P  Language:  As in H&P  Akathisia:  As in H&P  Handed:  As in H&P  AIMS (if indicated):     Assets:  As in H&P  ADL's:  As in H&P  Cognition:  As in H&P  Sleep:  Number of Hours: 6.5      COGNITIVE FEATURES THAT CONTRIBUTE TO RISK:  Thought constriction (tunnel vision)    SUICIDE RISK:   Moderate:  Frequent suicidal ideation with limited intensity, and duration, some specificity in terms of plans, no associated intent, good self-control, limited dysphoria/symptomatology, some risk factors present, and  identifiable protective factors, including available and accessible social support.  PLAN OF CARE:  As in H&P  I certify that inpatient services furnished can reasonably be expected to improve the patient's condition.   Georgiann CockerVincent A Kolter Reaver, MD 12/27/2016, 1:06 PM

## 2016-12-27 NOTE — [facility] Group Notes (Signed)
[facility] LCSW Group Therapy  12/27/2016 3:08 PM  Type of Therapy:  Group Therapy  Participation Level:  Did Not Attend-pt invited. Chose to remain in bed.   Summary of Progress/Problems: Today's Topic: Overcoming Obstacles. Patients identified one short term goal and potential obstacles in reaching this goal. Patients processed barriers involved in overcoming these obstacles. Patients identified steps necessary for overcoming these obstacles and explored motivation (internal and external) for facing these difficulties head on.   Tyrik Stetzer N Smart LCSW 12/27/2016, 3:08 PM

## 2016-12-27 NOTE — [facility] Group Notes (Addendum)
[facility] LCSW Aftercare Discharge Planning Group Note   Date/time: 12/27/2016 9:30 AM  Type of Group and Topic: Psychoeducational Group:  Discharge Planning  Participation Level:  Did Not Attend, invited  Description of Group  Discharge planning group reviews patient's anticipated discharge plans and assists patients to anticipate and address any barriers to wellness/recovery in the community.  Suicide prevention education is reviewed with patients in group.  Therapeutic Goals 1. Patients will state their anticipated discharge plan and mental health aftercare 2. Patients will identify potential barriers to wellness in the community setting 3. Patients will engage in problem solving, solution focused discussion of ways to anticipate and address barriers to wellness/recovery  Summary of Patient Progress   Plan for Discharge/Comments:    Transportation Means:   Supports:  Therapeutic Modalities: Motivational Interviewing   Chris Colborn, LCSW Lead Clinical Social Worker Phone:  [phone]  

## 2016-12-28 DIAGNOSIS — G4700 Insomnia, unspecified: Secondary | ICD-10-CM

## 2016-12-28 DIAGNOSIS — M542 Cervicalgia: Secondary | ICD-10-CM

## 2016-12-28 DIAGNOSIS — F419 Anxiety disorder, unspecified: Secondary | ICD-10-CM

## 2016-12-28 MED ORDER — CEPHALEXIN 500 MG PO CAPS
500.0000 mg | ORAL_CAPSULE | Freq: Three times a day (TID) | ORAL | Status: DC
  Administered 2016-12-28 – 2016-12-30 (×5): 500 mg via ORAL
  Filled 2016-12-28: qty 17
  Filled 2016-12-28 (×2): qty 1
  Filled 2016-12-28: qty 2
  Filled 2016-12-28 (×2): qty 1
  Filled 2016-12-28: qty 2
  Filled 2016-12-28 (×4): qty 1

## 2016-12-28 MED ORDER — METHOCARBAMOL 750 MG PO TABS
750.0000 mg | ORAL_TABLET | Freq: Three times a day (TID) | ORAL | Status: DC | PRN
  Administered 2016-12-28 – 2016-12-30 (×4): 750 mg via ORAL
  Filled 2016-12-28 (×4): qty 1

## 2016-12-28 MED ORDER — TRAZODONE HCL 100 MG PO TABS
100.0000 mg | ORAL_TABLET | Freq: Every evening | ORAL | Status: DC | PRN
  Administered 2016-12-28 – 2016-12-29 (×2): 100 mg via ORAL
  Filled 2016-12-28: qty 14
  Filled 2016-12-28 (×2): qty 1

## 2016-12-28 MED ORDER — ESCITALOPRAM OXALATE 5 MG PO TABS
5.0000 mg | ORAL_TABLET | Freq: Every day | ORAL | Status: DC
  Administered 2016-12-28 – 2016-12-29 (×2): 5 mg via ORAL
  Filled 2016-12-28 (×2): qty 1
  Filled 2016-12-28: qty 14
  Filled 2016-12-28 (×2): qty 1

## 2016-12-28 NOTE — Progress Notes (Signed)
Patient ID: Chris Randall, male   DOB: 02/04/1976, 41 y.o.   MRN: [id number]  Pt currently presents with a flat affect and cooperative behavior. Pt mood is anxious. Pt reports to writer that their goal is to "stop this heartburn from being so bad." Pt states "I have a history of esophageal varices and I would like to be put back on Protonix" Pt reports good sleep with current medication regimen. Presents with signs and symptoms of withdrawal including fatigue and flu-like symptoms.  Pt provided with medications per providers orders. Pt's labs and vitals were monitored throughout the night. Pt given a 1:1 about emotional and mental status. Pt supported and encouraged to express concerns and questions. Pt educated on medications.  Pt's safety ensured with 15 minute and environmental checks. Pt currently denies SI/HI and A/V hallucinations. Pt verbally agrees to seek staff if SI/HI or A/VH occurs and to consult with staff before acting on any harmful thoughts. Will continue POC.

## 2016-12-28 NOTE — Plan of Care (Signed)
Problem: Safety: Goal: Periods of time without injury will increase Outcome: Progressing Pt remains safe on the unit tonight 12/28/16

## 2016-12-28 NOTE — Progress Notes (Signed)
[facility] MD Progress Note  12/28/2016 10:27 AM [address]  MRN:  [id number] Subjective:  "I feel a lot better. I'm not hearing voices right now. That has more to do with the drugs I was taking. I've just been really depressed. Also, a lot of neck pain."   Objective: Pt seen and chart reviewed. Pt is alert/oriented x4, calm, cooperative, and appropriate to situation. Pt denies suicidal/homicidal ideation and psychosis and does not appear to be responding to internal stimuli. Pt is ruminative about pain management talking about his neck hurting but reports that he would like to avoid addictive medications and presents as genuinely interesting in improving his substance abuse dependence.   Principal Problem: MDD (major depressive disorder) Diagnosis:   Patient Active Problem List   Diagnosis Date Noted  . MDD (major depressive disorder) [F32.9] 12/26/2016    Priority: High  . Suicidal ideation [R45.851] 12/26/2016  . MDD (major depressive disorder), recurrent severe, without psychosis (HCC) [F33.2] 12/26/2016  . Substance induced mood disorder (HCC) [F19.94] 12/26/2016  . Alcohol abuse with physiological dependence (HCC) [F10.20] 12/26/2016  . Cocaine abuse [F14.10] 12/26/2016  . Hepatitis C [B19.20]   . Hematemesis/vomiting blood [K92.0] 08/02/2016  . Abdominal pain [R10.9] 08/02/2016  . Tobacco abuse [Z72.0] 08/02/2016  . Asthma [J45.909] 08/02/2016  . 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 Abnormal transaminases [R74.8]   . Erosive esophagitis [K22.10]   . Erosive gastritis with hemorrhage [K29.61]    Total Time spent with patient: 25 minutes  Past Psychiatric History: see H&P  Past Medical History:  Past Medical History:  Diagnosis Date  . Asthma   . Bilateral varicoceles 03/2015   also left hydrocele per ultrasound.   . Bronchitis   . Esophageal varices (HCC) 2003 vs 2008   pt reprted varices on EGD in [place] "10 to 15 years ago. No  varices or portal gastropathy on EGD 08/02/16  . Hematemesis 08/02/2016   Hx GERD, findings on 08/02/16 EGD: esophagitis LA grade B, ?underlying Barrett's esphagus; 4 cm HH; hemorrhagic gastritis and red blood in stomach (source or bleeding); NO VARICES OR PORTALGASTROPATHY.   . Hepatitis C     Past Surgical History:  Procedure Laterality Date  . ESOPHAGOGASTRODUODENOSCOPY (EGD) WITH PROPOFOL N/A 08/02/2016   Procedure: ESOPHAGOGASTRODUODENOSCOPY (EGD) WITH PROPOFOL;  Surgeon: Iva Boop, MD;  Location: [facility];  Service: Endoscopy;  Laterality: N/A;  . ROTATOR CUFF REPAIR  1995  . SHOULDER SURGERY     Family History:  Family History  Problem Relation Age of Onset  . Hypertension Other   . Cancer Other    Family Psychiatric  History: see H&P Social History:  History  Alcohol Use  . Yes    Comment: drank 1/2 gallon yesterday - rarely drinks r/t stomach ulcers usually     History  Drug Use  . Frequency: 7.0 times per week  . Types: "Crack" cocaine, Cocaine, Hydrocodone, Oxycodone    Social History   Social History  . Marital status: Divorced    Spouse name: N/A  . Number of children: N/A  . Years of education: N/A   Social History Main Topics  . Smoking status: Current Every Day Smoker    Packs/day: 1.00    Years: 15.00    Types: Cigarettes  . Smokeless tobacco: Current User    Types: Chew  . Alcohol use Yes     Comment: drank 1/2 gallon yesterday - rarely drinks r/t stomach ulcers usually  .  Drug use: Yes    Frequency: 7.0 times per week    Types: "Crack" cocaine, Cocaine, Hydrocodone, Oxycodone  . Sexual activity: Yes   Other Topics Concern  . None   Social History Narrative  . None   Additional Social History:    History of alcohol / drug use?: Yes Negative Consequences of Use: Work / Programmer, multimedia, Copywriter, advertising relationships, Surveyor, quantity Withdrawal Symptoms: Agitation, Cramps, Fever / Chills, Irritability, Sweats, Tremors                    Sleep:  Fair  Appetite:  Fair  Current Medications: Current Facility-Administered Medications  Medication Dose Route Frequency Provider Last Rate Last Dose  . alum & mag hydroxide-simeth (MAALOX/MYLANTA) 200-200-20 MG/5ML suspension 30 mL  30 mL Oral Q4H PRN Oneta Rack, NP      . cloNIDine (CATAPRES) tablet 0.1 mg  0.1 mg Oral QID Oneta Rack, NP   0.1 mg at 12/28/16 0810   Followed by  . [START ON 12/29/2016] cloNIDine (CATAPRES) tablet 0.1 mg  0.1 mg Oral BH-qamhs Oneta Rack, NP       Followed by  . [START ON 12/31/2016] cloNIDine (CATAPRES) tablet 0.1 mg  0.1 mg Oral QAC breakfast Oneta Rack, NP      . diclofenac sodium (VOLTAREN) 1 % transdermal gel 2 g  2 g Topical TID PRN Nira Conn A, NP   2 g at 12/26/16 2112  . dicyclomine (BENTYL) tablet 20 mg  20 mg Oral Q6H PRN Oneta Rack, NP   20 mg at 12/28/16 0810  . gabapentin (NEURONTIN) capsule 400 mg  400 mg Oral TID Georgiann Cocker, MD   400 mg at 12/28/16 0810  . hydrOXYzine (ATARAX/VISTARIL) tablet 25 mg  25 mg Oral Q6H PRN Oneta Rack, NP   25 mg at 12/27/16 1718  . loperamide (IMODIUM) capsule 2-4 mg  2-4 mg Oral PRN Oneta Rack, NP      . magnesium hydroxide (MILK OF MAGNESIA) suspension 30 mL  30 mL Oral Daily PRN Oneta Rack, NP      . methocarbamol (ROBAXIN) tablet 500 mg  500 mg Oral Q8H PRN Oneta Rack, NP   500 mg at 12/28/16 0811  . nicotine (NICODERM CQ - dosed in mg/24 hours) patch 21 mg  21 mg Transdermal Daily Oneta Rack, NP   21 mg at 12/28/16 1610  . ondansetron (ZOFRAN-ODT) disintegrating tablet 4 mg  4 mg Oral Q6H PRN Oneta Rack, NP      . QUEtiapine (SEROQUEL) tablet 25 mg  25 mg Oral Daily Izediuno, Delight Ovens, MD   25 mg at 12/28/16 0810  . QUEtiapine (SEROQUEL) tablet 50 mg  50 mg Oral QHS Izediuno, Delight Ovens, MD   50 mg at 12/27/16 2107  . traZODone (DESYREL) tablet 50 mg  50 mg Oral QHS PRN Oneta Rack, NP   50 mg at 12/27/16 2107    Lab Results: No results found  for this or any previous visit (from the past 48 hour(s)).  Blood Alcohol level:  Lab Results  Component Value Date   ETH 225 (H) 12/25/2016   ETH (H) 02/21/2010    133        LOWEST DETECTABLE LIMIT FOR SERUM ALCOHOL IS 5 mg/dL FOR MEDICAL PURPOSES ONLY    Metabolic Disorder Labs: No results found for: HGBA1C, MPG No results found for: PROLACTIN No results found for: CHOL, TRIG, HDL,  CHOLHDL, VLDL, LDLCALC  Physical Findings: AIMS: Facial and Oral Movements Muscles of Facial Expression: None, normal Lips and Perioral Area: None, normal Jaw: None, normal Tongue: None, normal,Extremity Movements Upper (arms, wrists, hands, fingers): None, normal Lower (legs, knees, ankles, toes): None, normal, Trunk Movements Neck, shoulders, hips: None, normal, Overall Severity Severity of abnormal movements (highest score from questions above): None, normal Incapacitation due to abnormal movements: None, normal Patient's awareness of abnormal movements (rate only patient's report): No Awareness, Dental Status Current problems with teeth and/or dentures?: No Does patient usually wear dentures?: No  CIWA:  CIWA-Ar Total: 5 COWS:  COWS Total Score: 7  Musculoskeletal: Strength & Muscle Tone: within normal limits Gait & Station: normal Patient leans: N/A  Psychiatric Specialty Exam: Physical Exam  Review of Systems  Psychiatric/Behavioral: Positive for depression and substance abuse. Negative for hallucinations and suicidal ideas. The patient is nervous/anxious and has insomnia.   All other systems reviewed and are negative.   Blood pressure 113/76, pulse 79, temperature 98 F (36.7 C), temperature source Oral, resp. rate 16, height 5\' 4"  (1.626 m), weight 78.5 kg (173 lb).Body mass index is 29.7 kg/m.  General Appearance: Casual and Fairly Groomed  Eye Contact:  Fair  Speech:  Clear and Coherent and Normal Rate  Volume:  Normal  Mood:  Anxious  Affect:  Appropriate, Congruent and  Depressed  Thought Process:  Coherent, Goal Directed, Linear and Descriptions of Associations: Intact  Orientation:  Full (Time, Place, and Person)  Thought Content:  Focused on treatment options, focused on neck pain, asking for more trazodone  Suicidal Thoughts:  No  Homicidal Thoughts:  No  Memory:  Immediate;   Fair Recent;   Fair Remote;   Fair  Judgement:  Fair  Insight:  Fair  Psychomotor Activity:  Normal  Concentration:  Concentration: Fair and Attention Span: Fair  Recall:  FiservFair  Fund of Knowledge:  Fair  Language:  Fair  Akathisia:  No  Handed:    AIMS (if indicated):     Assets:  Communication Skills Desire for Improvement Resilience Social Support  ADL's:  Intact  Cognition:  WNL  Sleep:  Number of Hours: 6.75   Treatment Plan Summary: MDD (major depressive disorder), recurrent severe, 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

## 2016-12-28 NOTE — [facility] Group Notes (Signed)
[facility] Group Notes:  Recovery  Date:  12/28/2016  Time:  10:14 AM  Type of Therapy:  Psychoeducational Skills  Participation Level:  Did Not Attend  Participation Quality:  N/A  Affect:  N/A  Cognitive:  N/A  Insight:  None  Engagement in Group:  None  Modes of Intervention:  Discussion and Education  Summary of Progress/Problems: Patient was invited to group however he did not attend.

## 2016-12-28 NOTE — [facility] Suicide Risk Assessment (Signed)
[facility] INPATIENT:  Family/Significant Other Suicide Prevention Education  Suicide Prevention Education:  Contact Attempts: Chris Randall (pt's mother) [phone] has been identified by the patient as the family member/significant other with whom the patient will be residing, and identified as the person(s) who will aid the patient in the event of a mental health crisis.  With written consent from the patient, two attempts were made to provide suicide prevention education, prior to and/or following the patient's discharge.  We were unsuccessful in providing suicide prevention education.  A suicide education pamphlet was given to the patient to share with family/significant other.  Date and time of first attempt: 12/28/16 at 10:03AM (voicemail left requesting call back at her earliest convenience).  Date and time of second attempt: 12/29/16 at 8:40AM  Pulte HomesHeather N Smart LCSW 12/28/2016, 10:04 AM

## 2016-12-28 NOTE — [facility] Group Notes (Signed)
[facility] LCSW Group Therapy  12/28/2016 1:59 PM  Type of Therapy:  Group Therapy  Participation Level:  Active  Participation Quality:  Attentive  Affect:  Appropriate  Cognitive:  Alert and Oriented  Insight:  Improving  Engagement in Therapy:  Improving  Modes of Intervention:  Confrontation, Discussion, Education, Socialization and Support  Summary of Progress/Problems: [facility] Speaker came to talk about his personal journey with substance abuse and addiction. The pt processed ways by which to relate to the speaker. [facility] speaker provided handouts and educational information pertaining to groups and services offered by the [facility].   Tiler Brandis N Smart LCSW 12/28/2016, 1:59 PM  

## 2016-12-28 NOTE — Progress Notes (Signed)
Patient ID: Chris Randall, male   DOB: 02/25/1976, 41 y.o.   MRN: [id number]  DAR: Pt. Denies HI and A/V Hallucinations. He does endorse passive SI but he is able to contract for safety. He reports sleep is poor, appetite is fair, energy level is low, and concentration is poor. He rates depression and hopelessness 10/10 today. He reports withdrawal symptoms including cravings, chilling, cramping, and irritability. He does report pain that is generalized and is receiving PRN medications for this complaint. He does report, "I have a boil on my ass." He is depressed in mood and anxious in affect. He is seen in the milieu intermittently and speaking with some of his peers. Support and encouragement provided to the patient. Scheduled medications administered to patient per physician's orders. Patient is more positive after he speaks with NP and is able to have his questions answered. He reports his goal is, "trying my best to over come my problem." He is seen on the phone this afternoon and it appeared that he was talking to his mother as he said "mom" when talking. He was argumentative on the phone but ended the phone call quickly. Q15 minute checks are maintained for safety.

## 2016-12-29 ENCOUNTER — Encounter (HOSPITAL_COMMUNITY): Payer: Self-pay | Admitting: Psychiatry

## 2016-12-29 DIAGNOSIS — F1410 Cocaine abuse, uncomplicated: Secondary | ICD-10-CM | POA: Clinically undetermined

## 2016-12-29 DIAGNOSIS — F19929 Other psychoactive substance use, unspecified with intoxication, unspecified: Secondary | ICD-10-CM | POA: Clinically undetermined

## 2016-12-29 DIAGNOSIS — F1120 Opioid dependence, uncomplicated: Secondary | ICD-10-CM | POA: Clinically undetermined

## 2016-12-29 DIAGNOSIS — F1994 Other psychoactive substance use, unspecified with psychoactive substance-induced mood disorder: Secondary | ICD-10-CM

## 2016-12-29 DIAGNOSIS — K219 Gastro-esophageal reflux disease without esophagitis: Secondary | ICD-10-CM | POA: Diagnosis present

## 2016-12-29 DIAGNOSIS — R45851 Suicidal ideations: Secondary | ICD-10-CM

## 2016-12-29 DIAGNOSIS — F39 Unspecified mood [affective] disorder: Secondary | ICD-10-CM

## 2016-12-29 HISTORY — DX: Other psychoactive substance use, unspecified with psychoactive substance-induced mood disorder: F19.94

## 2016-12-29 HISTORY — DX: Other psychoactive substance use, unspecified with intoxication, unspecified: F19.929

## 2016-12-29 MED ORDER — QUETIAPINE FUMARATE 25 MG PO TABS: 25.0000 mg | ORAL_TABLET | Freq: Every day | ORAL | 0 refills | Status: DC

## 2016-12-29 MED ORDER — HYDROXYZINE HCL 25 MG PO TABS: 25.0000 mg | ORAL_TABLET | Freq: Four times a day (QID) | ORAL | 0 refills | Status: DC | PRN

## 2016-12-29 MED ORDER — TRAZODONE HCL 100 MG PO TABS: 100.0000 mg | ORAL_TABLET | Freq: Every evening | ORAL | 0 refills | Status: DC | PRN

## 2016-12-29 MED ORDER — METHOCARBAMOL 750 MG PO TABS: 750.0000 mg | ORAL_TABLET | Freq: Three times a day (TID) | ORAL | 0 refills | Status: DC | PRN

## 2016-12-29 MED ORDER — QUETIAPINE FUMARATE 50 MG PO TABS: 50.0000 mg | ORAL_TABLET | Freq: Every day | ORAL | 0 refills | Status: DC

## 2016-12-29 MED ORDER — PANTOPRAZOLE SODIUM 40 MG PO TBEC
40.0000 mg | DELAYED_RELEASE_TABLET | Freq: Two times a day (BID) | ORAL | Status: DC
  Administered 2016-12-29 – 2016-12-30 (×2): 40 mg via ORAL
  Filled 2016-12-29 (×2): qty 1
  Filled 2016-12-29: qty 28
  Filled 2016-12-29 (×2): qty 1

## 2016-12-29 MED ORDER — PANTOPRAZOLE SODIUM 40 MG PO TBEC
DELAYED_RELEASE_TABLET | ORAL | Status: AC
  Filled 2016-12-29: qty 1

## 2016-12-29 MED ORDER — ESCITALOPRAM OXALATE 5 MG PO TABS: 5.0000 mg | ORAL_TABLET | Freq: Every day | ORAL | 0 refills | Status: DC

## 2016-12-29 MED ORDER — CEPHALEXIN 500 MG PO CAPS: 500.0000 mg | ORAL_CAPSULE | Freq: Three times a day (TID) | ORAL | Status: DC

## 2016-12-29 MED ORDER — PANTOPRAZOLE SODIUM 40 MG PO TBEC: 40.0000 mg | DELAYED_RELEASE_TABLET | Freq: Two times a day (BID) | ORAL | 0 refills | Status: DC

## 2016-12-29 MED ORDER — GABAPENTIN 400 MG PO CAPS: 400.0000 mg | ORAL_CAPSULE | Freq: Three times a day (TID) | ORAL | 0 refills | Status: DC

## 2016-12-29 NOTE — Progress Notes (Signed)
  [facility] :  Will you be returning to the same living situation after discharge:  No.  [facility] Thursday, 8/9/18At discharge, do you have transportation home?: Yes,  taxi voucher in chart; PATIENT MUST D/C NO LATER THAN 7AM ON THURS, 12/30/16 Do you have the ability to pay for your medications: Yes,  mental health  Release of information consent forms completed and submitted to medical records by CSW.  Patient to Follow up at: Follow-up Information    [facility] Follow up.   Specialty:  Behavioral Health Why:  Walk in within 7 days of hospital/rehab discharge to be assessed for mental health services including: Medication management; counseling; support groups. Walk in hours: Mon-Fri 8am-9am. Thank you.  Contact information: [address] [place] [address] [phone]        Services, [facility] Follow up on 12/30/2016.   Why:  referral needed.  Contact information: [address] [place] [address] [phone]        [facility] Follow up.   Specialty:  Addiction Medicine Why:  Referral faxed: 12/27/16 Contact information: [address] [place] [address] [phone]           Next level of care provider [facility] Link:no  Safety Planning and Suicide Prevention discussed: Yes,  SPE completed with pt; contact attempts made with pt's mother. Message left. SPI pamphlet and Mobile crisis information provided.  Have you used any form of tobacco in the last 30 days? (Cigarettes, Smokeless Tobacco, Cigars, and/or Pipes): Yes  Has patient been referred to the Quitline?: Patient refused referral  Patient has been referred for addiction treatment: Yes  Pulte HomesHeather N Smart, LCSW 12/29/2016, 8:53 AM

## 2016-12-29 NOTE — Tx Team (Signed)
Interdisciplinary Treatment and Diagnostic Plan Update  12/29/2016 Time of Session: [id number] Chris OEHLERT MRN: [id number]  Principal Diagnosis: MDD, recurrent, severe  Secondary Diagnoses: Principal Problem:   MDD (major depressive disorder), recurrent severe, without psychosis (HCC) Active Problems:   Alcohol abuse with physiological dependence (HCC)   Cocaine abuse   Current Medications:  Current Facility-Administered Medications  Medication Dose Route Frequency Provider Last Rate Last Dose  . alum & mag hydroxide-simeth (MAALOX/MYLANTA) 200-200-20 MG/5ML suspension 30 mL  30 mL Oral Q4H PRN Oneta Rack, NP   30 mL at 12/28/16 2154  . cephALEXin (KEFLEX) capsule 500 mg  500 mg Oral Q8H Withrow, John C, FNP   500 mg at 12/29/16 5784  . cloNIDine (CATAPRES) tablet 0.1 mg  0.1 mg Oral Esperanza Heir, NP   0.1 mg at 12/29/16 0816   Followed by  . [START ON 12/31/2016] cloNIDine (CATAPRES) tablet 0.1 mg  0.1 mg Oral QAC breakfast Oneta Rack, NP      . diclofenac sodium (VOLTAREN) 1 % transdermal gel 2 g  2 g Topical TID PRN Nira Conn A, NP   2 g at 12/26/16 2112  . dicyclomine (BENTYL) tablet 20 mg  20 mg Oral Q6H PRN Oneta Rack, NP   20 mg at 12/28/16 0810  . escitalopram (LEXAPRO) tablet 5 mg  5 mg Oral Daily Beau Fanny, FNP   5 mg at 12/29/16 0816  . gabapentin (NEURONTIN) capsule 400 mg  400 mg Oral TID Georgiann Cocker, MD   400 mg at 12/29/16 0816  . hydrOXYzine (ATARAX/VISTARIL) tablet 25 mg  25 mg Oral Q6H PRN Oneta Rack, NP   25 mg at 12/28/16 2155  . loperamide (IMODIUM) capsule 2-4 mg  2-4 mg Oral PRN Oneta Rack, NP      . magnesium hydroxide (MILK OF MAGNESIA) suspension 30 mL  30 mL Oral Daily PRN Oneta Rack, NP      . methocarbamol (ROBAXIN) tablet 750 mg  750 mg Oral Q8H PRN Beau Fanny, FNP   750 mg at 12/29/16 0820  . nicotine (NICODERM CQ - dosed in mg/24 hours) patch 21 mg  21 mg Transdermal Daily Oneta Rack,  NP   21 mg at 12/29/16 0815  . ondansetron (ZOFRAN-ODT) disintegrating tablet 4 mg  4 mg Oral Q6H PRN Oneta Rack, NP      . QUEtiapine (SEROQUEL) tablet 25 mg  25 mg Oral Daily Izediuno, Delight Ovens, MD   25 mg at 12/29/16 0817  . QUEtiapine (SEROQUEL) tablet 50 mg  50 mg Oral QHS Izediuno, Delight Ovens, MD   50 mg at 12/28/16 2155  . traZODone (DESYREL) tablet 100 mg  100 mg Oral QHS PRN Beau Fanny, FNP   100 mg at 12/28/16 2155   PTA Medications: Prescriptions Prior to Admission  Medication Sig Dispense Refill Last Dose  . albuterol (PROVENTIL HFA;VENTOLIN HFA) 108 (90 Base) MCG/ACT inhaler Inhale 1-2 puffs into the lungs every 6 (six) hours as needed for wheezing or shortness of breath. 1 Inhaler 0 Past Month at Unknown time  . diphenhydrAMINE (BENADRYL) 25 MG tablet Take 25 mg by mouth every 6 (six) hours as needed for itching or allergies.   Past Month at Unknown time  . pantoprazole (PROTONIX) 40 MG tablet Take 1 tablet (40 mg total) by mouth 2 (two) times daily. 60 tablet 0 Past Month at Unknown time    Patient Stressors: Surveyor, quantity  difficulties Health problems Marital or family conflict Substance abuse  Patient Strengths: Capable of independent living General fund of knowledge  Treatment Modalities: Medication Management, Group therapy, Case management,  1 to 1 session with clinician, Psychoeducation, Recreational therapy.   Physician Treatment Plan for Primary Diagnosis:MDD, recurrent, severe  Medication Management: Evaluate patient's response, side effects, and tolerance of medication regimen.  Therapeutic Interventions: 1 to 1 sessions, Unit Group sessions and Medication administration.  Evaluation of Outcomes: Adequate for discharge   Physician Treatment Plan for Secondary Diagnosis: Principal Problem:   MDD (major depressive disorder), recurrent severe, without psychosis (HCC) Active Problems:   Alcohol abuse with physiological dependence (HCC)   Cocaine  abuse  Long Term Goal(s): Improvement in symptoms so as ready for discharge Improvement in symptoms so as ready for discharge   Short Term Goals: Ability to identify changes in lifestyle to reduce recurrence of condition will improve Ability to verbalize feelings will improve Ability to disclose and discuss suicidal ideas Ability to demonstrate self-control will improve Ability to identify and develop effective coping behaviors will improve Ability to maintain clinical measurements within normal limits will improve Compliance with prescribed medications will improve Ability to identify triggers associated with substance abuse/mental health issues will improve Ability to identify changes in lifestyle to reduce recurrence of condition will improve Ability to verbalize feelings will improve Ability to disclose and discuss suicidal ideas Ability to demonstrate self-control will improve Ability to identify and develop effective coping behaviors will improve Ability to maintain clinical measurements within normal limits will improve Compliance with prescribed medications will improve Ability to identify triggers associated with substance abuse/mental health issues will improve     Medication Management: Evaluate patient's response, side effects, and tolerance of medication regimen.  Therapeutic Interventions: 1 to 1 sessions, Unit Group sessions and Medication administration.  Evaluation of Outcomes: Adequate for discharge   RN Treatment Plan for Primary Diagnosis: MDD, recurrent, severe Long Term Goal(s): Knowledge of disease and therapeutic regimen to maintain health will improve  Short Term Goals: Ability to verbalize frustration and anger appropriately will improve, Ability to disclose and discuss suicidal ideas and Ability to identify and develop effective coping behaviors will improve  Medication Management: RN will administer medications as ordered by provider, will assess and  evaluate patient's response and provide education to patient for prescribed medication. RN will report any adverse and/or side effects to prescribing provider.  Therapeutic Interventions: 1 on 1 counseling sessions, Psychoeducation, Medication administration, Evaluate responses to treatment, Monitor vital signs and CBGs as ordered, Perform/monitor CIWA, COWS, AIMS and Fall Risk screenings as ordered, Perform wound care treatments as ordered.  Evaluation of Outcomes: Adequate for discharge   LCSW Treatment Plan for Primary Diagnosis: MDD, recurrent, severe Long Term Goal(s): Safe transition to appropriate next level of care at discharge, Engage patient in therapeutic group addressing interpersonal concerns.  Short Term Goals: Engage patient in aftercare planning with referrals and resources, Facilitate patient progression through stages of change regarding substance use diagnoses and concerns and Identify triggers associated with mental health/substance abuse issues  Therapeutic Interventions: Assess for all discharge needs, 1 to 1 time with Social worker, Explore available resources and support systems, Assess for adequacy in community support network, Educate family and significant other(s) on suicide prevention, Complete Psychosocial Assessment, Interpersonal group therapy.  Evaluation of Outcomes: Adequate for discharge    Progress in Treatment: Attending groups: Yes. Participating in groups: Yes. Taking medication as prescribed: Yes. Toleration medication: Yes. Family/Significant other contact made: Contact attempts  made with pt's mother; SPE completed with pt.  Patient understands diagnosis: Yes. Discussing patient identified problems/goals with staff: Yes. Medical problems stabilized or resolved: Yes. Denies suicidal/homicidal ideation: Yes. Issues/concerns per patient self-inventory: No. Other: n/a   New problem(s) identified: No, Describe:  n/a  New Short Term/Long Term  Goal(s): detox, medication management for mood stabilization; development of comprehensive mental wellness/sobriety plan.   Discharge Plan or Barriers:  [facility] screening for admission: 12/30/16 (Thursday). [facility] for outpatient. Pt provided with [facility] pamphlet and AA/NA information.   Reason for Continuation of Hospitalization: medication management   Estimated Length of Stay: 7am via taxi on Thursday, 12/30/16.   Attendees: Patient: 12/29/2016 8:55 AM  Physician: Dr. Elna BreslowEappen MD 12/29/2016 8:55 AM  Nursing: Noel GeroldJan, Janet RN 12/29/2016 8:55 AM  RN Care Manager: Onnie BoerJennifer Clark CM 12/29/2016 8:55 AM  Social Worker: Trula SladeHeather Smart, LCSW 12/29/2016 8:55 AM  Recreational Therapist: x 12/29/2016 8:55 AM  Other: Armandina StammerAgnes Nwoko NP;  Denzil MagnusonLashunda Thomas NP; Feliz Beamravis Money NP 12/29/2016 8:55 AM  Other:  12/29/2016 8:55 AM  Other: 12/29/2016 8:55 AM    Scribe for Treatment Team: Ledell PeoplesHeather N Smart, LCSW 12/29/2016 8:55 AM

## 2016-12-29 NOTE — Progress Notes (Signed)
Pt attended NA group this evening.  

## 2016-12-29 NOTE — Plan of Care (Signed)
Problem: Physical Regulation: Goal: Ability to maintain clinical measurements within normal limits will improve Outcome: Progressing Pt blood pressure remains within normal limits as of this shift

## 2016-12-29 NOTE — Discharge Summary (Signed)
Physician Discharge Summary Note  Patient:  Chris Randall is an 41 y.o., male MRN:  [id number] DOB:  03-Dec-1975 Patient phone:  [phone] (home)  Patient address:   [address] [place] [address],  Total Time spent with patient: 30 minutes  Date of Admission:  12/26/2016 Date of Discharge: 12/30/2016  Reason for Admission:  Substance abuse   Principal Problem: Substance or medication-induced bipolar and related disorder with onset during intoxication [facility]) Discharge Diagnoses: Patient Active Problem List   Diagnosis Date Noted  . Opioid use disorder, severe, dependence (HCC) [F11.20] 12/29/2016  . Cocaine use disorder, mild, abuse [F14.10] 12/29/2016  . 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 GERD (gastroesophageal reflux disease) [K21.9] 12/29/2016  . Suicidal ideation [R45.851] 12/26/2016  . Substance induced mood disorder (HCC) [F19.94] 12/26/2016  . Alcohol abuse with physiological dependence (HCC) [F10.20] 12/26/2016  . Hepatitis C [B19.20]   . Hematemesis/vomiting blood [K92.0] 08/02/2016  . Abdominal pain [R10.9] 08/02/2016  . Tobacco abuse [Z72.0] 08/02/2016  . Asthma [J45.909] 08/02/2016  . 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 Abnormal transaminases [R74.8]   . Erosive esophagitis [K22.10]   . Erosive gastritis with hemorrhage [K29.61]     Past Psychiatric History:  SUD and Mood Disorder. He was hospitalized in the 41's. This was in context of suicidal behavior while under the influence of substances. He has not followed up since then. He is not on any psychotropic medication. He has never been into rehab. Says he has attempted suicide on four occassions. He cut self twice and overdosed twice. All of them was in context of substance use. No past manic episodes. No past pervasive depression.   Past Medical History:  Past Medical History:   Diagnosis Date  . Asthma   . Bilateral varicoceles 03/2015   also left hydrocele per ultrasound.   . Bronchitis   . Esophageal varices (HCC) 2003 vs 2008   pt reprted varices on EGD in [place] "10 to 15 years ago. No varices or portal gastropathy on EGD 08/02/16  . Hematemesis 08/02/2016   Hx GERD, findings on 08/02/16 EGD: esophagitis LA grade B, ?underlying Barrett's esphagus; 4 cm HH; hemorrhagic gastritis and red blood in stomach (source or bleeding); NO VARICES OR PORTALGASTROPATHY.   . Hepatitis C   . Substance or medication-induced bipolar and related disorder with onset during intoxication (HCC) 12/29/2016   Opioid, cocaine, alcohol    Past Surgical History:  Procedure Laterality Date  . ESOPHAGOGASTRODUODENOSCOPY (EGD) WITH PROPOFOL N/A 08/02/2016   Procedure: ESOPHAGOGASTRODUODENOSCOPY (EGD) WITH PROPOFOL;  Surgeon: Iva Boop, MD;  Location: [facility] ENDOSCOPY;  Service: Endoscopy;  Laterality: N/A;  . ROTATOR CUFF REPAIR  1995  . SHOULDER SURGERY     Family History:  Family History  Problem Relation Age of Onset  . Hypertension Other   . Cancer Other    Family Psychiatric  History: Strong family history of addiction and mental illness. No family history of suicide.  Social History:  History  Alcohol Use  . Yes    Comment: drank 1/2 gallon yesterday - rarely drinks r/t stomach ulcers usually     History  Drug Use  . Frequency: 7.0 times per week  . Types: "Crack" cocaine, Cocaine, Hydrocodone, Oxycodone    Social History   Social History  . Marital status: Divorced    Spouse name: N/A  . Number of children: N/A  .  Years of education: N/A   Social History Main Topics  . Smoking status: Current Every Day Smoker    Packs/day: 1.00    Years: 15.00    Types: Cigarettes  . Smokeless tobacco: Current User    Types: Chew  . Alcohol use Yes     Comment: drank 1/2 gallon yesterday - rarely drinks r/t stomach ulcers usually  . Drug use: Yes    Frequency: 7.0  times per week    Types: "Crack" cocaine, Cocaine, Hydrocodone, Oxycodone  . Sexual activity: Yes   Other Topics Concern  . None   Social History Narrative  . None    Hospital Course:  41 yo AAM, divorced, part-time employment,  lives with his mother and sister. Background history of SUD (cocaine, opiates, alcohol and BDZ). Brought in by the police. His mom called the police because patient was in a fight with his sister. He had a knife in his hands and threatened to kill himself. He has been expressing suicidal thoughts off and on for the past eight months. BAL was 225 mg/dl, UDS was positive for cocaine and BDZ. Mildly elevated WBBC, AST and ALT.   At interview, patient reports a long history of substance use. Says he uses cocaine and pain pills on a daily basis. Says he occasionally uses alcohol and BDZ. Says he has been living at his sister's place for the past couple of months. He helps her out with her kids and works part time. Says himself and his sister use drugs together. He just found out that his sister has spent the money for the bills on drugs. Says that was what they were arguing about. Says he blacked out and became very enraged.  Says he was just very angry. Denies any intent to kill self. Denies any thoughts of harming his sister or anyone else. Patient describes emotional roller coaster while under the influence of substances. Says he is okay one minute and goes into rage the other minute. Sleep wake cycle is disrupted depending on pattern of substance use. Reports short-lived periods he felt as if someone touched him. Reports short-lived periods he felt as if someone is out to get him. Typically this is associated with times he hears noises. Patient says he has not had any abnormal perception since he has been here. He is not expressing any abnormal belief currently. He does not feel like harming himself or others. Says he is beginning to withdraw from opioids. Complaining of aches  and actively seeking opioids. No overwhelming anxiety. No evidence of mania. Patient denies any other stressors. He does not have access to weapons.    Discharge Evaluation: After the above admission assessment and during this hospital course, patients presenting symptoms were identified. Patient endorsed a substance abuse history as noted above. Detoxification protocol administered as necessary. Patient was treated and discharged with the following medications;Robaxin to 750mg  po q8h prn spasm, Trazodone to 100mg  po qhs prn insomnia, Lexapro 5mg  po daily for depression, Gabapentin 400mg  po tid , Protonix 40 mg po bid with prior to meals for GERD, Seroquel 25 po daily and 50mg  po qhs for psychotic features, Vistaril 25 mg po Q6 hours as needed for anxiety. AA/NA meetings were offered & held on the unit. Prior to discharge, while on the unit, patient was able to verbalize learned coping skills for better management of depression and suicidal thoughts before his discharge to his discharge destination,  During the course of his hospitalization, improvement was monitored by  observation and Larz's daily report of symptom reduction, presentation of good affect, and overall improvement in mood & behavior.  Patient tolerated his treatment regimen without any adverse effects reported.  Hari's case was presented during treatment team meeting this morning. The team members were all in agreement that Dalbert was both mentally & medically stable to be discharged to continue mental health care on an outpatient basis as noted below. He was provided with all the necessary information needed to make this appointment without problems.  Upon discharge ,Woodfin denied any SI/HI, AVH, delusional thoughts, or paranoia. He denied any substance withdrawal symptoms. He was provided with prescriptions of his [facility] discharge medications to be taken to his pharmacy as well a a two week supply  of samples. He left [facility] with all  personal belongings in no apparent distress. Transportation per taxi.  Physical Findings: AIMS: Facial and Oral Movements Muscles of Facial Expression: None, normal Lips and Perioral Area: None, normal Jaw: None, normal Tongue: None, normal,Extremity Movements Upper (arms, wrists, hands, fingers): None, normal Lower (legs, knees, ankles, toes): None, normal, Trunk Movements Neck, shoulders, hips: None, normal, Overall Severity Severity of abnormal movements (highest score from questions above): None, normal Incapacitation due to abnormal movements: None, normal Patient's awareness of abnormal movements (rate only patient's report): No Awareness, Dental Status Current problems with teeth and/or dentures?: No Does patient usually wear dentures?: No  CIWA:  CIWA-Ar Total: 5 COWS:  COWS Total Score: 4  Musculoskeletal: Strength & Muscle Tone: within normal limits Gait & Station: normal Patient leans: N/A  Psychiatric Specialty Exam: SEE SRA BY MD Physical Exam  Nursing note and vitals reviewed. Constitutional: He is oriented to person, place, and time.  Neurological: He is alert and oriented to person, place, and time.    Review of Systems  Psychiatric/Behavioral: Positive for substance abuse (Hx of substance abuse ). Negative for hallucinations, memory loss and suicidal ideas. Depression: improved. Nervous/anxious: improved. Insomnia: improved.   All other systems reviewed and are negative.   Blood pressure 107/68, pulse 87, temperature 97.6 F (36.4 C), temperature source Oral, resp. rate 16, height 5\' 4"  (1.626 m), weight 173 lb (78.5 kg).Body mass index is 29.7 kg/m.    Have you used any form of tobacco in the last 30 days? (Cigarettes, Smokeless Tobacco, Cigars, and/or Pipes): Yes  Has this patient used any form of tobacco in the last 30 days? (Cigarettes, Smokeless Tobacco, Cigars, and/or Pipes)  Yes, A prescription for an FDA-approved tobacco cessation medication was  offered at discharge and the patient refused  Blood Alcohol level:  Lab Results  Component Value Date   ETH 225 (H) 12/25/2016   ETH (H) 02/21/2010    133        LOWEST DETECTABLE LIMIT FOR SERUM ALCOHOL IS 5 mg/dL FOR MEDICAL PURPOSES ONLY    Metabolic Disorder Labs:  No results found for: HGBA1C, MPG No results found for: PROLACTIN No results found for: CHOL, TRIG, HDL, CHOLHDL, VLDL, LDLCALC  See Psychiatric Specialty Exam and Suicide Risk Assessment completed by Attending Physician prior to discharge.  Discharge destination:  [facility]  Is patient on multiple antipsychotic therapies at discharge:  No   Has Patient had three or more failed trials of antipsychotic monotherapy by history:  No  Recommended Plan for Multiple Antipsychotic Therapies: NA   Allergies as of 12/29/2016      Reactions   Ibuprofen Other (See Comments)   Reaction:  Bothers pts esophageal ulcers    Vicodin [  hydrocodone-acetaminophen] Other (See Comments)   Reaction:  Bothers pts esophageal ulcers       Medication List    STOP taking these medications   albuterol 108 (90 Base) MCG/ACT inhaler Commonly known as:  PROVENTIL HFA;VENTOLIN HFA   diphenhydrAMINE 25 MG tablet Commonly known as:  BENADRYL     TAKE these medications     Indication  cephALEXin 500 MG capsule Commonly known as:  KEFLEX Take 1 capsule (500 mg total) by mouth every 8 (eight) hours.  Indication:  bacterial infection   escitalopram 5 MG tablet Commonly known as:  LEXAPRO Take 1 tablet (5 mg total) by mouth daily.  Indication:  Major Depressive Disorder   gabapentin 400 MG capsule Commonly known as:  NEURONTIN Take 1 capsule (400 mg total) by mouth 3 (three) times daily.  Indication:  anxiety/agitation   hydrOXYzine 25 MG tablet Commonly known as:  ATARAX/VISTARIL Take 1 tablet (25 mg total) by mouth every 6 (six) hours as needed for anxiety.  Indication:  anxiety   methocarbamol 750 MG  tablet Commonly known as:  ROBAXIN Take 1 tablet (750 mg total) by mouth every 8 (eight) hours as needed for muscle spasms.  Indication:  muscle spasms   pantoprazole 40 MG tablet Commonly known as:  PROTONIX Take 1 tablet (40 mg total) by mouth 2 (two) times daily before a meal. What changed:  when to take this  Indication:  Gastroesophageal Reflux Disease, Heartburn   QUEtiapine 50 MG tablet Commonly known as:  SEROQUEL Take 1 tablet (50 mg total) by mouth at bedtime.  Indication:  psychotic features   QUEtiapine 25 MG tablet Commonly known as:  SEROQUEL Take 1 tablet (25 mg total) by mouth daily.  Indication:  psychotic features   traZODone 100 MG tablet Commonly known as:  DESYREL Take 1 tablet (100 mg total) by mouth at bedtime as needed for sleep.  Indication:  Trouble Sleeping      Follow-up Information    [facility] Follow up.   Specialty:  Behavioral Health Why:  Walk in within 7 days of hospital/rehab discharge to be assessed for mental health services including: Medication management; counseling; support groups. Walk in hours: Mon-Fri 8am-9am. Thank you.  Contact information: [address] [place] [address] [phone]        [facility], [facility] Follow up on 12/30/2016.   Why:  referral needed.  Contact information: [address] [place] [address] [phone]        [facility] Follow up.   Specialty:  Addiction Medicine Why:  Referral faxed: 12/27/16. If you are still interested in this facility, please contact Shayla in admissions daily to check status/waitlist.  Contact information: [address] [place] [address] [phone]           Follow-up recommendations:  Follow up with your outpatient provided for any medical issues. Activity & diet as recommended by your primary care provider.  Comments:  Patient is instructed prior to discharge to: Take all medications as prescribed by  his/her mental healthcare provider. Report any adverse effects and or reactions from the medicines to his/her outpatient provider promptly. Patient has been instructed & cautioned: To not engage in alcohol and or illegal drug use while on prescription medicines. In the event of worsening symptoms, patient is instructed to call the crisis hotline, 911 and or go to the nearest ED for appropriate evaluation and treatment of symptoms. To follow-up with his/her primary care  provider for your other medical issues, concerns and or health care needs.  Signed: Denzil Magnuson, NP 12/29/2016, 2:07 PM

## 2016-12-29 NOTE — Progress Notes (Signed)
Recreation Therapy Notes  Date: 12/29/2016 Time: 9:30am Location: [address] Dayroom  Group Topic: Stress Management  Goal Area(s) Addresses:  Patient will verbalize importance of using healthy stress management.  Patient will identify positive emotions associated with healthy stress management.   Intervention: Stress Management  Activity : Guided Energy Starter. Recreation Therapy Intern introduced the stress management technique of a guided energy starter. Recreation Therapy Intern read a script that allowed patients to work on stretching and relaxing some muscles to help them feel energized. Recreation Therapy Intern played calming music. Patients were to follow along as script was read to engage in the activity.  Education: Stress Management, Discharge Planning.   Education Outcome: Needs additional edcuation  Clinical Observations/Feedback: Pt did not attend group.  Rachel Meyer, Recreation Therapy Intern   Mirta Mally, LRT/CTRS  

## 2016-12-29 NOTE — [facility] Suicide Risk Assessment (Signed)
[facility]   Principal Problem: Substance or medication-induced bipolar and related disorder with onset during intoxication [facility](HCC) Discharge Diagnoses:  Patient Active Problem List   Diagnosis Date Noted  . Opioid use disorder, severe, dependence (HCC) [F11.20] 12/29/2016  . Cocaine use disorder, mild, abuse [F14.10] 12/29/2016  . 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 GERD (gastroesophageal reflux disease) [K21.9] 12/29/2016  . Suicidal ideation [R45.851] 12/26/2016  . Substance induced mood disorder (HCC) [F19.94] 12/26/2016  . Alcohol abuse with physiological dependence (HCC) [F10.20] 12/26/2016  . Hepatitis C [B19.20]   . Hematemesis/vomiting blood [K92.0] 08/02/2016  . Abdominal pain [R10.9] 08/02/2016  . Tobacco abuse [Z72.0] 08/02/2016  . Asthma [J45.909] 08/02/2016  . 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 Abnormal transaminases [R74.8]   . Erosive esophagitis [K22.10]   . Erosive gastritis with hemorrhage [K29.61]     Total Time spent with patient: 30 minutes  Musculoskeletal: Strength & Muscle Tone: within normal limits Gait & Station: normal Patient leans: N/A  Psychiatric Specialty Exam: Review of Systems  Psychiatric/Behavioral: Positive for substance abuse. Negative for depression and suicidal ideas.  All other systems reviewed and are negative.   Blood pressure 107/68, pulse 87, temperature 97.6 F (36.4 C), temperature source Oral, resp. rate 16, height 5\' 4"  (1.626 m), weight 78.5 kg (173 lb).Body mass index is 29.7 kg/m.  General Appearance: Casual  Eye Contact::  Fair  Speech:  Clear and Coherent409  Volume:  Normal  Mood:  Euthymic  Affect:  Appropriate  Thought Process:  Goal Directed and Descriptions of Associations: Intact  Orientation:  Full (Time, Place, and Person)  Thought Content:  Logical  Suicidal  Thoughts:  No  Homicidal Thoughts:  No  Memory:  Immediate;   Fair Recent;   Fair Remote;   Fair  Judgement:  Fair  Insight:  Fair  Psychomotor Activity:  Normal  Concentration:  Fair  Recall:  FiservFair  Fund of Knowledge:Fair  Language: Fair  Akathisia:  No  Handed:  Right  AIMS (if indicated):     Assets:  Communication Skills Desire for Improvement  Sleep:  Number of Hours: 6.5  Cognition: WNL  ADL's:  Intact   Mental Status Per Nursing Assessment::   On Admission:     Demographic Factors:  Male and Caucasian  Loss Factors: NA  Historical Factors: Impulsivity  Risk Reduction Factors:   Positive social support  Continued Clinical Symptoms:  Alcohol/Substance Abuse/Dependencies Previous Psychiatric Diagnoses and Treatments  Cognitive Features That Contribute To Risk:  None    Suicide Risk:  Minimal: No identifiable suicidal ideation.  Patients presenting with no risk factors but with morbid ruminations; may be classified as minimal risk based on the severity of the depressive symptoms  Follow-up Information    [facility] Follow up.   Specialty:  Behavioral Health Why:  Walk in within 7 days of hospital/rehab discharge to be assessed for mental health services including: Medication management; counseling; support groups. Walk in hours: Mon-Fri 8am-9am. Thank you.  Contact information: [address] [place] [address] [phone]        [facility] Follow up on 12/30/2016.   Why:  referral needed.  Contact information: [address] [place] [address] [phone]        [facility] Follow up.   Specialty:  Addiction Medicine Why:  Referral faxed: 12/27/16. If you are still  interested in this facility, please contact Shayla in admissions daily to check status/waitlist.  Contact information: [address] [place] [address] [phone]           Plan Of Care/Follow-up recommendations:   Activity:  no restrictions Diet:  regular Tests:  as needed Other:  none  Lavert Matousek, MD 12/29/2016, 5:12 PM

## 2016-12-29 NOTE — Progress Notes (Addendum)
[facility] MD Progress Note  12/29/2016 12:08 PM Chris Randall  MRN:  [id number] Subjective: Patient states " I am just nervous about how the other place is going to be.'    Objective: Patient seen and chart reviewed.Discussed patient with treatment team.  Pt today seen as less anxious , reports he is [facility] . [facility]. Pt agrees he has a problem with opioid abuse , he states that as soon as he wakes up he would crave it. He has been trying to cope with it. He also feels he may have true bipolar do, since it runs in his family. He reports mood lability, SI as well as sleep issues , which has been improving. He states that he slept better for the first time last night. He wants Clinical research associate to increase his Protonix dosage to 40 mg BID , since he reports a hx of GERD /PUD and states he was prescribed that by his GI provider. Per RN , pt continues to make progress.    Principal Problem: Substance or medication-induced bipolar and related disorder with onset during intoxication [facility]) Diagnosis:   Patient Active Problem List   Diagnosis Date Noted  . Opioid use disorder, severe, dependence (HCC) [F11.20] 12/29/2016  . Cocaine use disorder, mild, abuse [F14.10] 12/29/2016  . 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 GERD (gastroesophageal reflux disease) [K21.9] 12/29/2016  . Suicidal ideation [R45.851] 12/26/2016  . Substance induced mood disorder (HCC) [F19.94] 12/26/2016  . Alcohol abuse with physiological dependence (HCC) [F10.20] 12/26/2016  . Hepatitis C [B19.20]   . Hematemesis/vomiting blood [K92.0] 08/02/2016  . Abdominal pain [R10.9] 08/02/2016  . Tobacco abuse [Z72.0] 08/02/2016  . Asthma [J45.909] 08/02/2016  . 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 Abnormal transaminases [R74.8]   . Erosive esophagitis [K22.10]    . Erosive gastritis with hemorrhage [K29.61]    Total Time spent with patient: 20 minutes  Past Psychiatric History: see H&P  Past Medical History:  Past Medical History:  Diagnosis Date  . Asthma   . Bilateral varicoceles 03/2015   also left hydrocele per ultrasound.   . Bronchitis   . Esophageal varices (HCC) 2003 vs 2008   pt reprted varices on EGD in [place] "10 to 15 years ago. No varices or portal gastropathy on EGD 08/02/16  . Hematemesis 08/02/2016   Hx GERD, findings on 08/02/16 EGD: esophagitis LA grade B, ?underlying Barrett's esphagus; 4 cm HH; hemorrhagic gastritis and red blood in stomach (source or bleeding); NO VARICES OR PORTALGASTROPATHY.   . Hepatitis C   . Substance or medication-induced bipolar and related disorder with onset during intoxication (HCC) 12/29/2016   Opioid, cocaine, alcohol    Past Surgical History:  Procedure Laterality Date  . ESOPHAGOGASTRODUODENOSCOPY (EGD) WITH PROPOFOL N/A 08/02/2016   Procedure: ESOPHAGOGASTRODUODENOSCOPY (EGD) WITH PROPOFOL;  Surgeon: Iva Boop, MD;  Location: [facility] ENDOSCOPY;  Service: Endoscopy;  Laterality: N/A;  . ROTATOR CUFF REPAIR  1995  . SHOULDER SURGERY     Family History:  Family History  Problem Relation Age of Onset  . Hypertension Other   . Cancer Other    Family Psychiatric  History: see H&P Social History:  History  Alcohol Use  . Yes    Comment: drank 1/2 gallon yesterday - rarely drinks r/t stomach ulcers usually     History  Drug Use  .  Frequency: 7.0 times per week  . Types: "Crack" cocaine, Cocaine, Hydrocodone, Oxycodone    Social History   Social History  . Marital status: Divorced    Spouse name: N/A  . Number of children: N/A  . Years of education: N/A   Social History Main Topics  . Smoking status: Current Every Day Smoker    Packs/day: 1.00    Years: 15.00    Types: Cigarettes  . Smokeless tobacco: Current User    Types: Chew  . Alcohol use Yes     Comment: drank 1/2  gallon yesterday - rarely drinks r/t stomach ulcers usually  . Drug use: Yes    Frequency: 7.0 times per week    Types: "Crack" cocaine, Cocaine, Hydrocodone, Oxycodone  . Sexual activity: Yes   Other Topics Concern  . None   Social History Narrative  . None   Additional Social History:    History of alcohol / drug use?: Yes Negative Consequences of Use: Work / Programmer, multimedia, Copywriter, advertising relationships, Surveyor, quantity Withdrawal Symptoms: Agitation, Cramps, Fever / Chills, Irritability, Sweats, Tremors                    Sleep: Fair  Appetite:  Fair  Current Medications: Current Facility-Administered Medications  Medication Dose Route Frequency Provider Last Rate Last Dose  . alum & mag hydroxide-simeth (MAALOX/MYLANTA) 200-200-20 MG/5ML suspension 30 mL  30 mL Oral Q4H PRN Oneta Rack, NP   30 mL at 12/28/16 2154  . cephALEXin (KEFLEX) capsule 500 mg  500 mg Oral Q8H Withrow, John C, FNP   500 mg at 12/29/16 1610  . cloNIDine (CATAPRES) tablet 0.1 mg  0.1 mg Oral Esperanza Heir, NP   0.1 mg at 12/29/16 0816   Followed by  . [START ON 12/31/2016] cloNIDine (CATAPRES) tablet 0.1 mg  0.1 mg Oral QAC breakfast Oneta Rack, NP      . diclofenac sodium (VOLTAREN) 1 % transdermal gel 2 g  2 g Topical TID PRN Nira Conn A, NP   2 g at 12/26/16 2112  . dicyclomine (BENTYL) tablet 20 mg  20 mg Oral Q6H PRN Oneta Rack, NP   20 mg at 12/28/16 0810  . escitalopram (LEXAPRO) tablet 5 mg  5 mg Oral Daily Beau Fanny, FNP   5 mg at 12/29/16 0816  . gabapentin (NEURONTIN) capsule 400 mg  400 mg Oral TID Georgiann Cocker, MD   400 mg at 12/29/16 1158  . hydrOXYzine (ATARAX/VISTARIL) tablet 25 mg  25 mg Oral Q6H PRN Oneta Rack, NP   25 mg at 12/28/16 2155  . loperamide (IMODIUM) capsule 2-4 mg  2-4 mg Oral PRN Oneta Rack, NP      . magnesium hydroxide (MILK OF MAGNESIA) suspension 30 mL  30 mL Oral Daily PRN Oneta Rack, NP      . methocarbamol (ROBAXIN)  tablet 750 mg  750 mg Oral Q8H PRN Beau Fanny, FNP   750 mg at 12/29/16 0820  . nicotine (NICODERM CQ - dosed in mg/24 hours) patch 21 mg  21 mg Transdermal Daily Oneta Rack, NP   21 mg at 12/29/16 0815  . ondansetron (ZOFRAN-ODT) disintegrating tablet 4 mg  4 mg Oral Q6H PRN Oneta Rack, NP      . pantoprazole (PROTONIX) 40 MG EC tablet           . pantoprazole (PROTONIX) EC tablet 40 mg  40 mg Oral  BID AC Jomarie LongsEappen, Isayah Ignasiak, MD   40 mg at 12/29/16 1201  . QUEtiapine (SEROQUEL) tablet 25 mg  25 mg Oral Daily Izediuno, Delight OvensVincent A, MD   25 mg at 12/29/16 0817  . QUEtiapine (SEROQUEL) tablet 50 mg  50 mg Oral QHS Izediuno, Delight OvensVincent A, MD   50 mg at 12/28/16 2155  . traZODone (DESYREL) tablet 100 mg  100 mg Oral QHS PRN Beau FannyWithrow, John C, FNP   100 mg at 12/28/16 2155    Lab Results: No results found for this or any previous visit (from the past 48 hour(s)).  Blood Alcohol level:  Lab Results  Component Value Date   ETH 225 (H) 12/25/2016   ETH (H) 02/21/2010    133        LOWEST DETECTABLE LIMIT FOR SERUM ALCOHOL IS 5 mg/dL FOR MEDICAL PURPOSES ONLY    Metabolic Disorder Labs: No results found for: HGBA1C, MPG No results found for: PROLACTIN No results found for: CHOL, TRIG, HDL, CHOLHDL, VLDL, LDLCALC  Physical Findings: AIMS: Facial and Oral Movements Muscles of Facial Expression: None, normal Lips and Perioral Area: None, normal Jaw: None, normal Tongue: None, normal,Extremity Movements Upper (arms, wrists, hands, fingers): None, normal Lower (legs, knees, ankles, toes): None, normal, Trunk Movements Neck, shoulders, hips: None, normal, Overall Severity Severity of abnormal movements (highest score from questions above): None, normal Incapacitation due to abnormal movements: None, normal Patient's awareness of abnormal movements (rate only patient's report): No Awareness, Dental Status Current problems with teeth and/or dentures?: No Does patient usually wear  dentures?: No  CIWA:  CIWA-Ar Total: 5 COWS:  COWS Total Score: 4  Musculoskeletal: Strength & Muscle Tone: within normal limits Gait & Station: normal Patient leans: N/A  Psychiatric Specialty Exam: Physical Exam  Nursing note and vitals reviewed.   Review of Systems  Psychiatric/Behavioral: Positive for substance abuse. Negative for hallucinations and suicidal ideas.  All other systems reviewed and are negative.   Blood pressure 107/68, pulse 87, temperature 97.6 F (36.4 C), temperature source Oral, resp. rate 16, height 5\' 4"  (1.626 m), weight 78.5 kg (173 lb).Body mass index is 29.7 kg/m.  General Appearance: Casual and Fairly Groomed  Eye Contact:  Fair  Speech:  Normal Rate  Volume:  Normal  Mood:  Anxious  Affect:  Congruent  Thought Process:  Linear and Descriptions of Associations: Intact  Orientation:  Full (Time, Place, and Person)  Thought Content: DENIES any rumination  Suicidal Thoughts:  No  Homicidal Thoughts:  No  Memory:  Immediate;   Fair Recent;   Fair Remote;   Fair  Judgement:  Fair  Insight:  Fair  Psychomotor Activity:  Normal  Concentration:  Concentration: Fair and Attention Span: Fair  Recall:  FiservFair  Fund of Knowledge:  Fair  Language:  Fair  Akathisia:  No  Handed:    AIMS (if indicated):     Assets:  Manufacturing systems engineerCommunication Skills Social Support  ADL's:  Intact  Cognition:  WNL  Sleep:  Number of Hours: 6.5   Treatment Plan Summary:Patient is making progress , he has insight in to his substance abuse and is motivated to get help. [facility]   Substance or medication-induced bipolar and related disorder with onset during intoxication (HCC)  Medications: Will continue today 12/29/16 plan as below except where it is noted.  -Increase Robaxin to 750mg  po q8h prn spasm -Increase Trazodone to 100mg  po qhs prn insomnia -Continue clonidine/COWS protocol -Lexapro 5mg   po daily for depression -Continue  Gabapentin 400mg  po tid  -Restart Protonix 40 mg po bid with prior to meals for GERD. -Continue Seroquel 25 po daily and 50mg  po qhs for psychotic features (improving)  Mylee Falin, MD 12/29/2016, 12:08 PM

## 2016-12-29 NOTE — Plan of Care (Signed)
Problem: Safety: Goal: Periods of time without injury will increase Outcome: Progressing Pt. remains a high fall risk, denies SI/HI/AVH at this time, Q 15 checks in effect.    

## 2016-12-29 NOTE — Progress Notes (Signed)
D:Pt has a flat/blunted affect and c/o withdrawal symptoms. He is restless asking about about his medications. A:Offered support, encouragement, education on medications and 15 minute checks.  R:Pt denies si and hi. Safety maintained on the unit.

## 2016-12-29 NOTE — Progress Notes (Signed)
  DATA ACTION RESPONSE  Objective- Pt. is visible in the dayroom, seen interacting with peers. Presents with a flat/anxious   affect and mood. Brightens on approach. C/o of withdrawal s/s.  Subjective- Denies having any SI/HI/AVH/Pain at this time. Pt. states "I'm just worried about my withdrawal medication [facility]Is cooperative and remain safe on the unit.  1:1 interaction in private to establish rapport. Encouragement, education, & support given from staff.  PRN Vistaril, Robaxin, and Trazodone requested and will re-eval accordingly.   Safety maintained with Q 15 checks. Continue with POC.

## 2016-12-30 NOTE — Progress Notes (Addendum)
Discharge- Patient verbalizes readiness for discharge: Denies SI/HI, is not psychotic or delusional. A- Discharge instruction read and discussed with parents and patients. All belongings returned to patient.  R- Parent and patient verbalize understanding of discharge instructions. Signed for return of belongings. Escorted to the lobby. Breakfast tray given. Taxi called.

## 2017-01-21 ENCOUNTER — Encounter (HOSPITAL_BASED_OUTPATIENT_CLINIC_OR_DEPARTMENT_OTHER): Payer: Self-pay | Admitting: *Deleted

## 2017-01-21 ENCOUNTER — Emergency Department (HOSPITAL_BASED_OUTPATIENT_CLINIC_OR_DEPARTMENT_OTHER)
Admission: EM | Admit: 2017-01-21 | Discharge: 2017-01-21 | Disposition: A | Discharge status: Home / Self Care | Payer: Self-pay | Attending: Emergency Medicine | Admitting: Emergency Medicine

## 2017-01-21 DIAGNOSIS — Z79899 Other long term (current) drug therapy: Secondary | ICD-10-CM | POA: Insufficient documentation

## 2017-01-21 DIAGNOSIS — R1010 Upper abdominal pain, unspecified: Secondary | ICD-10-CM | POA: Insufficient documentation

## 2017-01-21 DIAGNOSIS — J45909 Unspecified asthma, uncomplicated: Secondary | ICD-10-CM | POA: Insufficient documentation

## 2017-01-21 DIAGNOSIS — R1013 Epigastric pain: Secondary | ICD-10-CM | POA: Insufficient documentation

## 2017-01-21 DIAGNOSIS — F17210 Nicotine dependence, cigarettes, uncomplicated: Secondary | ICD-10-CM | POA: Insufficient documentation

## 2017-01-21 LAB — COMPREHENSIVE METABOLIC PANEL
ALBUMIN: 4 g/dL (ref 3.5–5.0)
ALK PHOS: 106 U/L (ref 38–126)
ALT: 109 U/L — ABNORMAL HIGH (ref 17–63)
ANION GAP: 7 (ref 5–15)
AST: 59 U/L — ABNORMAL HIGH (ref 15–41)
BILIRUBIN TOTAL: 0.1 mg/dL — AB (ref 0.3–1.2)
BUN: 10 mg/dL (ref 6–20)
CALCIUM: 8.9 mg/dL (ref 8.9–10.3)
CO2: 27 mmol/L (ref 22–32)
Chloride: 102 mmol/L (ref 101–111)
Creatinine, Ser: 0.94 mg/dL (ref 0.61–1.24)
GFR calc Af Amer: >60 mL/min (ref >60)
GLUCOSE: 135 mg/dL — AB (ref 65–99)
POTASSIUM: 3.8 mmol/L (ref 3.5–5.1)
Sodium: 136 mmol/L (ref 135–145)
TOTAL PROTEIN: 7 g/dL (ref 6.5–8.1)

## 2017-01-21 LAB — CBC
HEMATOCRIT: 45.7 % (ref 39.0–52.0)
HEMOGLOBIN: 15.4 g/dL (ref 13.0–17.0)
MCH: 28.2 pg (ref 26.0–34.0)
MCHC: 33.7 g/dL (ref 30.0–36.0)
MCV: 83.7 fL (ref 78.0–100.0)
Platelets: 242 10*3/uL (ref 150–400)
RBC: 5.46 MIL/uL (ref 4.22–5.81)
RDW: 13 % (ref 11.5–15.5)
WBC: 8.2 10*3/uL (ref 4.0–10.5)

## 2017-01-21 LAB — URINALYSIS, ROUTINE W REFLEX MICROSCOPIC
Bilirubin Urine: NEGATIVE
Glucose, UA: NEGATIVE mg/dL
Ketones, ur: NEGATIVE mg/dL
Leukocytes, UA: NEGATIVE
Nitrite: NEGATIVE
Protein, ur: NEGATIVE mg/dL
Specific Gravity, Urine: 1.01 (ref 1.005–1.030)
pH: 6.5 (ref 5.0–8.0)

## 2017-01-21 LAB — URINALYSIS, MICROSCOPIC (REFLEX)

## 2017-01-21 LAB — OCCULT BLOOD X 1 CARD TO LAB, STOOL: Fecal Occult Bld: POSITIVE — AB

## 2017-01-21 LAB — LIPASE, BLOOD: LIPASE: 28 U/L (ref 11–51)

## 2017-01-21 MED ORDER — GI COCKTAIL ~~LOC~~
30.0000 mL | Freq: Once | ORAL | Status: AC
  Administered 2017-01-21: 30 mL via ORAL
  Filled 2017-01-21: qty 30

## 2017-01-21 MED ORDER — SUCRALFATE 1 GM/10ML PO SUSP: 1.0000 g | Freq: Three times a day (TID) | ORAL | 0 refills | Status: DC

## 2017-01-21 NOTE — ED Triage Notes (Signed)
Pt is from [facility] , rectal bleeding x 2 days and abd pain x 1 week. HX ulcer

## 2017-01-21 NOTE — Discharge Instructions (Signed)
Please read and follow all provided instructions.  Your diagnoses today include:  1. Epigastric pain     Tests performed today include: Blood counts and electrolytes Blood tests to check liver and kidney function Blood tests to check pancreas function Urine test to look for infection and pregnancy (in women) Vital signs. See below for your results today.   Medications prescribed:   Take any prescribed medications only as directed.  Home care instructions:  Follow any educational materials contained in this packet.  Follow-up instructions: Please follow-up with your primary care provider in the next 2 days for further evaluation of your symptoms.    Return instructions:  SEEK IMMEDIATE MEDICAL ATTENTION IF: The pain does not go away or becomes severe  A temperature above 101F develops  Repeated vomiting occurs (multiple episodes)  The pain becomes localized to portions of the abdomen. The right side could possibly be appendicitis. In an adult, the left lower portion of the abdomen could be colitis or diverticulitis.  Blood is being passed in stools or vomit (bright red or black tarry stools)  You develop chest pain, difficulty breathing, dizziness or fainting, or become confused, poorly responsive, or inconsolable (young children) If you have any other emergent concerns regarding your health  Additional Information: Abdominal (belly) pain can be caused by many things. Your caregiver performed an examination and possibly ordered blood/urine tests and imaging (CT scan, x-rays, ultrasound). Many cases can be observed and treated at home after initial evaluation in the emergency department. Even though you are being discharged home, abdominal pain can be unpredictable. Therefore, you need a repeated exam if your pain does not resolve, returns, or worsens. Most patients with abdominal pain don't have to be admitted to the hospital or have surgery, but serious problems like appendicitis  and gallbladder attacks can start out as nonspecific pain. Many abdominal conditions cannot be diagnosed in one visit, so follow-up evaluations are very important.  Your vital signs today were: BP (!) 152/85    Pulse 94    Temp 98.6 F (37 C) (Oral)    Resp 20    Ht 5\' 4"  (1.626 m)    Wt 83.9 kg (185 lb)    SpO2 99%    BMI 31.76 kg/m  If your blood pressure (bp) was elevated above 135/85 this visit, please have this repeated by your doctor within one month. --------------

## 2017-01-21 NOTE — ED Provider Notes (Signed)
[facility] Provider Note   CSN: [id number] Arrival date & time: 01/21/17  1408     History   Chief Complaint Chief Complaint  Patient presents with  . Rectal Bleeding    HPI Chris Randall is a 41 y.o. male.  HPI  41 y.o. male, presents to the Emergency Department today due to rectal bleeding x 2 days as well as upper abdominal pain. Pt states that he has hx of stomach ulcers and has been noticing increased pain with PO intake since being at [facility]. Notes pain worse with PO intake. Improved with rest. Feels bloated. Rates pain 7/10. Brining/cramping sensation. Notes nausea without emesis. No diarrhea. No CP/SOB. No meds PTA. Pt is on PPI 20g BID currently. No fevers. No cough/congestion. Notes rectal bleeding is noticed during wiping. No melanotic stool. No lower abdominal pain. No other symptoms noted.    Past Medical History:  Diagnosis Date  . Asthma   . Bilateral varicoceles 03/2015   also left hydrocele per ultrasound.   . Bronchitis   . Esophageal varices (HCC) 2003 vs 2008   pt reprted varices on EGD in [place] "10 to 15 years ago. No varices or portal gastropathy on EGD 08/02/16  . Hematemesis 08/02/2016   Hx GERD, findings on 08/02/16 EGD: esophagitis LA grade B, ?underlying Barrett's esphagus; 4 cm HH; hemorrhagic gastritis and red blood in stomach (source or bleeding); NO VARICES OR PORTALGASTROPATHY.   . Hepatitis C   . Substance or medication-induced bipolar and related disorder with onset during intoxication (HCC) 12/29/2016   Opioid, cocaine, alcohol    Patient Active Problem List   Diagnosis Date Noted  . Opioid use disorder, severe, dependence (HCC) 12/29/2016  . Cocaine use disorder, mild, abuse 12/29/2016  . Substance or medication-induced bipolar and related disorder with onset during intoxication (HCC) 12/29/2016  . GERD (gastroesophageal reflux disease) 12/29/2016  . Suicidal ideation 12/26/2016  . Substance induced mood disorder  (HCC) 12/26/2016  . Alcohol abuse with physiological dependence (HCC) 12/26/2016  . Hepatitis C   . Hematemesis/vomiting blood 08/02/2016  . Abdominal pain 08/02/2016  . Tobacco abuse 08/02/2016  . Asthma 08/02/2016  . Esophageal varices determined by endoscopy (HCC) 08/02/2016  . Abnormal transaminases   . Erosive esophagitis   . Erosive gastritis with hemorrhage     Past Surgical History:  Procedure Laterality Date  . ESOPHAGOGASTRODUODENOSCOPY (EGD) WITH PROPOFOL N/A 08/02/2016   Procedure: ESOPHAGOGASTRODUODENOSCOPY (EGD) WITH PROPOFOL;  Surgeon: Iva Booparl E Gessner, MD;  Location: [facility] ENDOSCOPY;  Service: Endoscopy;  Laterality: N/A;  . ROTATOR CUFF REPAIR  1995  . SHOULDER SURGERY         Home Medications    Prior to Admission medications   Medication Sig Start Date End Date Taking? Authorizing Provider  escitalopram (LEXAPRO) 5 MG tablet Take 1 tablet (5 mg total) by mouth daily. 12/30/16   Denzil Magnusonhomas, Lashunda, NP  gabapentin (NEURONTIN) 400 MG capsule Take 1 capsule (400 mg total) by mouth 3 (three) times daily. 12/29/16   Denzil Magnusonhomas, Lashunda, NP  hydrOXYzine (ATARAX/VISTARIL) 25 MG tablet Take 1 tablet (25 mg total) by mouth every 6 (six) hours as needed for anxiety. 12/29/16   Denzil Magnusonhomas, Lashunda, NP  methocarbamol (ROBAXIN) 750 MG tablet Take 1 tablet (750 mg total) by mouth every 8 (eight) hours as needed for muscle spasms. 12/29/16   Denzil Magnusonhomas, Lashunda, NP  pantoprazole (PROTONIX) 40 MG tablet Take 1 tablet (40 mg total) by mouth 2 (two) times daily before a meal.  12/30/16   Denzil Magnuson, NP  QUEtiapine (SEROQUEL) 25 MG tablet Take 1 tablet (25 mg total) by mouth daily. 12/30/16   Denzil Magnuson, NP  QUEtiapine (SEROQUEL) 50 MG tablet Take 1 tablet (50 mg total) by mouth at bedtime. 12/29/16   Denzil Magnuson, NP  traZODone (DESYREL) 100 MG tablet Take 1 tablet (100 mg total) by mouth at bedtime as needed for sleep. 12/29/16   Denzil Magnuson, NP    Family History Family History  Problem  Relation Age of Onset  . Hypertension Other   . Cancer Other     Social History Social History  Substance Use Topics  . Smoking status: Current Every Day Smoker    Packs/day: 1.00    Years: 15.00    Types: Cigarettes  . Smokeless tobacco: Current User    Types: Chew  . Alcohol use Yes     Comment: drank 1/2 gallon yesterday - rarely drinks r/t stomach ulcers usually     Allergies   Ibuprofen and Vicodin [hydrocodone-acetaminophen]   Review of Systems Review of Systems ROS reviewed and all are negative for acute change except as noted in the HPI.  Physical Exam Updated Vital Signs BP (!) 152/85   Pulse 94   Temp 98.6 F (37 C) (Oral)   Resp 20   Ht 5\' 4"  (1.626 m)   Wt 83.9 kg (185 lb)   SpO2 99%   BMI 31.76 kg/m   Physical Exam  Constitutional: He is oriented to person, place, and time. He appears well-developed and well-nourished. No distress.  HENT:  Head: Normocephalic and atraumatic.  Right Ear: Tympanic membrane, external ear and ear canal normal.  Left Ear: Tympanic membrane, external ear and ear canal normal.  Nose: Nose normal.  Mouth/Throat: Uvula is midline, oropharynx is clear and moist and mucous membranes are normal. No trismus in the jaw. No oropharyngeal exudate, posterior oropharyngeal erythema or tonsillar abscesses.  Eyes: Pupils are equal, round, and reactive to light. EOM are normal.  Neck: Normal range of motion. Neck supple. No tracheal deviation present.  Cardiovascular: Normal rate, regular rhythm, S1 normal, S2 normal, normal heart sounds, intact distal pulses and normal pulses.   Pulmonary/Chest: Effort normal and breath sounds normal. No respiratory distress. He has no decreased breath sounds. He has no wheezes. He has no rhonchi. He has no rales.  Abdominal: Soft. Normal appearance and bowel sounds are normal. There is tenderness in the epigastric area. There is no rigidity, no rebound, no guarding, no CVA tenderness, no tenderness at  McBurney's point and negative Murphy's sign.  Genitourinary:  Genitourinary Comments: Chaperone was present. Patient with no pain around the rectal area. There are no external fissures noted. No induration of the skin or swelling. No external hemorrhoids seen. Patient able to tolerate examination. I was able to feel the first 2-3cm of the rectum digitally without gross abnormality. There is no gross blood.  No signs of perirectal abscess.  Musculoskeletal: Normal range of motion.  Neurological: He is alert and oriented to person, place, and time.  Skin: Skin is warm and dry.  Psychiatric: He has a normal mood and affect. His speech is normal and behavior is normal. Thought content normal.   ED Treatments / Results  Labs (all labs ordered are listed, but only abnormal results are displayed) Labs Reviewed  URINALYSIS, ROUTINE W REFLEX MICROSCOPIC - Abnormal; Notable for the following:       Result Value   Hgb urine dipstick LARGE (*)  All other components within normal limits  URINALYSIS, MICROSCOPIC (REFLEX) - Abnormal; Notable for the following:    Bacteria, UA RARE (*)    Squamous Epithelial / LPF 0-5 (*)    All other components within normal limits  COMPREHENSIVE METABOLIC PANEL - Abnormal; Notable for the following:    Glucose, Bld 135 (*)    AST 59 (*)    ALT 109 (*)    Total Bilirubin 0.1 (*)    All other components within normal limits  OCCULT BLOOD X 1 CARD TO LAB, STOOL - Abnormal; Notable for the following:    Fecal Occult Bld POSITIVE (*)    All other components within normal limits  CBC  LIPASE, BLOOD    EKG  EKG Interpretation None       Radiology No results found.  Procedures Procedures (including critical care time)  Medications Ordered in ED Medications  gi cocktail (Maalox,Lidocaine,Donnatal) (30 mLs Oral Given 01/21/17 1507)   Initial Impression / Assessment and Plan / ED Course  I have reviewed the triage vital signs and the nursing  notes.  Pertinent labs & imaging results that were available during my care of the patient were reviewed by me and considered in my medical decision making (see chart for details).  Final Clinical Impressions(s) / ED Diagnoses  {I have reviewed and evaluated the relevant laboratory values.   {I have reviewed the relevant previous healthcare records.  {I obtained HPI from historian.   ED Course:  Assessment: Patient is a 41 y.o. male presents to the Emergency Department today due to rectal bleeding x 2 days as well as upper abdominal pain. Pt states that he has hx of stomach ulcers and has been noticing increased pain with PO intake since being at [facility]. Notes pain worse with PO intake. Improved with rest. Feels bloated. Rates pain 7/10. Brining/cramping sensation. Notes nausea without emesis. No diarrhea. No CP/SOB. No meds PTA. Pt is on PPI 20g BID currently. No fevers. No cough/congestion. Notes rectal bleeding is noticed during wiping. No melanotic stool. No lower abdominal pain.  On exam, nontoxic, nonseptic appearing, in no apparent distress. Patient's pain and other symptoms adequately managed in emergency department. DRE exam without gross blood or melanotic stool. Labs and vitals reviewed. Occult stool positive. Given GI cocktail with relief. Patient does not meet the SIRS or Sepsis criteria.  On repeat exam patient does not have a surgical abdomen and there are no peritoneal signs.  No indication of appendicitis, bowel obstruction, bowel perforation, cholecystitis, diverticulitis. Suspect internal hemorrhoid vs ulcer given pt previous hx. Patient discharged home with symptomatic treatment and given strict instructions for follow-up with their primary care physician.  I have also discussed reasons to return immediately to the ER.  Patient expresses understanding and agrees with plan.  Disposition/Plan:  DC Home Additional Verbal discharge instructions given and discussed with patient.  Pt  Instructed to f/u with GI in the next week for evaluation and treatment of symptoms. Return precautions given Pt acknowledges and agrees with plan  Supervising Physician Raeford Razor, MD  Final diagnoses:  Epigastric pain    New Prescriptions New Prescriptions   No medications on file     Wilber Bihari 01/21/17 1547    Raeford Razor, MD 01/26/17 [phone]

## 2017-03-03 IMAGING — CT CT ABD-PELV W/ CM
2 of 4 series · 16 of 46 positions shown, 18 images · IV contrast (Omni 300)
Comparison: CT abdomen and pelvis May 06, 2013

CLINICAL DATA: LEFT flank pain, vomiting yesterday morning,
bilateral flank pain with bowel movement for 1 week. History of
peptic ulcer.

EXAM:
CT ABDOMEN AND PELVIS WITH CONTRAST
TECHNIQUE: Multidetector CT imaging of the abdomen and pelvis was performed
using the standard protocol following bolus administration of
intravenous contrast.
CONTRAST:  100mL OMNIPAQUE IOHEXOL 300 MG/ML  SOLN

[Series 2: abd/ pelvis 5.0 i30f 1 · axial · 0.75mm/px · z∈[+779,+1199]mm · 13 of 92 slices shown, 15 images]
[im 4/92  soft-tissue]
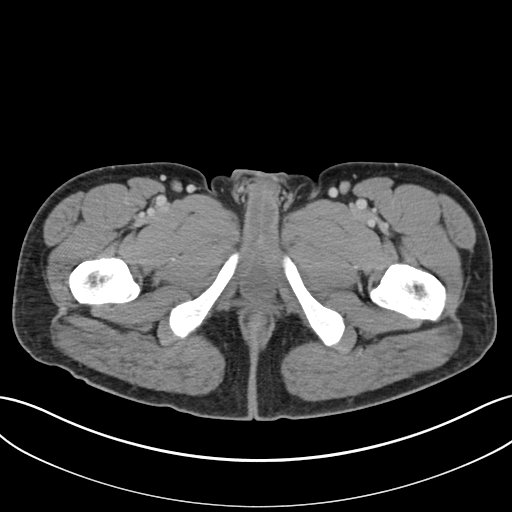
[im 4/92  bone]
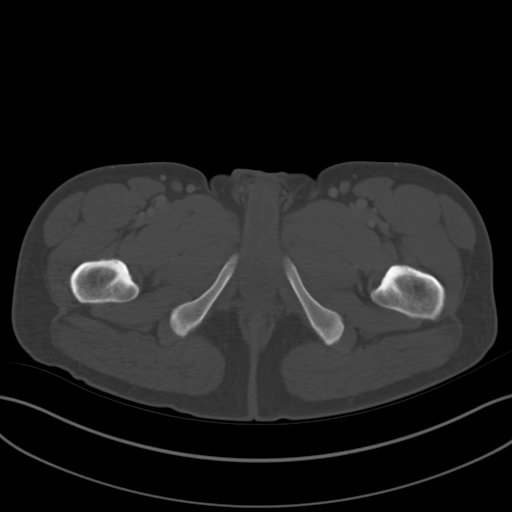
[im 11/92  soft-tissue]
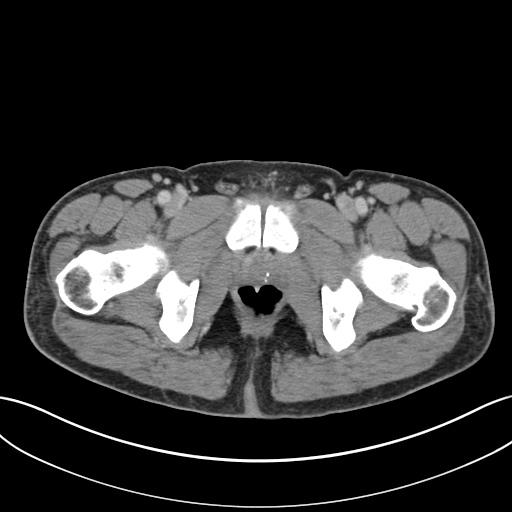
[im 19/92  soft-tissue]
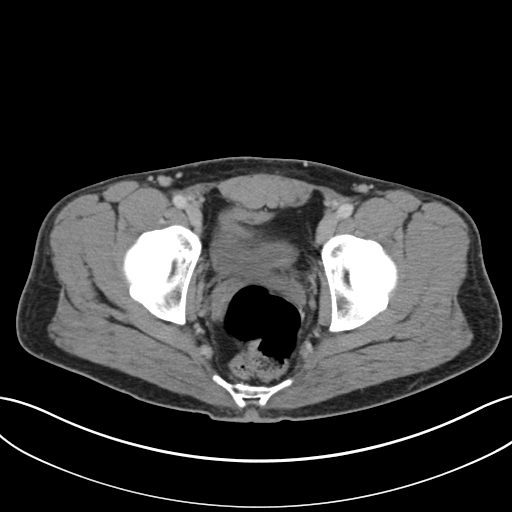
[im 26/92  soft-tissue]
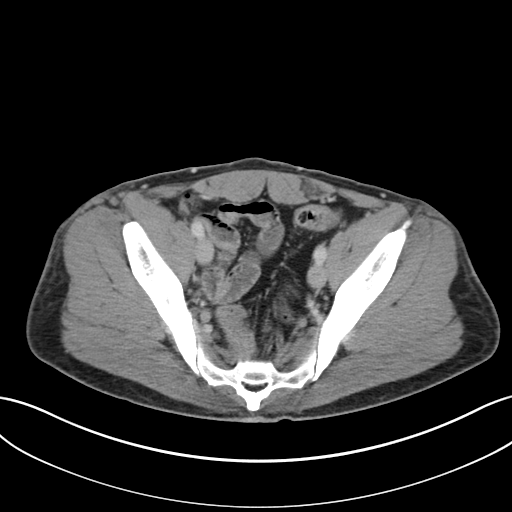
[im 33/92  soft-tissue]
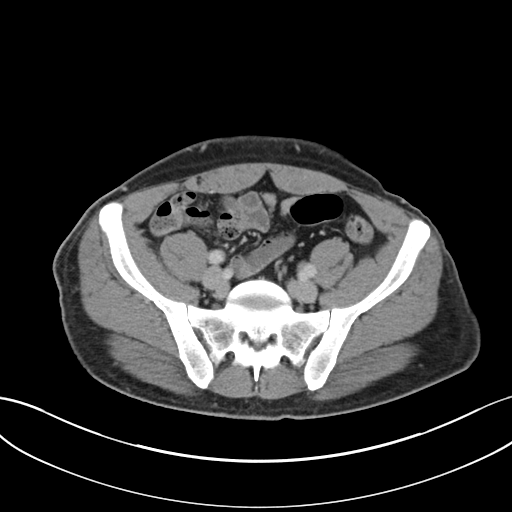
[im 41/92  soft-tissue]
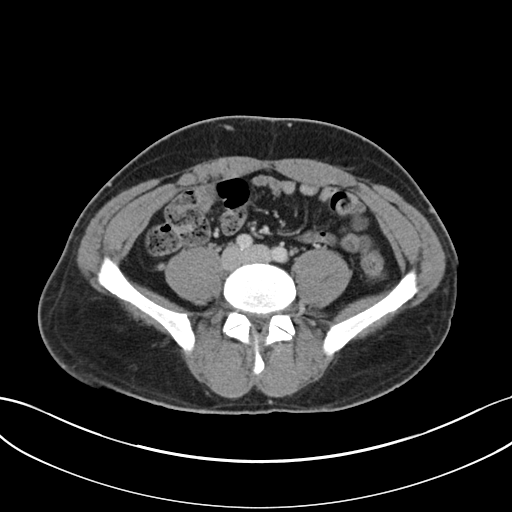
[im 48/92  soft-tissue]
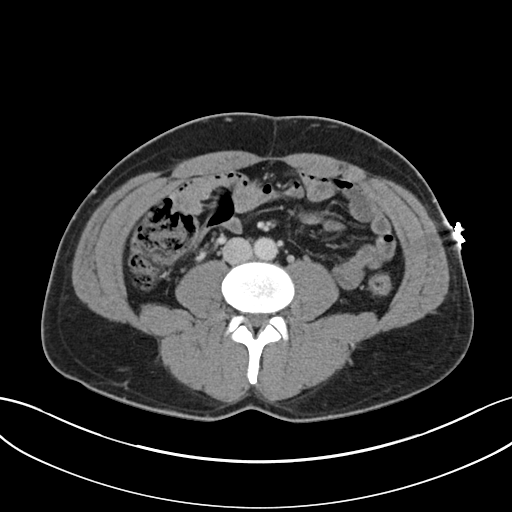
[im 51/92  soft-tissue]
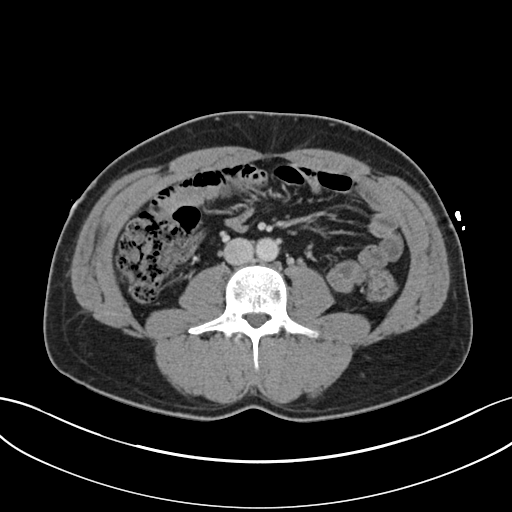
[im 59/92  soft-tissue]
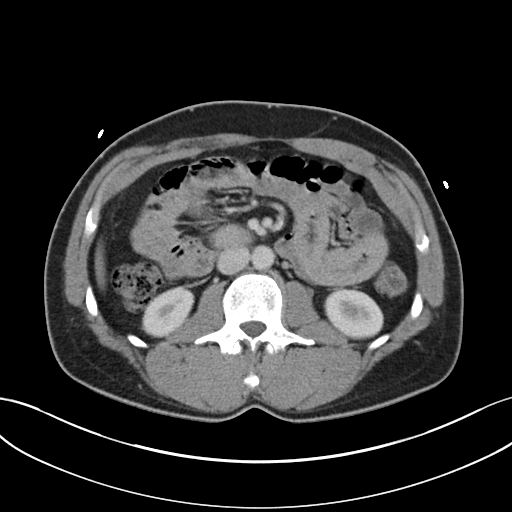
[im 59/92  bone]
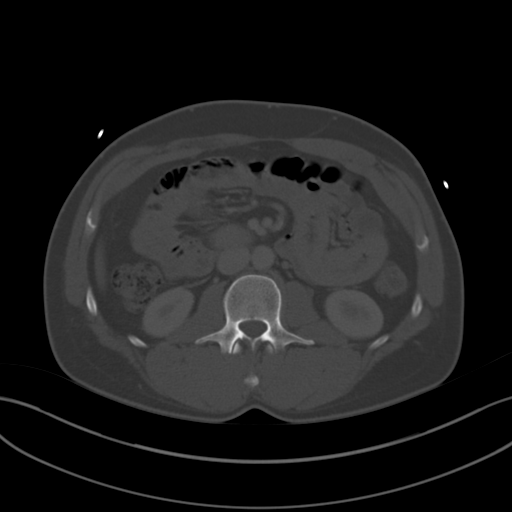
[im 66/92  soft-tissue]
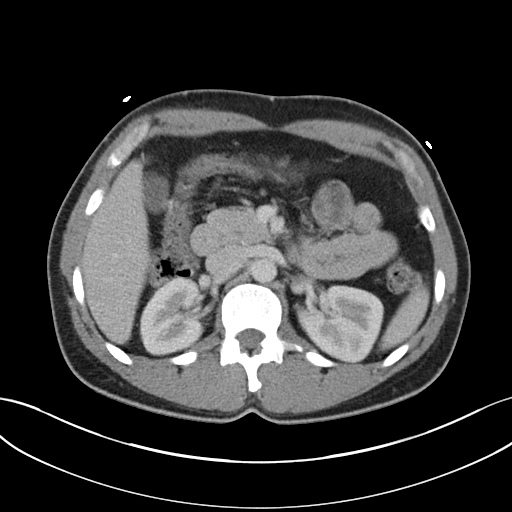
[im 73/92  soft-tissue]
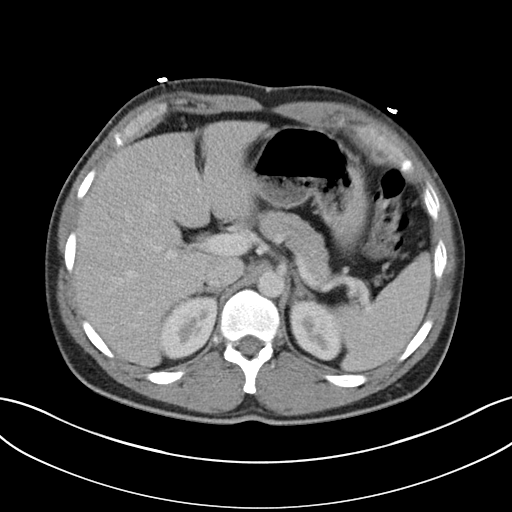
[im 81/92  soft-tissue]
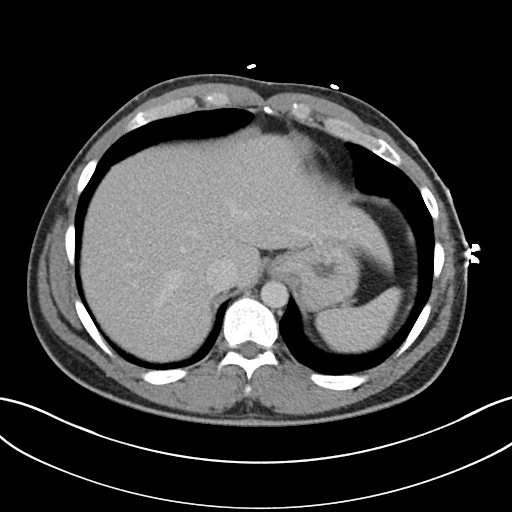
[im 88/92  soft-tissue]
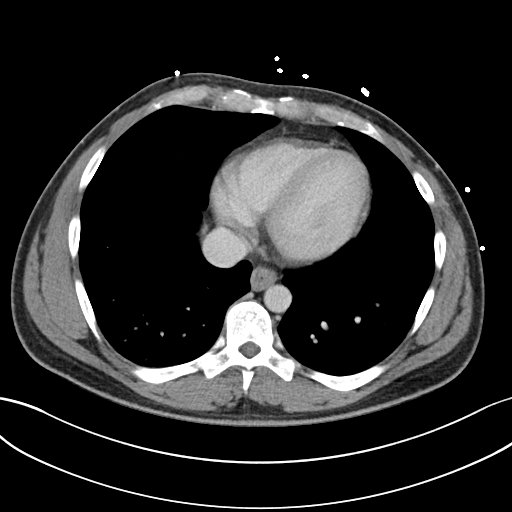

[Series 5: coronals · coronal · 0.70mm/px · 3 of 134 slices shown]
[im 45/134  soft-tissue]
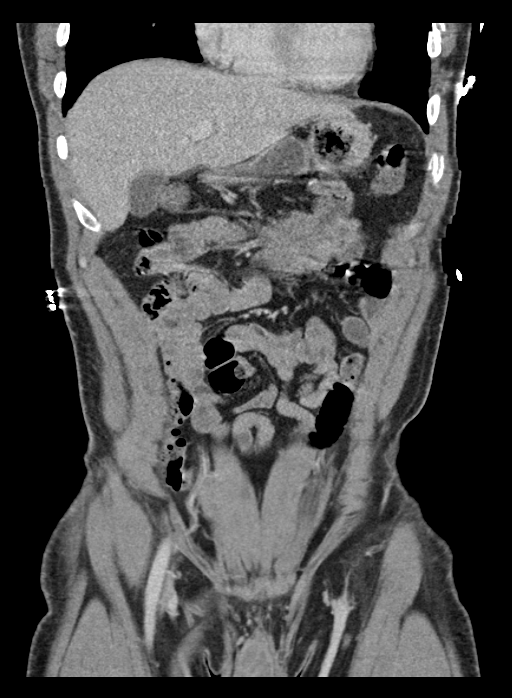
[im 60/134  soft-tissue]
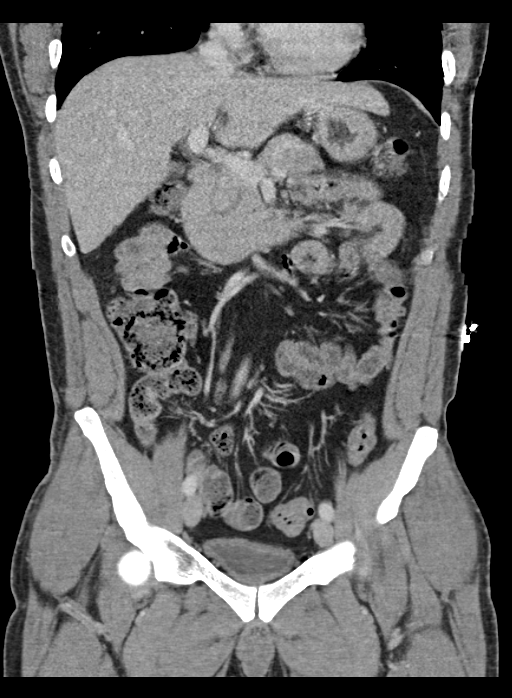
[im 74/134  soft-tissue]
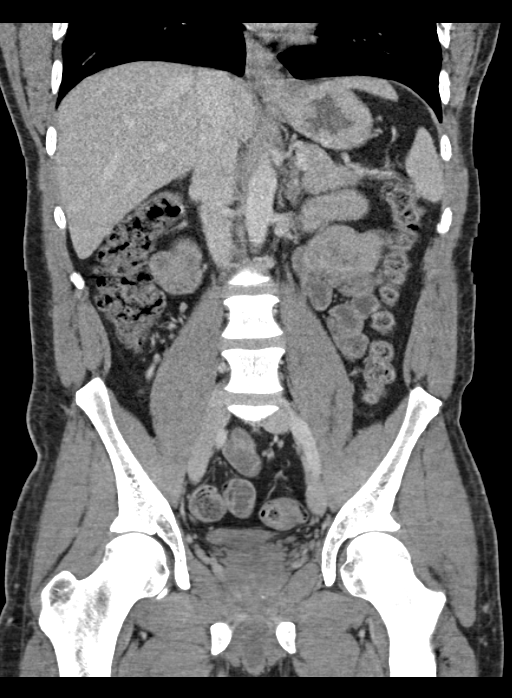

[16 of 46 positions shown; findings below may reference images not displayed]

FINDINGS: LUNG BASES: Included view of the lung bases are clear. Visualized
heart and pericardium are unremarkable.

SOLID ORGANS: The liver demonstrates focal fatty infiltration about
the falciform ligament and, is otherwise unremarkable. Spleen,
gallbladder, pancreas and adrenal glands are unremarkable.

GASTROINTESTINAL TRACT: Small hiatal hernia. The stomach, small and
large bowel are normal in course and caliber without inflammatory
changes. Mild colonic diverticulosis. Normal appendix.

KIDNEYS/ URINARY TRACT: Kidneys are orthotopic, demonstrating
symmetric enhancement. No nephrolithiasis, hydronephrosis or solid
renal masses. The unopacified ureters are normal in course and
caliber. Delayed imaging through the kidneys demonstrates symmetric
prompt contrast excretion within the proximal urinary collecting
system. Urinary bladder is partially distended and unremarkable.

PERITONEUM/RETROPERITONEUM: Aortoiliac vessels are normal in course
and caliber, mild calcific atherosclerosis. No lymphadenopathy by CT
size criteria. Prostate is unremarkable. No intraperitoneal free
fluid nor free air.

SOFT TISSUE/OSSEOUS STRUCTURES: Non-suspicious.
IMPRESSION: No acute intra-abdominal or pelvic process.

## 2021-08-08 ENCOUNTER — Other Ambulatory Visit: Payer: Self-pay

## 2021-08-08 ENCOUNTER — Emergency Department (HOSPITAL_BASED_OUTPATIENT_CLINIC_OR_DEPARTMENT_OTHER)
Admission: EM | Admit: 2021-08-08 | Discharge: 2021-08-08 | Disposition: A | Discharge status: Home / Self Care | Payer: No Typology Code available for payment source | Attending: Emergency Medicine | Admitting: Emergency Medicine

## 2021-08-08 ENCOUNTER — Emergency Department (HOSPITAL_BASED_OUTPATIENT_CLINIC_OR_DEPARTMENT_OTHER): Payer: No Typology Code available for payment source

## 2021-08-08 ENCOUNTER — Emergency Department (HOSPITAL_BASED_OUTPATIENT_CLINIC_OR_DEPARTMENT_OTHER): Payer: No Typology Code available for payment source | Admitting: Radiology

## 2021-08-08 ENCOUNTER — Encounter (HOSPITAL_BASED_OUTPATIENT_CLINIC_OR_DEPARTMENT_OTHER): Payer: Self-pay | Admitting: *Deleted

## 2021-08-08 DIAGNOSIS — Z20822 Contact with and (suspected) exposure to covid-19: Secondary | ICD-10-CM | POA: Diagnosis not present

## 2021-08-08 DIAGNOSIS — J45909 Unspecified asthma, uncomplicated: Secondary | ICD-10-CM | POA: Insufficient documentation

## 2021-08-08 DIAGNOSIS — R109 Unspecified abdominal pain: Secondary | ICD-10-CM | POA: Insufficient documentation

## 2021-08-08 DIAGNOSIS — J189 Pneumonia, unspecified organism: Secondary | ICD-10-CM | POA: Insufficient documentation

## 2021-08-08 DIAGNOSIS — R059 Cough, unspecified: Secondary | ICD-10-CM | POA: Diagnosis present

## 2021-08-08 LAB — CBC WITH DIFFERENTIAL/PLATELET
Abs Immature Granulocytes: 0.05 K/uL (ref 0.00–0.07)
Basophils Absolute: 0.1 K/uL (ref 0.0–0.1)
Basophils Relative: 0 %
Eosinophils Absolute: 0 K/uL (ref 0.0–0.5)
Eosinophils Relative: 0 %
HCT: 42.5 % (ref 39.0–52.0)
Hemoglobin: 14.1 g/dL (ref 13.0–17.0)
Immature Granulocytes: 0 %
Lymphocytes Relative: 9 %
Lymphs Abs: 1.3 K/uL (ref 0.7–4.0)
MCH: 27.5 pg (ref 26.0–34.0)
MCHC: 33.2 g/dL (ref 30.0–36.0)
MCV: 83 fL (ref 80.0–100.0)
Monocytes Absolute: 0.9 K/uL (ref 0.1–1.0)
Monocytes Relative: 6 %
Neutro Abs: 12.7 K/uL — ABNORMAL HIGH (ref 1.7–7.7)
Neutrophils Relative %: 85 %
Platelets: 351 K/uL (ref 150–400)
RBC: 5.12 MIL/uL (ref 4.22–5.81)
RDW: 12.5 % (ref 11.5–15.5)
WBC: 15.1 K/uL — ABNORMAL HIGH (ref 4.0–10.5)
nRBC: 0 % (ref 0.0–0.2)

## 2021-08-08 LAB — LIPASE, BLOOD: Lipase: 13 U/L (ref 11–51)

## 2021-08-08 LAB — GROUP A STREP BY PCR: Group A Strep by PCR: NOT DETECTED

## 2021-08-08 LAB — URINALYSIS, ROUTINE W REFLEX MICROSCOPIC
Bilirubin Urine: NEGATIVE
Glucose, UA: NEGATIVE mg/dL
Hgb urine dipstick: NEGATIVE
Ketones, ur: NEGATIVE mg/dL
Leukocytes,Ua: NEGATIVE
Nitrite: NEGATIVE
Specific Gravity, Urine: 1.018 (ref 1.005–1.030)
pH: 8.5 — ABNORMAL HIGH (ref 5.0–8.0)

## 2021-08-08 LAB — RESP PANEL BY RT-PCR (FLU A&B, COVID) ARPGX2
Influenza A by PCR: NEGATIVE
Influenza B by PCR: NEGATIVE
SARS Coronavirus 2 by RT PCR: NEGATIVE

## 2021-08-08 LAB — COMPREHENSIVE METABOLIC PANEL WITH GFR
ALT: 15 U/L (ref 0–44)
AST: 22 U/L (ref 15–41)
Albumin: 4.1 g/dL (ref 3.5–5.0)
Alkaline Phosphatase: 75 U/L (ref 38–126)
Anion gap: 9 (ref 5–15)
BUN: 13 mg/dL (ref 6–20)
CO2: 25 mmol/L (ref 22–32)
Calcium: 9.8 mg/dL (ref 8.9–10.3)
Chloride: 102 mmol/L (ref 98–111)
Creatinine, Ser: 0.71 mg/dL (ref 0.61–1.24)
GFR, Estimated: >60 mL/min
Glucose, Bld: 96 mg/dL (ref 70–99)
Potassium: 3.8 mmol/L (ref 3.5–5.1)
Sodium: 136 mmol/L (ref 135–145)
Total Bilirubin: 0.4 mg/dL (ref 0.3–1.2)
Total Protein: 7.5 g/dL (ref 6.5–8.1)

## 2021-08-08 MED ORDER — DOXYCYCLINE HYCLATE 100 MG PO TABS
100.0000 mg | ORAL_TABLET | Freq: Once | ORAL | Status: AC
  Administered 2021-08-08: 100 mg via ORAL
  Filled 2021-08-08: qty 1

## 2021-08-08 MED ORDER — AEROCHAMBER PLUS FLO-VU MEDIUM MISC
1.0000 | Freq: Once | Status: AC
  Administered 2021-08-08: 1
  Filled 2021-08-08: qty 1

## 2021-08-08 MED ORDER — ALBUTEROL SULFATE HFA 108 (90 BASE) MCG/ACT IN AERS
1.0000 | INHALATION_SPRAY | Freq: Once | RESPIRATORY_TRACT | Status: AC
Start: 2021-08-08 — End: 2021-08-08
  Administered 2021-08-08: 1 via RESPIRATORY_TRACT
  Filled 2021-08-08: qty 6.7

## 2021-08-08 MED ORDER — DOXYCYCLINE HYCLATE 100 MG PO CAPS: 100.0000 mg | ORAL_CAPSULE | Freq: Two times a day (BID) | ORAL | 0 refills | Status: DC

## 2021-08-08 MED ORDER — IOHEXOL 300 MG/ML  SOLN
100.0000 mL | Freq: Once | INTRAMUSCULAR | Status: AC | PRN
  Administered 2021-08-08: 100 mL via INTRAVENOUS

## 2021-08-08 MED ORDER — ONDANSETRON 4 MG PO TBDP
4.0000 mg | ORAL_TABLET | Freq: Once | ORAL | Status: AC
  Administered 2021-08-08: 4 mg via ORAL
  Filled 2021-08-08: qty 1

## 2021-08-08 MED ORDER — ACETAMINOPHEN 325 MG PO TABS
650.0000 mg | ORAL_TABLET | Freq: Once | ORAL | Status: AC
  Administered 2021-08-08: 650 mg via ORAL
  Filled 2021-08-08: qty 2

## 2021-08-08 MED ORDER — PREDNISONE 20 MG PO TABS: 40.0000 mg | ORAL_TABLET | Freq: Every day | ORAL | 0 refills | Status: DC

## 2021-08-08 MED ORDER — ONDANSETRON HCL 4 MG PO TABS: 4.0000 mg | ORAL_TABLET | Freq: Four times a day (QID) | ORAL | 0 refills | Status: DC

## 2021-08-08 NOTE — ED Notes (Signed)
Patient called with no answer. °

## 2021-08-08 NOTE — Discharge Instructions (Addendum)
You were seen here today for evaluation of you cough and cold symptoms. You CT showed signs of pneumonia.  I am sending home on a antibiotic called doxycycline to take twice a day for the next 5 days.  I have also prescribed you some Zofran to take for your nausea as needed.  Additionally prescribed you a prednisone burst to help with your symptoms.  Please take these as directed. You were given an albuterol inhaler here as well. Please use every 4-6 hours as needed. Take Tylenol as needed for bodyaches and fevers. I have included a work note for you as well.  ? ?Contact a doctor if: ?You have a fever. ?You lose sleep because your cough medicine does not help. ? ?Get help right away if: ?You are short of breath and this gets worse. ?You have more chest pain. ?Your sickness gets worse. This is very serious if: ?You are an older adult. ?Your body's defense system is weak. ?You cough up blood. ?These symptoms may be an emergency. Do not wait to see if the symptoms will go away. Get medical help right away. Call your local emergency services (911 in the [place] Do not drive yourself to the hospital. ?

## 2021-08-08 NOTE — ED Notes (Signed)
RT educated pt on proper use of MDI w/spacer. Pt stated he really does not use spacers. RT educated pt on the purpose of the spacer is to increase medication deposition for better symptom relief. Pt also stated he has never had a PFT and was diagnosed as a child w/asthma. RT suggested pt seek an appt w/pulmonologist for PFT to assess his "asthma" for better maintenance/support of disease process. Pt verbalized understanding.  ?

## 2021-08-08 NOTE — ED Provider Notes (Signed)
?[facility] EMERGENCY DEPT ?Provider Note ? ? ?CSN: [id number] ?Arrival date & time: 08/08/21  1745 ? ?  ? ?History ?Chief Complaint  ?Patient presents with  ? Illness  ? ? ?Chris Randall is a 46 y.o. male with history of substance use and asthma presents emerged department for evaluation of dry cough, abdominal cramps, shortness of breath, nausea, and fevers ranging from 100-104 along with body aches, nasal congestion, rhinorrhea for the past 5 days.  He reports that he has had close contact with family members with similar symptoms recently. He denies any sore throat, vomiting, diarrhea, constipation, dysuria, or hematuria.  He denies any melena or hematochezia.  He reports he has tried Alka-Seltzer cold and flu without much relief of symptoms. He has esophageal varices and takes Protonix twice a day for these. He has no known drug allergies.  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  Daily tobacco user. ? ? ?Illness ?Associated symptoms: abdominal pain, chest pain, congestion, cough, fever, myalgias, nausea, rhinorrhea and shortness of breath   ?Associated symptoms: no diarrhea and no vomiting   ? ?  ? ?Home Medications ?Prior to Admission medications   ?Medication Sig Start Date End Date Taking? Authorizing Provider  ?escitalopram (LEXAPRO) 5 MG tablet Take 1 tablet (5 mg total) by mouth daily. 12/30/16   Denzil Magnusonhomas, Lashunda, NP  ?gabapentin (NEURONTIN) 400 MG capsule Take 1 capsule (400 mg total) by mouth 3 (three) times daily. 12/29/16   Denzil Magnusonhomas, Lashunda, NP  ?hydrOXYzine (ATARAX/VISTARIL) 25 MG tablet Take 1 tablet (25 mg total) by mouth every 6 (six) hours as needed for anxiety. 12/29/16   Denzil Magnusonhomas, Lashunda, NP  ?methocarbamol (ROBAXIN) 750 MG tablet Take 1 tablet (750 mg total) by mouth every 8 (eight) hours as needed for muscle spasms. 12/29/16   Denzil Magnusonhomas, Lashunda, NP  ?pantoprazole (PROTONIX) 40 MG tablet Take 1 tablet (40 mg total) by mouth 2 (two) times daily before a  meal. 12/30/16   Denzil Magnusonhomas, Lashunda, NP  ?QUEtiapine (SEROQUEL) 25 MG tablet Take 1 tablet (25 mg total) by mouth daily. 12/30/16   Denzil Magnusonhomas, Lashunda, NP  ?QUEtiapine (SEROQUEL) 50 MG tablet Take 1 tablet (50 mg total) by mouth at bedtime. 12/29/16   Denzil Magnusonhomas, Lashunda, NP  ?sucralfate (CARAFATE) 1 GM/10ML suspension Take 10 mLs (1 g total) by mouth 4 (four) times daily -  with meals and at bedtime. 01/21/17   Audry PiliMohr, Tyler, PA-C  ?traZODone (DESYREL) 100 MG tablet Take 1 tablet (100 mg total) by mouth at bedtime as needed for sleep. 12/29/16   Denzil Magnusonhomas, Lashunda, NP  ?   ? ?Allergies    ?Ibuprofen and Vicodin [hydrocodone-acetaminophen]   ? ?Review of Systems   ?Review of Systems  ?Constitutional:  Positive for fever. Negative for chills.  ?HENT:  Positive for congestion and rhinorrhea.   ?Respiratory:  Positive for cough and shortness of breath.   ?Cardiovascular:  Positive for chest pain.  ?Gastrointestinal:  Positive for abdominal pain and nausea. Negative for constipation, diarrhea and vomiting.  ?Genitourinary:  Negative for dysuria and hematuria.  ?Musculoskeletal:  Positive for back pain and myalgias.  ? ?Physical Exam ?Updated Vital Signs ?BP 115/68   Pulse 87   Temp 99.7 ?F (37.6 ?C) (Oral)   Resp 18   Wt 81.6 kg   SpO2 97%   BMI 30.90 kg/m?  ?Physical Exam ?Vitals and nursing note reviewed.  ?Constitutional:   ?   General: He is not in acute distress. ?  Appearance: Normal appearance. He is not toxic-appearing.  ?HENT:  ?   Head: Normocephalic and atraumatic.  ?   Right Ear: Tympanic membrane, ear canal and external ear normal.  ?   Left Ear: Tympanic membrane, ear canal and external ear normal.  ?   Nose:  ?   Comments: Bilateral nasal turbinate edema and erythema with scant clear nasal discharge. ?   Mouth/Throat:  ?   Mouth: Mucous membranes are moist.  ?   Pharynx: No oropharyngeal exudate or posterior oropharyngeal erythema.  ?   Comments: No pharyngeal erythema, exudate, or edema noted.  Uvula midline.  Airway  patent.  Moist mucous membranes. ?Eyes:  ?   General: No scleral icterus. ?   Conjunctiva/sclera: Conjunctivae normal.  ?Cardiovascular:  ?   Rate and Rhythm: Normal rate and regular rhythm.  ?   Pulses: Normal pulses.  ?Pulmonary:  ?   Effort: Pulmonary effort is normal. No respiratory distress.  ?   Breath sounds: Normal breath sounds.  ?Abdominal:  ?   General: Abdomen is flat. Bowel sounds are normal.  ?   Palpations: Abdomen is soft.  ?   Tenderness: There is abdominal tenderness. There is no guarding or rebound.  ?Musculoskeletal:     ?   General: No deformity.  ?   Cervical back: Normal range of motion and neck supple. No rigidity or tenderness.  ?   Comments: No midline tenderness to the cervical, thoracic, or lumbar spine. Diffuse paraspinal tenderness. No overlying warmth or erythema.   ?Lymphadenopathy:  ?   Cervical: No cervical adenopathy.  ?Skin: ?   General: Skin is warm and dry.  ?   Capillary Refill: Capillary refill takes less than 2 seconds.  ?   Findings: No rash.  ?Neurological:  ?   General: No focal deficit present.  ?   Mental Status: He is alert. Mental status is at baseline.  ?   Cranial Nerves: No cranial nerve deficit.  ?   Sensory: No sensory deficit.  ?   Motor: No weakness.  ?   Comments: Strength 5 out of 5 in upper and lower bilateral extremities.  Cranial nerves II through XII intact.  Normal finger-nose.  Normal coordination.  ? ? ?ED Results / Procedures / Treatments   ?Labs ?(all labs ordered are listed, but only abnormal results are displayed) ?Labs Reviewed  ?CBC WITH DIFFERENTIAL/PLATELET - Abnormal; Notable for the following components:  ?    Result Value  ? WBC 15.1 (*)   ? Neutro Abs 12.7 (*)   ? All other components within normal limits  ?URINALYSIS, ROUTINE W REFLEX MICROSCOPIC - Abnormal; Notable for the following components:  ? pH 8.5 (*)   ? Protein, ur TRACE (*)   ? All other components within normal limits  ?RESP PANEL BY RT-PCR (FLU A&B, COVID) [id number]  ?GROUP A  STREP BY PCR  ?COMPREHENSIVE METABOLIC PANEL  ?LIPASE, BLOOD  ? ? ?EKG ?None ? ?Radiology ?DG Chest 2 View ? ?Result Date: 08/08/2021 ?CLINICAL DATA:  Dry cough. Fatigue and shortness of breath. Body aches. EXAM: CHEST - 2 VIEW COMPARISON:  05/04/2016. Included lung bases from abdominal CT performed same day FINDINGS: There are patchy bilateral airspace opacities, slight perihilar predominant distribution. Heart is normal in size. Normal mediastinal contours. No pleural effusion or pneumothorax. No acute osseous abnormalities are seen. IMPRESSION: Patchy bilateral airspace opacities, suspicious for multifocal pneumonia, including COVID 19. Electronically Signed   By: Ivette Loyal.D.  On: 08/08/2021 22:39  ? ?CT ABDOMEN PELVIS W CONTRAST ? ?Result Date: 08/08/2021 ?CLINICAL DATA:  Right lower quadrant abdominal pain. EXAM: CT ABDOMEN AND PELVIS WITH CONTRAST TECHNIQUE: Multidetector CT imaging of the abdomen and pelvis was performed using the standard protocol following bolus administration of intravenous contrast. RADIATION DOSE REDUCTION: This exam was performed according to the departmental dose-optimization program which includes automated exposure control, adjustment of the mA and/or kV according to patient size and/or use of iterative reconstruction technique. CONTRAST:  OMNIPAQUE IOHEXOL 300 MG/ML  SOLN COMPARISON:  CT 08/02/2016 FINDINGS: Lower chest: Patchy ground-glass opacities in the left greater than right lower lobe, only partially included in the field of view. No pleural effusion. Hepatobiliary: Focal fatty infiltration adjacent to the falciform ligament. 11 mm cyst in the subcapsular right lobe. Decreased hepatic steatosis from prior exam. Gallbladder physiologically distended, no calcified stone. No biliary dilatation. Pancreas: No ductal dilatation or inflammation. Spleen: Upper normal in size spanning 11.9 x 12.6 x 5.9 cm (volume = 460 cm^3). No focal splenic abnormality. Incidental  splenule inferiorly at the hilum. Adrenals/Urinary Tract: Normal adrenal glands. No hydronephrosis or perinephric edema. Homogeneous renal enhancement with symmetric excretion on delayed phase imaging. No renal

## 2021-08-08 NOTE — ED Triage Notes (Signed)
Pt has been sick with viral like illness for 5 days.  Pt had 2 family member with similar symptoms which began 2 days before his.  Pt has URI with cough and congestion and body aches and fatigue and feels sob with exertion.  Pt has headaches and abdominal cramping.  Pt has had loose stools, LBM today.  Pt has had fever and reports temp at home 104 at home 2 days ago.  ?

## 2021-11-28 ENCOUNTER — Other Ambulatory Visit: Payer: Self-pay

## 2021-11-28 ENCOUNTER — Emergency Department (HOSPITAL_BASED_OUTPATIENT_CLINIC_OR_DEPARTMENT_OTHER)
Admission: EM | Admit: 2021-11-28 | Discharge: 2021-11-28 | Disposition: A | Discharge status: Home / Self Care | Payer: 59 | Attending: Emergency Medicine | Admitting: Emergency Medicine

## 2021-11-28 ENCOUNTER — Encounter (HOSPITAL_BASED_OUTPATIENT_CLINIC_OR_DEPARTMENT_OTHER): Payer: Self-pay | Admitting: Emergency Medicine

## 2021-11-28 ENCOUNTER — Emergency Department (HOSPITAL_BASED_OUTPATIENT_CLINIC_OR_DEPARTMENT_OTHER): Payer: 59

## 2021-11-28 DIAGNOSIS — S8012XA Contusion of left lower leg, initial encounter: Secondary | ICD-10-CM | POA: Insufficient documentation

## 2021-11-28 DIAGNOSIS — S8992XA Unspecified injury of left lower leg, initial encounter: Secondary | ICD-10-CM | POA: Diagnosis present

## 2021-11-28 DIAGNOSIS — W228XXA Striking against or struck by other objects, initial encounter: Secondary | ICD-10-CM | POA: Insufficient documentation

## 2021-11-28 MED ORDER — PANTOPRAZOLE SODIUM 40 MG PO TBEC: 40.0000 mg | DELAYED_RELEASE_TABLET | Freq: Two times a day (BID) | ORAL | 0 refills | Status: DC

## 2021-11-28 MED ORDER — NAPROXEN 250 MG PO TABS
500.0000 mg | ORAL_TABLET | Freq: Once | ORAL | Status: AC
  Administered 2021-11-28: 500 mg via ORAL
  Filled 2021-11-28: qty 2

## 2021-11-28 NOTE — ED Provider Notes (Signed)
[facility] EMERGENCY DEPARTMENT Provider Note   CSN: [id number] Arrival date & time: 11/28/21  1154     History  Chief Complaint  Patient presents with   Leg Injury    Chris Randall is a 46 y.o. male.  Patient with left lower leg pain after hitting it with a hammer 2 days ago.  States he was "scrapping" and using 10 steps on a hammer to cut up some metal.  He missed the metal hit his leg with the edge of the hammer.  Having severe pain and difficulty with ambulation.  Denies using any medications.  Denies any other injury.  No head, neck, back, chest or abdominal pain.  No breaks in the skin.  Does have a small abrasion to his shin but states this is from scratching poison ivy not from the hammer injury. No blood thinner use.  Tetanus up-to-date  The history is provided by the patient.       Home Medications Prior to Admission medications   Medication Sig Start Date End Date Taking? Authorizing Provider  doxycycline (VIBRAMYCIN) 100 MG capsule Take 1 capsule (100 mg total) by mouth 2 (two) times daily. 08/08/21   Achille Rich, PA-C  escitalopram (LEXAPRO) 5 MG tablet Take 1 tablet (5 mg total) by mouth daily. 12/30/16   Denzil Magnuson, NP  gabapentin (NEURONTIN) 400 MG capsule Take 1 capsule (400 mg total) by mouth 3 (three) times daily. 12/29/16   Denzil Magnuson, NP  hydrOXYzine (ATARAX/VISTARIL) 25 MG tablet Take 1 tablet (25 mg total) by mouth every 6 (six) hours as needed for anxiety. 12/29/16   Denzil Magnuson, NP  methocarbamol (ROBAXIN) 750 MG tablet Take 1 tablet (750 mg total) by mouth every 8 (eight) hours as needed for muscle spasms. 12/29/16   Denzil Magnuson, NP  ondansetron (ZOFRAN) 4 MG tablet Take 1 tablet (4 mg total) by mouth every 6 (six) hours. 08/08/21   Achille Rich, PA-C  pantoprazole (PROTONIX) 40 MG tablet Take 1 tablet (40 mg total) by mouth 2 (two) times daily before a meal. 12/30/16   Denzil Magnuson, NP  predniSONE (DELTASONE) 20 MG tablet  Take 2 tablets (40 mg total) by mouth daily. 08/08/21   Achille Rich, PA-C  QUEtiapine (SEROQUEL) 25 MG tablet Take 1 tablet (25 mg total) by mouth daily. 12/30/16   Denzil Magnuson, NP  QUEtiapine (SEROQUEL) 50 MG tablet Take 1 tablet (50 mg total) by mouth at bedtime. 12/29/16   Denzil Magnuson, NP  sucralfate (CARAFATE) 1 GM/10ML suspension Take 10 mLs (1 g total) by mouth 4 (four) times daily -  with meals and at bedtime. 01/21/17   Audry Pili, PA-C  traZODone (DESYREL) 100 MG tablet Take 1 tablet (100 mg total) by mouth at bedtime as needed for sleep. 12/29/16   Denzil Magnuson, NP      Allergies    Ibuprofen and Vicodin [hydrocodone-acetaminophen]    Review of Systems   Review of Systems  Constitutional:  Negative for activity change, appetite change and fever.  HENT:  Negative for congestion and rhinorrhea.   Respiratory:  Negative for shortness of breath.   Cardiovascular:  Negative for chest pain.  Gastrointestinal:  Negative for abdominal pain, nausea and vomiting.  Musculoskeletal:  Positive for arthralgias and myalgias.  Skin:  Positive for wound.  Neurological:  Negative for dizziness and headaches.   all other systems are negative except as noted in the HPI and PMH.    Physical Exam Updated Vital Signs BP Marland Kitchen)  138/96 (BP Location: Left Arm)   Pulse 90   Temp 97.9 F (36.6 C) (Oral)   Resp (!) 24   Ht 5\' 4"  (1.626 m)   Wt 79.4 kg   SpO2 100%   BMI 30.04 kg/m  Physical Exam Vitals and nursing note reviewed.  Constitutional:      General: He is not in acute distress.    Appearance: He is well-developed.  HENT:     Head: Normocephalic and atraumatic.     Mouth/Throat:     Pharynx: No oropharyngeal exudate.  Eyes:     Conjunctiva/sclera: Conjunctivae normal.     Pupils: Pupils are equal, round, and reactive to light.  Neck:     Comments: No meningismus. Cardiovascular:     Rate and Rhythm: Normal rate and regular rhythm.     Heart sounds: Normal heart sounds.  No murmur heard. Pulmonary:     Effort: Pulmonary effort is normal. No respiratory distress.     Breath sounds: Normal breath sounds.  Abdominal:     Palpations: Abdomen is soft.     Tenderness: There is no abdominal tenderness. There is no guarding or rebound.  Musculoskeletal:        General: Swelling and tenderness present. Normal range of motion.     Cervical back: Normal range of motion and neck supple.     Comments: Ecchymosis and point tenderness to left shin. No deformity. Intact DP and PT pulses. Full range of motion of ankle and knee bilaterally  Skin:    General: Skin is warm.  Neurological:     Mental Status: He is alert and oriented to person, place, and time.     Cranial Nerves: No cranial nerve deficit.     Motor: No abnormal muscle tone.     Coordination: Coordination normal.     Comments:  5/5 strength throughout. CN 2-12 intact.Equal grip strength.   Psychiatric:        Behavior: Behavior normal.     ED Results / Procedures / Treatments   Labs (all labs ordered are listed, but only abnormal results are displayed) Labs Reviewed - No data to display  EKG None  Radiology DG Tibia/Fibula Left  Result Date: 11/28/2021 CLINICAL DATA:  Leg injury EXAM: LEFT TIBIA AND FIBULA - 2 VIEW COMPARISON:  None Available. FINDINGS: There is no evidence of fracture or other focal bone lesions. Soft tissues are unremarkable. IMPRESSION: No acute osseous abnormality identified. Electronically Signed   By: 01/29/2022 M.D.   On: 11/28/2021 13:38    Procedures Procedures    Medications Ordered in ED Medications - No data to display  ED Course/ Medical Decision Making/ A&P                           Medical Decision Making Amount and/or Complexity of Data Reviewed Labs: ordered. Decision-making details documented in ED Course. Radiology: ordered and independent interpretation performed. Decision-making details documented in ED Course. ECG/medicine tests: ordered  and independent interpretation performed. Decision-making details documented in ED Course.  Risk Prescription drug management.  Blunt trauma to left shin from hammer injury.  Neurovascular intact.  Compartments soft.  X-ray negative for fracture.  Results reviewed and interpreted by me.  Tetanus up-to-date.  Neurovascular intact.  No evidence of compartment syndrome.  Patient requesting refills of Protonix and Claritin.  Cannot take NSAIDs due to history of gastritis and ulcers.  Discussed Tylenol, ice, elevation and PCP follow-up.  Return precautions discussed       Final Clinical Impression(s) / ED Diagnoses Final diagnoses:  None    Rx / DC Orders ED Discharge Orders     None         Hal Norrington, Jeannett Senior, MD 11/28/21 1405

## 2021-11-28 NOTE — ED Notes (Signed)
ED Provider at bedside. 

## 2021-11-28 NOTE — Discharge Instructions (Signed)
Your x-rays negative for fracture.  Use Tylenol, ice and elevation.  Avoid regular ibuprofen or naproxen given your history of ulcers.  Follow-up with your primary doctor.  return to ED with new or worsening symptoms.

## 2021-11-28 NOTE — ED Triage Notes (Signed)
Pt arrives pov, slow limping gait, c/o Distal LLE injury after hitting leg accidentally with hammer x 2 days pta, tenderness noted

## 2022-01-25 ENCOUNTER — Encounter (HOSPITAL_COMMUNITY): Payer: Self-pay

## 2022-01-25 ENCOUNTER — Other Ambulatory Visit: Payer: Self-pay

## 2022-01-25 ENCOUNTER — Emergency Department (HOSPITAL_COMMUNITY)
Admission: EM | Admit: 2022-01-25 | Discharge: 2022-01-25 | Disposition: A | Discharge status: Home / Self Care | Payer: No Typology Code available for payment source | Attending: Emergency Medicine | Admitting: Emergency Medicine

## 2022-01-25 ENCOUNTER — Emergency Department (HOSPITAL_COMMUNITY): Payer: No Typology Code available for payment source

## 2022-01-25 DIAGNOSIS — J45909 Unspecified asthma, uncomplicated: Secondary | ICD-10-CM | POA: Insufficient documentation

## 2022-01-25 DIAGNOSIS — M545 Low back pain, unspecified: Secondary | ICD-10-CM | POA: Diagnosis not present

## 2022-01-25 DIAGNOSIS — K5900 Constipation, unspecified: Secondary | ICD-10-CM | POA: Diagnosis not present

## 2022-01-25 DIAGNOSIS — R109 Unspecified abdominal pain: Secondary | ICD-10-CM | POA: Diagnosis present

## 2022-01-25 DIAGNOSIS — K219 Gastro-esophageal reflux disease without esophagitis: Secondary | ICD-10-CM | POA: Insufficient documentation

## 2022-01-25 DIAGNOSIS — R1084 Generalized abdominal pain: Secondary | ICD-10-CM | POA: Insufficient documentation

## 2022-01-25 LAB — URINALYSIS, ROUTINE W REFLEX MICROSCOPIC
Bilirubin Urine: NEGATIVE
Glucose, UA: NEGATIVE mg/dL
Hgb urine dipstick: NEGATIVE
Ketones, ur: NEGATIVE mg/dL
Leukocytes,Ua: NEGATIVE
Nitrite: NEGATIVE
Protein, ur: NEGATIVE mg/dL
Specific Gravity, Urine: 1.039 — ABNORMAL HIGH (ref 1.005–1.030)
pH: 6 (ref 5.0–8.0)

## 2022-01-25 LAB — COMPREHENSIVE METABOLIC PANEL
ALT: 19 U/L (ref 0–44)
AST: 22 U/L (ref 15–41)
Albumin: 4.1 g/dL (ref 3.5–5.0)
Alkaline Phosphatase: 94 U/L (ref 38–126)
Anion gap: 9 (ref 5–15)
BUN: 11 mg/dL (ref 6–20)
CO2: 25 mmol/L (ref 22–32)
Calcium: 9.5 mg/dL (ref 8.9–10.3)
Chloride: 105 mmol/L (ref 98–111)
Creatinine, Ser: 1.01 mg/dL (ref 0.61–1.24)
GFR, Estimated: >60 mL/min (ref >60)
Glucose, Bld: 134 mg/dL — ABNORMAL HIGH (ref 70–99)
Potassium: 4 mmol/L (ref 3.5–5.1)
Sodium: 139 mmol/L (ref 135–145)
Total Bilirubin: 0.8 mg/dL (ref 0.3–1.2)
Total Protein: 7.3 g/dL (ref 6.5–8.1)

## 2022-01-25 LAB — POC OCCULT BLOOD, ED: Fecal Occult Bld: NEGATIVE

## 2022-01-25 LAB — CBC
HCT: 55 % — ABNORMAL HIGH (ref 39.0–52.0)
Hemoglobin: 17.3 g/dL — ABNORMAL HIGH (ref 13.0–17.0)
MCH: 27.6 pg (ref 26.0–34.0)
MCHC: 31.5 g/dL (ref 30.0–36.0)
MCV: 87.7 fL (ref 80.0–100.0)
Platelets: 265 10*3/uL (ref 150–400)
RBC: 6.27 MIL/uL — ABNORMAL HIGH (ref 4.22–5.81)
RDW: 12.7 % (ref 11.5–15.5)
WBC: 8 10*3/uL (ref 4.0–10.5)
nRBC: 0 % (ref 0.0–0.2)

## 2022-01-25 LAB — TYPE AND SCREEN
ABO/RH(D): O POS
Antibody Screen: NEGATIVE

## 2022-01-25 LAB — PROTIME-INR
INR: 1.2 (ref 0.8–1.2)
Prothrombin Time: 15.3 seconds — ABNORMAL HIGH (ref 11.4–15.2)

## 2022-01-25 LAB — LIPASE, BLOOD: Lipase: 110 U/L — ABNORMAL HIGH (ref 11–51)

## 2022-01-25 MED ORDER — SODIUM CHLORIDE 0.9 % IV BOLUS
1000.0000 mL | Freq: Once | INTRAVENOUS | Status: AC
  Administered 2022-01-25: 1000 mL via INTRAVENOUS

## 2022-01-25 MED ORDER — ONDANSETRON 4 MG PO TBDP: 4.0000 mg | ORAL_TABLET | Freq: Three times a day (TID) | ORAL | 0 refills | Status: DC | PRN

## 2022-01-25 MED ORDER — MORPHINE SULFATE (PF) 4 MG/ML IV SOLN
4.0000 mg | Freq: Once | INTRAVENOUS | Status: AC
  Administered 2022-01-25: 4 mg via INTRAVENOUS
  Filled 2022-01-25: qty 1

## 2022-01-25 MED ORDER — PANTOPRAZOLE 80MG IVPB - SIMPLE MED
80.0000 mg | Freq: Once | INTRAVENOUS | Status: AC
  Administered 2022-01-25: 80 mg via INTRAVENOUS
  Filled 2022-01-25: qty 80

## 2022-01-25 MED ORDER — METHOCARBAMOL 500 MG PO TABS: 500.0000 mg | ORAL_TABLET | Freq: Four times a day (QID) | ORAL | 0 refills | Status: DC | PRN

## 2022-01-25 MED ORDER — ONDANSETRON HCL 4 MG/2ML IJ SOLN
4.0000 mg | Freq: Once | INTRAMUSCULAR | Status: AC
  Administered 2022-01-25: 4 mg via INTRAVENOUS
  Filled 2022-01-25: qty 2

## 2022-01-25 MED ORDER — IOHEXOL 300 MG/ML  SOLN
100.0000 mL | Freq: Once | INTRAMUSCULAR | Status: AC | PRN
  Administered 2022-01-25: 100 mL via INTRAVENOUS

## 2022-01-25 MED ORDER — PANTOPRAZOLE SODIUM 40 MG PO TBEC: 40.0000 mg | DELAYED_RELEASE_TABLET | Freq: Every day | ORAL | 0 refills | Status: DC

## 2022-01-25 MED ORDER — METHOCARBAMOL 500 MG PO TABS
1000.0000 mg | ORAL_TABLET | Freq: Once | ORAL | Status: AC
  Administered 2022-01-25: 1000 mg via ORAL
  Filled 2022-01-25: qty 2

## 2022-01-25 NOTE — Discharge Instructions (Signed)

## 2022-01-25 NOTE — ED Provider Notes (Signed)
Emergency Department Provider Note   I have reviewed the triage vital signs and the nursing notes.   HISTORY  Chief Complaint Abdominal Pain and Melena   HPI Chris Randall is a 46 y.o. male with past medical history reviewed below including hepatitis C status posttreatment, Barrett's esophagus, GERD, and prior history of hematemesis presents with severe abdominal pain and black bowel movements this morning.  He has not been vomiting blood.  He has had 2 days of worsening abdominal discomfort mainly in the right epigastrium but now feeling discomfort more diffusely.  He had some constipation but this morning had a bowel movement which was "jet black" and prompted him to be evaluated.  He follows with gastroenterology at [facility] and had an endoscopy in 2022.  He has run out of his Protonix recently but has been taking Tums. No discomfort into the chest. No SOB. No fever/chills.    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    Bronchitis    Esophageal varices (HCC) 2003 vs 2008   pt reprted varices on EGD in [place] "10 to 15 years ago. No varices or portal gastropathy on EGD 08/02/16   Hematemesis 08/02/2016   Hx GERD, findings on 08/02/16 EGD: esophagitis LA grade B, ?underlying Barrett's esphagus; 4 cm HH; hemorrhagic gastritis and red blood in stomach (source or bleeding); NO VARICES OR PORTALGASTROPATHY.    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 Respiratory: Denies shortness of breath. Gastrointestinal: Positive abdominal pain.  Positive nausea, no vomiting.  No diarrhea.  No constipation. Positive black in the BMs. Genitourinary: Negative for dysuria. Musculoskeletal: Negative for back pain. Skin:  Negative for rash. Neurological: Negative for headaches, focal weakness or numbness.   ____________________________________________   PHYSICAL EXAM:  VITAL SIGNS: ED Triage Vitals  Enc Vitals Group     BP 01/25/22 1154 129/86     Pulse Rate 01/25/22 1154 (!) 105     Resp 01/25/22 1154 16     Temp 01/25/22 1154 98.1 F (36.7 C)     Temp Source 01/25/22 1154 Oral     SpO2 01/25/22 1154 99 %     Weight 01/25/22 1200 145 lb (65.8 kg)     Height 01/25/22 1200 5\' 4"  (1.626 m)   Constitutional: Alert and oriented. Appears uncomfortable.  Eyes: Conjunctivae are normal.  Head: Atraumatic. Nose: No congestion/rhinnorhea. Mouth/Throat: Mucous membranes are moist.   Neck: No stridor.   Cardiovascular: Normal rate, regular rhythm. Good peripheral circulation. Grossly normal heart sounds.   Respiratory: Normal respiratory effort.  No retractions. Lungs CTAB. Gastrointestinal: Soft with diffuse tenderness worse in the right upper abdomen. No distention.  Rectal exam performed with patient's verbal consent and nurse chaperone.  No visible hemorrhoids.  Dark stool on exam without melena. No bright red blood.  Musculoskeletal: No gross deformities of extremities. Neurologic:  Normal speech and language. Skin:  Skin is warm, dry and intact. No rash noted.   ____________________________________________   LABS (all labs ordered are listed, but only abnormal results are displayed)  Labs Reviewed  LIPASE, BLOOD - Abnormal; Notable for the following components:      Result Value   Lipase 110 (*)    All other components within normal  limits  COMPREHENSIVE METABOLIC PANEL - Abnormal; Notable for the following components:   Glucose, Bld 134 (*)    All other components within normal limits  CBC - Abnormal; Notable for the following components:   RBC 6.27 (*)    Hemoglobin 17.3 (*)    HCT 55.0 (*)    All other components within normal limits  URINALYSIS, ROUTINE W REFLEX MICROSCOPIC -  Abnormal; Notable for the following components:   Specific Gravity, Urine 1.039 (*)    All other components within normal limits  PROTIME-INR - Abnormal; Notable for the following components:   Prothrombin Time 15.3 (*)    All other components within normal limits  POC OCCULT BLOOD, ED  TYPE AND SCREEN    ____________________________________________   PROCEDURES  Procedure(s) performed:   Procedures  None  ____________________________________________   INITIAL IMPRESSION / ASSESSMENT AND PLAN / ED COURSE  Pertinent labs & imaging results that were available during my care of the patient were reviewed by me and considered in my medical decision making (see chart for details).   This patient is Presenting for Evaluation of abdominal pain, which does require a range of treatment options, and is a complaint that involves a high risk of morbidity and mortality.  The Differential Diagnoses includes but is not exclusive to acute cholecystitis, intrathoracic causes for epigastric abdominal pain, gastritis, duodenitis, pancreatitis, small bowel or large bowel obstruction, abdominal aortic aneurysm, hernia, gastritis, etc.   Critical Interventions-    Medications  sodium chloride 0.9 % bolus 1,000 mL (0 mLs Intravenous Stopped 01/25/22 1331)  ondansetron (ZOFRAN) injection 4 mg (4 mg Intravenous Given 01/25/22 1225)  morphine (PF) 4 MG/ML injection 4 mg (4 mg Intravenous Given 01/25/22 1225)  pantoprazole (PROTONIX) 80 mg /NS 100 mL IVPB (0 mg Intravenous Stopped 01/25/22 1331)  iohexol (OMNIPAQUE) 300 MG/ML solution 100 mL (100 mLs Intravenous Contrast Given 01/25/22 1328)  methocarbamol (ROBAXIN) tablet 1,000 mg (1,000 mg Oral Given 01/25/22 1456)    Reassessment after intervention:  Symptoms improved.    I did obtain Additional Historical Information from wife at bedside.   I decided to review pertinent External Data, and in summary patient followed by gastroenterology in the [place]  system.  Last endoscopy from 2022.  Has seen Dr. Leone Payor in 2018 with endoscopy at that time but no varices noted.  Hepatitis C has been treated.    Clinical Laboratory Tests Ordered, included lipase mildly elevated to 110 but no radiographic findings to suggest acute pancreatitis.  LFTs and bilirubin are normal.Hemoccult negative.   Radiologic Tests Ordered, included CT abdomen/pelvis and CXR. I independently interpreted the images and agree with radiology interpretation.   Cardiac Monitor Tracing which shows mild tachycardia.    Social Determinants of Health Risk patient is a smoker.   Medical Decision Making: Summary:  Patient presents emergency department valuation of abdominal pain with black bowel movements this morning.  No gross melena on my exam although stools are darker in color.  No bright red blood.  Mild tachycardia without hypotension.  Plan for CT imaging given tenderness on exam and patient's overall appearance (uncomfortable). Plan for IV protonix as well.   Reevaluation with update and discussion with patient and wife.  He is feeling improved with medication here.  Hemoccult is negative.  CT without acute finding.  Mild elevation in lipase but no inflammatory changes on CT surrounding the pancreas.  Given his improved symptoms plan for discharge home with symptom management with strict ED return precaution.  Considered admission but labs and imaging are reassuring.  Plan for discharge with strict ED return precautions.   Disposition: discharge  ____________________________________________  FINAL CLINICAL IMPRESSION(S) / ED DIAGNOSES  Final diagnoses:  Generalized abdominal pain  Acute midline low back pain without sciatica     NEW OUTPATIENT MEDICATIONS STARTED DURING THIS VISIT:  Discharge Medication List as of 01/25/2022  3:28 PM     START taking these medications   Details  ondansetron (ZOFRAN-ODT) 4 MG disintegrating tablet Take 1 tablet (4 mg total) by mouth  every 8 (eight) hours as needed for nausea or vomiting., Starting Mon 01/25/2022, Normal        Note:  This document was prepared using Dragon voice recognition software and may include unintentional dictation errors.  Alona Bene, MD, [facility] Emergency Medicine    Mykah Bellomo, Arlyss Repress, MD 01/28/22 [phone]

## 2022-01-25 NOTE — ED Triage Notes (Signed)
Patient c/o mid abdominal pain that radiates into the bilateral low back. Patient states he had a black starry stool this AM.  Patient also reports that he was having urinary retention 4 days ago.

## 2022-04-27 ENCOUNTER — Ambulatory Visit: Payer: No Typology Code available for payment source | Admitting: Family Medicine

## 2022-04-28 ENCOUNTER — Encounter (HOSPITAL_BASED_OUTPATIENT_CLINIC_OR_DEPARTMENT_OTHER): Payer: Self-pay

## 2022-04-28 ENCOUNTER — Emergency Department (HOSPITAL_COMMUNITY)
Admission: EM | Admit: 2022-04-28 | Discharge: 2022-04-28 | Discharge status: Against Medical Advice | Payer: No Typology Code available for payment source

## 2022-04-28 ENCOUNTER — Emergency Department (HOSPITAL_BASED_OUTPATIENT_CLINIC_OR_DEPARTMENT_OTHER): Payer: No Typology Code available for payment source

## 2022-04-28 ENCOUNTER — Other Ambulatory Visit: Payer: Self-pay

## 2022-04-28 ENCOUNTER — Emergency Department (HOSPITAL_BASED_OUTPATIENT_CLINIC_OR_DEPARTMENT_OTHER)
Admission: EM | Admit: 2022-04-28 | Discharge: 2022-04-28 | Discharge status: Against Medical Advice | Payer: No Typology Code available for payment source | Attending: Emergency Medicine | Admitting: Emergency Medicine

## 2022-04-28 DIAGNOSIS — R1030 Lower abdominal pain, unspecified: Secondary | ICD-10-CM | POA: Insufficient documentation

## 2022-04-28 DIAGNOSIS — Z5321 Procedure and treatment not carried out due to patient leaving prior to being seen by health care provider: Secondary | ICD-10-CM | POA: Insufficient documentation

## 2022-04-28 LAB — LIPASE, BLOOD: Lipase: 41 U/L (ref 11–51)

## 2022-04-28 LAB — URINALYSIS, ROUTINE W REFLEX MICROSCOPIC
Bilirubin Urine: NEGATIVE
Glucose, UA: NEGATIVE mg/dL
Hgb urine dipstick: NEGATIVE
Ketones, ur: NEGATIVE mg/dL
Leukocytes,Ua: NEGATIVE
Nitrite: NEGATIVE
Protein, ur: NEGATIVE mg/dL
Specific Gravity, Urine: <1.005 — ABNORMAL LOW (ref 1.005–1.030)
pH: 6.5 (ref 5.0–8.0)

## 2022-04-28 LAB — CBC WITH DIFFERENTIAL/PLATELET
Abs Immature Granulocytes: 0.03 10*3/uL (ref 0.00–0.07)
Basophils Absolute: 0.1 10*3/uL (ref 0.0–0.1)
Basophils Relative: 1 %
Eosinophils Absolute: 0.3 10*3/uL (ref 0.0–0.5)
Eosinophils Relative: 4 %
HCT: 47.7 % (ref 39.0–52.0)
Hemoglobin: 15.7 g/dL (ref 13.0–17.0)
Immature Granulocytes: 0 %
Lymphocytes Relative: 25 %
Lymphs Abs: 2.1 10*3/uL (ref 0.7–4.0)
MCH: 28 pg (ref 26.0–34.0)
MCHC: 32.9 g/dL (ref 30.0–36.0)
MCV: 85 fL (ref 80.0–100.0)
Monocytes Absolute: 0.7 10*3/uL (ref 0.1–1.0)
Monocytes Relative: 8 %
Neutro Abs: 5.3 10*3/uL (ref 1.7–7.7)
Neutrophils Relative %: 62 %
Platelets: 241 10*3/uL (ref 150–400)
RBC: 5.61 MIL/uL (ref 4.22–5.81)
RDW: 12.4 % (ref 11.5–15.5)
WBC: 8.5 10*3/uL (ref 4.0–10.5)
nRBC: 0 % (ref 0.0–0.2)

## 2022-04-28 LAB — COMPREHENSIVE METABOLIC PANEL
ALT: 12 U/L (ref 0–44)
AST: 15 U/L (ref 15–41)
Albumin: 4.2 g/dL (ref 3.5–5.0)
Alkaline Phosphatase: 89 U/L (ref 38–126)
Anion gap: 7 (ref 5–15)
BUN: 12 mg/dL (ref 6–20)
CO2: 28 mmol/L (ref 22–32)
Calcium: 9.3 mg/dL (ref 8.9–10.3)
Chloride: 103 mmol/L (ref 98–111)
Creatinine, Ser: 1 mg/dL (ref 0.61–1.24)
GFR, Estimated: >60 mL/min (ref >60)
Glucose, Bld: 85 mg/dL (ref 70–99)
Potassium: 4.7 mmol/L (ref 3.5–5.1)
Sodium: 138 mmol/L (ref 135–145)
Total Bilirubin: 0.5 mg/dL (ref 0.3–1.2)
Total Protein: 6.8 g/dL (ref 6.5–8.1)

## 2022-04-28 MED ORDER — IOHEXOL 300 MG/ML  SOLN
100.0000 mL | Freq: Once | INTRAMUSCULAR | Status: AC | PRN
  Administered 2022-04-28: 85 mL via INTRAVENOUS

## 2022-04-28 NOTE — ED Notes (Signed)
IV Removed by this NT per pt request. Pt left AMA. PA Vernona Rieger notified.

## 2022-04-28 NOTE — ED Provider Triage Note (Cosign Needed)
Emergency Medicine Provider Triage Evaluation Note  Chris Randall , a 46 y.o. male  was evaluated in triage.  Pt complains of lower abdominal and back pain onset 1.5 weeks ago, worsening, pain worse with breathing.  No prior abdominal surgery  Review of Systems  Positive: Nausea, vomiting (x 1 a few days ago) Negative: Fevers, chills, changes bowel/bladder habits   Physical Exam  There were no vitals taken for this visit. Gen:   Awake, no distress   Resp:  Normal effort  MSK:   Moves extremities without difficulty  Other:  No CVA tenderness, diffuse abdominal pain  Medical Decision Making  Medically screening exam initiated at 1:50 PM.  Appropriate orders placed.  Chris Randall was informed that the remainder of the evaluation will be completed by another provider, this initial triage assessment does not replace that evaluation, and the importance of remaining in the ED until their evaluation is complete.     Jeannie Fend, PA-C 04/28/22 1352

## 2022-04-28 NOTE — ED Notes (Signed)
Pt informed this Medic, he wanted to leave. This medic informed the PA, they evaluated the chart and stated that it was up to him. Pt was informed if he left he was leaving AMA. Pt did not want to answer any questions past him stating he wanted to leave and get the IV out. IV was removed. Pt left with family.

## 2022-04-28 NOTE — ED Triage Notes (Signed)
Patient here POV from Home.  Endorses LLQ Pain and Left Flank Pain that began 1.5 Weeks ago.  Some nausea and 1 Episode of emesis recently. No Diarrhea. No Dysuria. No Fevers.   NAD Noted during Triage. A&Ox4. GCS 15. Ambulatory.

## 2022-04-29 ENCOUNTER — Emergency Department (HOSPITAL_COMMUNITY)
Admission: EM | Admit: 2022-04-29 | Discharge: 2022-04-29 | Disposition: A | Discharge status: Home / Self Care | Payer: No Typology Code available for payment source | Attending: Emergency Medicine | Admitting: Emergency Medicine

## 2022-04-29 ENCOUNTER — Other Ambulatory Visit: Payer: Self-pay

## 2022-04-29 ENCOUNTER — Encounter (HOSPITAL_COMMUNITY): Payer: Self-pay

## 2022-04-29 DIAGNOSIS — R112 Nausea with vomiting, unspecified: Secondary | ICD-10-CM | POA: Insufficient documentation

## 2022-04-29 DIAGNOSIS — M545 Low back pain, unspecified: Secondary | ICD-10-CM | POA: Insufficient documentation

## 2022-04-29 DIAGNOSIS — R1084 Generalized abdominal pain: Secondary | ICD-10-CM | POA: Insufficient documentation

## 2022-04-29 DIAGNOSIS — J45909 Unspecified asthma, uncomplicated: Secondary | ICD-10-CM | POA: Insufficient documentation

## 2022-04-29 DIAGNOSIS — F17210 Nicotine dependence, cigarettes, uncomplicated: Secondary | ICD-10-CM | POA: Diagnosis not present

## 2022-04-29 LAB — URINALYSIS, ROUTINE W REFLEX MICROSCOPIC
Bilirubin Urine: NEGATIVE
Glucose, UA: NEGATIVE mg/dL
Hgb urine dipstick: NEGATIVE
Ketones, ur: NEGATIVE mg/dL
Leukocytes,Ua: NEGATIVE
Nitrite: NEGATIVE
Protein, ur: NEGATIVE mg/dL
Specific Gravity, Urine: 1.014 (ref 1.005–1.030)
pH: 6 (ref 5.0–8.0)

## 2022-04-29 LAB — COMPREHENSIVE METABOLIC PANEL
ALT: 14 U/L (ref 0–44)
AST: 22 U/L (ref 15–41)
Albumin: 3.7 g/dL (ref 3.5–5.0)
Alkaline Phosphatase: 84 U/L (ref 38–126)
Anion gap: 7 (ref 5–15)
BUN: 7 mg/dL (ref 6–20)
CO2: 24 mmol/L (ref 22–32)
Calcium: 9.1 mg/dL (ref 8.9–10.3)
Chloride: 106 mmol/L (ref 98–111)
Creatinine, Ser: 0.9 mg/dL (ref 0.61–1.24)
GFR, Estimated: >60 mL/min (ref >60)
Glucose, Bld: 98 mg/dL (ref 70–99)
Potassium: 4.2 mmol/L (ref 3.5–5.1)
Sodium: 137 mmol/L (ref 135–145)
Total Bilirubin: 0.2 mg/dL — ABNORMAL LOW (ref 0.3–1.2)
Total Protein: 6.3 g/dL — ABNORMAL LOW (ref 6.5–8.1)

## 2022-04-29 LAB — CBC
HCT: 47.2 % (ref 39.0–52.0)
Hemoglobin: 16 g/dL (ref 13.0–17.0)
MCH: 28.5 pg (ref 26.0–34.0)
MCHC: 33.9 g/dL (ref 30.0–36.0)
MCV: 84 fL (ref 80.0–100.0)
Platelets: 238 10*3/uL (ref 150–400)
RBC: 5.62 MIL/uL (ref 4.22–5.81)
RDW: 12.2 % (ref 11.5–15.5)
WBC: 5.4 10*3/uL (ref 4.0–10.5)
nRBC: 0 % (ref 0.0–0.2)

## 2022-04-29 LAB — LIPASE, BLOOD: Lipase: 47 U/L (ref 11–51)

## 2022-04-29 MED ORDER — SODIUM CHLORIDE 0.9 % IV BOLUS
1000.0000 mL | Freq: Once | INTRAVENOUS | Status: AC
  Administered 2022-04-29: 1000 mL via INTRAVENOUS

## 2022-04-29 MED ORDER — SODIUM CHLORIDE 0.9 % IV SOLN
40.0000 mg | Freq: Once | INTRAVENOUS | Status: AC
  Administered 2022-04-29: 40 mg via INTRAVENOUS
  Filled 2022-04-29: qty 4

## 2022-04-29 MED ORDER — ONDANSETRON 4 MG PO TBDP
4.0000 mg | ORAL_TABLET | Freq: Once | ORAL | Status: AC
  Administered 2022-04-29: 4 mg via ORAL
  Filled 2022-04-29: qty 1

## 2022-04-29 MED ORDER — ONDANSETRON 4 MG PO TBDP: 4.0000 mg | ORAL_TABLET | Freq: Three times a day (TID) | ORAL | 0 refills | Status: DC | PRN

## 2022-04-29 MED ORDER — DROPERIDOL 2.5 MG/ML IJ SOLN
2.5000 mg | Freq: Once | INTRAMUSCULAR | Status: AC
  Administered 2022-04-29: 2.5 mg via INTRAVENOUS
  Filled 2022-04-29: qty 2

## 2022-04-29 MED ORDER — PANTOPRAZOLE SODIUM 40 MG PO TBEC: 40.0000 mg | DELAYED_RELEASE_TABLET | Freq: Every day | ORAL | 0 refills | Status: DC

## 2022-04-29 NOTE — ED Provider Notes (Signed)
[facility] Provider Note  CSN: [id number] Arrival date & time: 04/29/22 [id number]  Chief Complaint(s) Nausea, Abdominal Pain, and Back Pain  HPI Carson MyrtleStephen R Boal is a 46 y.o. male presenting to the emergency department with abdominal pain.  He reports abdominal pain for 1-1/2 weeks.  He reports the pain is diffuse and goes to his back.  He reports some nausea and vomiting.  No urinary symptoms.  No diarrhea.  Symptoms is constant.  No fevers or chills.  He reports he is not taking pantoprazole.  He denies any ongoing alcohol use,, polysubstance abuse.  Nothing makes his symptoms better or worse.  No chest pain, shortness of breath.  No fevers or chills.  Reports it feels somewhat similar to he was in the emergency department in September but worse   Past Medical History Past Medical History:  Diagnosis Date   Asthma    Bilateral varicoceles 03/2015   also left hydrocele per ultrasound.    Bronchitis    Esophageal varices (HCC) 2003 vs 2008   pt reprted varices on EGD in [place] "10 to 15 years ago. No varices or portal gastropathy on EGD 08/02/16   Hematemesis 08/02/2016   Hx GERD, findings on 08/02/16 EGD: esophagitis LA grade B, ?underlying Barrett's esphagus; 4 cm HH; hemorrhagic gastritis and red blood in stomach (source or bleeding); NO VARICES OR PORTALGASTROPATHY.    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 Authorizing Provider  acetaminophen (TYLENOL) 500 MG tablet Take 1,000 mg by mouth daily as needed for mild pain, moderate pain, fever or headache.   Yes [provider]  escitalopram (LEXAPRO) 5 MG tablet Take 1 tablet (5 mg total) by mouth daily. Patient not taking: Reported on 04/29/2022 12/30/16   Denzil Magnusonhomas, Lashunda, NP  gabapentin (NEURONTIN) 400 MG capsule Take 1 capsule (400 mg total) by mouth 3 (three) times daily. Patient not taking: Reported on 04/29/2022 12/29/16   Denzil Magnusonhomas, Lashunda, NP  hydrOXYzine (ATARAX/VISTARIL) 25 MG tablet Take 1 tablet (25 mg total) by mouth every 6 (six) hours as needed for anxiety. Patient not taking: Reported on 04/29/2022 12/29/16   Denzil Magnusonhomas, Lashunda, NP  ondansetron (ZOFRAN-ODT) 4 MG disintegrating tablet Take 1 tablet (4 mg total) by mouth every 8 (eight) hours as needed for nausea or vomiting. 04/29/22   Lonell GrandchildScheving, Adith Tejada L, MD  pantoprazole (PROTONIX) 40 MG tablet Take 1 tablet (40 mg total) by mouth daily. 04/29/22   Lonell GrandchildScheving, Mateus Rewerts L, MD  QUEtiapine (SEROQUEL) 25 MG tablet Take 1 tablet (25 mg total) by mouth daily. Patient not taking:  Reported on 04/29/2022 12/30/16   Denzil Magnuson, NP  QUEtiapine (SEROQUEL) 50 MG tablet Take 1 tablet (50 mg total) by mouth at bedtime. Patient not taking: Reported on 04/29/2022 12/29/16   Denzil Magnuson, NP  traZODone (DESYREL) 100 MG tablet Take 1 tablet (100 mg total) by mouth at bedtime as needed for sleep. Patient not taking: Reported on 04/29/2022 12/29/16   Denzil Magnuson, NP                                                                                                                                     Past Surgical History Past Surgical History:  Procedure Laterality Date   ESOPHAGOGASTRODUODENOSCOPY (EGD) WITH PROPOFOL N/A 08/02/2016   Procedure: ESOPHAGOGASTRODUODENOSCOPY (EGD) WITH PROPOFOL;  Surgeon: Iva Boop, MD;  Location: [facility];  Service: Endoscopy;  Laterality: N/A;   ROTATOR CUFF REPAIR  1995   SHOULDER SURGERY     Family History Family History  Problem Relation Age of Onset   Heart failure Mother    Hypertension Other    Cancer Other     Social History Social History   Tobacco Use   Smoking status: Every Day    Packs/day: 0.50    Years: 15.00    Total pack years: 7.50    Types: Cigarettes   Smokeless tobacco: Current    Types: Chew  Vaping Use   Vaping Use: Never used  Substance Use Topics   Alcohol use: Not Currently    Comment: drank 1/2 gallon yesterday - rarely drinks r/t stomach ulcers usually   Drug use: Not Currently    Frequency: 7.0 times per week    Types: "Crack" cocaine, Cocaine, Hydrocodone, Oxycodone   Allergies Motrin [ibuprofen] and Vicodin [hydrocodone-acetaminophen]  Review of Systems Review of Systems  All other systems reviewed and are negative.   Physical Exam Vital Signs  I have reviewed the triage vital signs BP 105/68   Pulse 64   Temp 98.2 F (36.8 C) (Oral)   Resp 18   Ht 5\' 4"  (1.626 m)   Wt 65.8 kg   SpO2 100%   BMI 24.89 kg/m  Physical Exam Vitals and nursing note reviewed.  Constitutional:      General: He is not in acute distress.    Appearance: Normal appearance.  HENT:     Mouth/Throat:     Mouth: Mucous membranes are moist.  Eyes:     Conjunctiva/sclera: Conjunctivae normal.  Cardiovascular:     Rate and Rhythm: Normal rate and regular rhythm.  Pulmonary:     Effort: Pulmonary effort is normal. No respiratory distress.     Breath sounds: Normal breath sounds.  Abdominal:      General: Abdomen is flat.     Palpations: Abdomen is soft.     Tenderness: There is no abdominal tenderness.  Musculoskeletal:     Right lower leg: No edema.     Left  lower leg: No edema.  Skin:    General: Skin is warm and dry.     Capillary Refill: Capillary refill takes less than 2 seconds.  Neurological:     Mental Status: He is alert and oriented to person, place, and time. Mental status is at baseline.  Psychiatric:        Mood and Affect: Mood normal.        Behavior: Behavior normal.     ED Results and Treatments Labs (all labs ordered are listed, but only abnormal results are displayed) Labs Reviewed  COMPREHENSIVE METABOLIC PANEL - Abnormal; Notable for the following components:      Result Value   Total Protein 6.3 (*)    Total Bilirubin 0.2 (*)    All other components within normal limits  LIPASE, BLOOD  CBC  URINALYSIS, ROUTINE W REFLEX MICROSCOPIC                                                                                                                          Radiology CT Abdomen Pelvis W Contrast  Result Date: 04/28/2022 CLINICAL DATA:  Acute left flank pain. EXAM: CT ABDOMEN AND PELVIS WITH CONTRAST TECHNIQUE: Multidetector CT imaging of the abdomen and pelvis was performed using the standard protocol following bolus administration of intravenous contrast. RADIATION DOSE REDUCTION: This exam was performed according to the departmental dose-optimization program which includes automated exposure control, adjustment of the mA and/or kV according to patient size and/or use of iterative reconstruction technique. CONTRAST:  85mL OMNIPAQUE IOHEXOL 300 MG/ML  SOLN COMPARISON:  January 25, 2022. FINDINGS: Lower chest: No acute abnormality. Hepatobiliary: No cholelithiasis or biliary dilatation is noted. Stable right hepatic cyst. Pancreas: Unremarkable. No pancreatic ductal dilatation or surrounding inflammatory changes. Spleen: Normal in size without focal  abnormality. Adrenals/Urinary Tract: Adrenal glands are unremarkable. Kidneys are normal, without renal calculi, focal lesion, or hydronephrosis. Bladder is unremarkable. Stomach/Bowel: Stomach is within normal limits. Appendix appears normal. No evidence of bowel wall thickening, distention, or inflammatory changes. Vascular/Lymphatic: No significant vascular findings are present. No enlarged abdominal or pelvic lymph nodes. Reproductive: Prostate is unremarkable. Other: No abdominal wall hernia or abnormality. No abdominopelvic ascites. Musculoskeletal: No acute or significant osseous findings. IMPRESSION: No acute abnormality seen in the abdomen or pelvis. Electronically Signed   By: Lupita Raider M.D.   On: 04/28/2022 16:02    Pertinent labs & imaging results that were available during my care of the patient were reviewed by me and considered in my medical decision making (see MDM for details).  Medications Ordered in ED Medications  famotidine (PEPCID) 40 mg in sodium chloride 0.9 % 50 mL IVPB (40 mg Intravenous New Bag/Given 04/29/22 1330)  ondansetron (ZOFRAN-ODT) disintegrating tablet 4 mg (4 mg Oral Given 04/29/22 0925)  sodium chloride 0.9 % bolus 1,000 mL (1,000 mLs Intravenous New Bag/Given 04/29/22 1255)  droperidol (INAPSINE) 2.5 MG/ML injection 2.5 mg (2.5 mg Intravenous Given 04/29/22 1256)  Procedures Procedures  (including critical care time)  Medical Decision Making / ED Course   MDM:  46 year old male presenting to the emergency department with abdominal pain. Patient well-appearing, vital signs reassuring.  Abdominal exam without any focal findings.  Differential based on symptoms include small bowel obstruction, pancreatitis, perforation, obstruction, cholecystitis, appendicitis.  However, patient had CT scan yesterday, left without being seen  by provider.  I reviewed CT scan which is normal.  His pain is at the same as it was yesterday and also the same as it has been for the last week and a half.  Doubt repeat CT scan would be diagnostically useful.  His laboratory testing is also reassuring with no elevated WBC count, no signs of dehydration.  Given reassuring labs and imaging, treated with Pepcid,, fluids, droperidol.  Patient reports improvement in symptoms.  He also reports that he is not taking the Protonix he is supposed to be taking for his prior episode of pain.  Will refill his Pepcid prescription.  Will also prescribe Zofran for him to take at home. Will discharge patient to home. All questions answered. Patient comfortable with plan of discharge. Return precautions discussed with patient and specified on the after visit summary.       Additional history obtained: -Additional history obtained from spouse -External records from outside source obtained and reviewed including: Chart review including previous notes, labs, imaging, consultation notes including ED visit 9/23   Lab Tests: -I ordered, reviewed, and interpreted labs.   The pertinent results include:   Labs Reviewed  COMPREHENSIVE METABOLIC PANEL - Abnormal; Notable for the following components:      Result Value   Total Protein 6.3 (*)    Total Bilirubin 0.2 (*)    All other components within normal limits  LIPASE, BLOOD  CBC  URINALYSIS, ROUTINE W REFLEX MICROSCOPIC    Notable for no elevaed WBC Count, normal lipase/LFTs  Imaging Studies ordered: I reviewed CT scan performed yesterday,  I independently visualized and interpreted imaging. I agree with the radiologist interpretation   Medicines ordered and prescription drug management: Meds ordered this encounter  Medications   ondansetron (ZOFRAN-ODT) disintegrating tablet 4 mg   sodium chloride 0.9 % bolus 1,000 mL   famotidine (PEPCID) 40 mg in sodium chloride 0.9 % 50 mL IVPB   droperidol  (INAPSINE) 2.5 MG/ML injection 2.5 mg   ondansetron (ZOFRAN-ODT) 4 MG disintegrating tablet    Sig: Take 1 tablet (4 mg total) by mouth every 8 (eight) hours as needed for nausea or vomiting.    Dispense:  20 tablet    Refill:  0   pantoprazole (PROTONIX) 40 MG tablet    Sig: Take 1 tablet (40 mg total) by mouth daily.    Dispense:  30 tablet    Refill:  0    -I have reviewed the patients home medicines and have made adjustments as needed  =  Social Determinants of Health:  Diagnosis or treatment significantly limited by social determinants of health: polysubstance abuse   Reevaluation: After the interventions noted above, I reevaluated the patient and found that they have improved  Co morbidities that complicate the patient evaluation  Past Medical History:  Diagnosis Date   Asthma    Bilateral varicoceles 03/2015   also left hydrocele per ultrasound.    Bronchitis    Esophageal varices (HCC) 2003 vs 2008   pt reprted varices on EGD in [place] "10 to 15 years ago. No varices or portal gastropathy  on EGD 08/02/16   Hematemesis 08/02/2016   Hx GERD, findings on 08/02/16 EGD: esophagitis LA grade B, ?underlying Barrett's esphagus; 4 cm HH; hemorrhagic gastritis and red blood in stomach (source or bleeding); NO VARICES OR PORTALGASTROPATHY.    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    Final Clinical Impression(s) / ED Diagnoses Final diagnoses:  Generalized abdominal pain     This chart was dictated using voice recognition software.  Despite best efforts to proofread,  errors can occur which can change the documentation meaning.    Lonell Grandchild, MD 04/29/22 1356

## 2022-04-29 NOTE — ED Triage Notes (Signed)
Pt. Stated, Chris Randall had stomach pain like vise grips are around me. My back hurts and my left arm goes numb off and on . This all started about a week and half ago.

## 2022-04-29 NOTE — ED Provider Triage Note (Signed)
Emergency Medicine Provider Triage Evaluation Note  Chris Randall , a 46 y.o. male  was evaluated in triage.  Pt complains of low abdominal pain and back pain onset 1.5 weeks.  He notes that his pain feels similar to when he was evaluated yesterday via triage.  He notes that he left yesterday because his pain was severe.  Has associated nausea and vomiting.  Denies past medical history of kidney stones.  Denies EtOH use, marijuana use, excessive Tylenol or ibuprofen use.  Denies urinary symptoms.  Review of Systems  Positive:  Negative:   Physical Exam  BP (!) 127/95 (BP Location: Right Arm)   Pulse 75   Temp 98.1 F (36.7 C)   Resp 18   Ht 5\' 4"  (1.626 m)   Wt 65.8 kg   SpO2 100%   BMI 24.89 kg/m  Gen:   Awake, no distress   Resp:  Normal effort MSK:   Moves extremities without difficulty  Other:  Diffuse abdominal tenderness to palpation  Medical Decision Making  Medically screening exam initiated at 9:10 AM.  Appropriate orders placed.  was informed that the remainder of the evaluation will be completed by another provider, this initial triage assessment does not replace that evaluation, and the importance of remaining in the ED until their evaluation is complete.     Fraser Busche A, PA-C 04/29/22 [phone]

## 2023-05-26 ENCOUNTER — Other Ambulatory Visit: Payer: Self-pay

## 2023-05-26 ENCOUNTER — Emergency Department (HOSPITAL_COMMUNITY)
Admission: EM | Admit: 2023-05-26 | Discharge: 2023-05-26 | Discharge status: Against Medical Advice | Payer: 59 | Attending: Emergency Medicine | Admitting: Emergency Medicine

## 2023-05-26 DIAGNOSIS — Z5321 Procedure and treatment not carried out due to patient leaving prior to being seen by health care provider: Secondary | ICD-10-CM | POA: Diagnosis not present

## 2023-05-26 DIAGNOSIS — L84 Corns and callosities: Secondary | ICD-10-CM | POA: Diagnosis present

## 2023-05-26 NOTE — ED Notes (Signed)
 Pt said they had an emergency and needed to leave. Patient left the waiting room at 9:24 am.

## 2023-05-26 NOTE — ED Triage Notes (Addendum)
 Pt. Stated, I have a bad spot on my rt. Foot for 2 monts  and I also need some some Soboxone Its a callus area between big toe and 2nd toe.

## 2023-08-16 ENCOUNTER — Ambulatory Visit: Admitting: Physician Assistant

## 2023-08-18 ENCOUNTER — Ambulatory Visit: Admitting: Physician Assistant

## 2023-09-18 ENCOUNTER — Other Ambulatory Visit: Payer: Self-pay

## 2023-09-18 ENCOUNTER — Encounter (HOSPITAL_COMMUNITY): Payer: Self-pay

## 2023-09-18 ENCOUNTER — Emergency Department (HOSPITAL_COMMUNITY)
Admission: EM | Admit: 2023-09-18 | Discharge: 2023-09-19 | Disposition: A | Discharge status: Other Acute Inpatient Hospital | Attending: Emergency Medicine | Admitting: Emergency Medicine

## 2023-09-18 DIAGNOSIS — F39 Unspecified mood [affective] disorder: Secondary | ICD-10-CM | POA: Diagnosis present

## 2023-09-18 DIAGNOSIS — Z5902 Unsheltered homelessness: Secondary | ICD-10-CM | POA: Diagnosis not present

## 2023-09-18 DIAGNOSIS — R45851 Suicidal ideations: Secondary | ICD-10-CM

## 2023-09-18 DIAGNOSIS — F1910 Other psychoactive substance abuse, uncomplicated: Secondary | ICD-10-CM

## 2023-09-18 DIAGNOSIS — Z62819 Personal history of unspecified abuse in childhood: Secondary | ICD-10-CM | POA: Diagnosis not present

## 2023-09-18 DIAGNOSIS — F22 Delusional disorders: Secondary | ICD-10-CM | POA: Diagnosis not present

## 2023-09-18 DIAGNOSIS — F1520 Other stimulant dependence, uncomplicated: Secondary | ICD-10-CM | POA: Diagnosis present

## 2023-09-18 DIAGNOSIS — F1594 Other stimulant use, unspecified with stimulant-induced mood disorder: Secondary | ICD-10-CM | POA: Diagnosis present

## 2023-09-18 DIAGNOSIS — F1524 Other stimulant dependence with stimulant-induced mood disorder: Secondary | ICD-10-CM | POA: Diagnosis not present

## 2023-09-18 DIAGNOSIS — Z556 Problems related to health literacy: Secondary | ICD-10-CM | POA: Diagnosis not present

## 2023-09-18 DIAGNOSIS — F1120 Opioid dependence, uncomplicated: Secondary | ICD-10-CM | POA: Diagnosis not present

## 2023-09-18 LAB — CBC WITH DIFFERENTIAL/PLATELET
Abs Immature Granulocytes: 0.05 10*3/uL (ref 0.00–0.07)
Basophils Absolute: 0.1 10*3/uL (ref 0.0–0.1)
Basophils Relative: 1 %
Eosinophils Absolute: 0.6 10*3/uL — ABNORMAL HIGH (ref 0.0–0.5)
Eosinophils Relative: 5 %
HCT: 44.5 % (ref 39.0–52.0)
Hemoglobin: 14.4 g/dL (ref 13.0–17.0)
Immature Granulocytes: 0 %
Lymphocytes Relative: 13 %
Lymphs Abs: 1.6 10*3/uL (ref 0.7–4.0)
MCH: 28 pg (ref 26.0–34.0)
MCHC: 32.4 g/dL (ref 30.0–36.0)
MCV: 86.4 fL (ref 80.0–100.0)
Monocytes Absolute: 0.9 10*3/uL (ref 0.1–1.0)
Monocytes Relative: 7 %
Neutro Abs: 9.8 10*3/uL — ABNORMAL HIGH (ref 1.7–7.7)
Neutrophils Relative %: 74 %
Platelets: 281 10*3/uL (ref 150–400)
RBC: 5.15 MIL/uL (ref 4.22–5.81)
RDW: 13.3 % (ref 11.5–15.5)
WBC: 13.1 10*3/uL — ABNORMAL HIGH (ref 4.0–10.5)
nRBC: 0 % (ref 0.0–0.2)

## 2023-09-18 LAB — COMPREHENSIVE METABOLIC PANEL WITH GFR
ALT: 15 U/L (ref 0–44)
AST: 18 U/L (ref 15–41)
Albumin: 3.8 g/dL (ref 3.5–5.0)
Alkaline Phosphatase: 105 U/L (ref 38–126)
Anion gap: 8 (ref 5–15)
BUN: 6 mg/dL (ref 6–20)
CO2: 27 mmol/L (ref 22–32)
Calcium: 8.9 mg/dL (ref 8.9–10.3)
Chloride: 105 mmol/L (ref 98–111)
Creatinine, Ser: 0.85 mg/dL (ref 0.61–1.24)
GFR, Estimated: >60 mL/min (ref >60)
Glucose, Bld: 111 mg/dL — ABNORMAL HIGH (ref 70–99)
Potassium: 3.6 mmol/L (ref 3.5–5.1)
Sodium: 140 mmol/L (ref 135–145)
Total Bilirubin: 0.7 mg/dL (ref 0.0–1.2)
Total Protein: 6.6 g/dL (ref 6.5–8.1)

## 2023-09-18 LAB — RAPID URINE DRUG SCREEN, HOSP PERFORMED
Amphetamines: POSITIVE — AB
Barbiturates: NOT DETECTED
Benzodiazepines: NOT DETECTED
Cocaine: POSITIVE — AB
Opiates: NOT DETECTED
Tetrahydrocannabinol: POSITIVE — AB

## 2023-09-18 LAB — SALICYLATE LEVEL: Salicylate Lvl: <7 mg/dL — ABNORMAL LOW (ref 7.0–30.0)

## 2023-09-18 LAB — ACETAMINOPHEN LEVEL: Acetaminophen (Tylenol), Serum: <10 ug/mL — ABNORMAL LOW (ref 10–30)

## 2023-09-18 LAB — ETHANOL: Alcohol, Ethyl (B): <15 mg/dL (ref <15)

## 2023-09-18 MED ORDER — DIPHENHYDRAMINE HCL 25 MG PO CAPS
25.0000 mg | ORAL_CAPSULE | Freq: Once | ORAL | Status: AC
Start: 2023-09-18 — End: 2023-09-18
  Administered 2023-09-18: 25 mg via ORAL
  Filled 2023-09-18: qty 1

## 2023-09-18 NOTE — ED Provider Notes (Signed)
 [place] EMERGENCY DEPARTMENT AT [facility] Provider Note   CSN: [id number] Arrival date & time: 09/18/23  1719     History Chief Complaint  Patient presents with   Suicidal    Chris Randall is a 48 y.o. male.  48 y.o male with a PMH of substance abuse presents to the ED with a chief complaint of suicidal ideations.  Patient reports he was just released from being incarcerated, he was doing "a lot of meth with my twin sister ", reports he now has some suicidal ideations, he states "I cannot take anymore I am going to hurt myself ".  He has a complaint of cutting his wrist, he does her prior history and attempt of this.  In addition, he is endorsing possible abscesses under his right armpit, in his left groin.  He has attempted to drain these with some success. No fever, no nausea, no abdominal pain or chest pain.   The history is provided by the patient.       Home Medications Prior to Admission medications   Medication Sig Start Date End Date Taking? Authorizing Provider  acetaminophen  (TYLENOL ) 500 MG tablet Take 1,000 mg by mouth daily as needed for mild pain, moderate pain, fever or headache.    [provider]  escitalopram  (LEXAPRO ) 5 MG tablet Take 1 tablet (5 mg total) by mouth daily. Patient not taking: Reported on 04/29/2022 12/30/16   Thomas, Lashunda, NP  gabapentin  (NEURONTIN ) 400 MG capsule Take 1 capsule (400 mg total) by mouth 3 (three) times daily. Patient not taking: Reported on 04/29/2022 12/29/16   Thomas, Lashunda, NP  hydrOXYzine  (ATARAX /VISTARIL ) 25 MG tablet Take 1 tablet (25 mg total) by mouth every 6 (six) hours as needed for anxiety. Patient not taking: Reported on 04/29/2022 12/29/16   Cortez Dines, NP  ondansetron  (ZOFRAN -ODT) 4 MG disintegrating tablet Take 1 tablet (4 mg total) by mouth every 8 (eight) hours as needed for nausea or vomiting. 04/29/22   Mordecai Applebaum, MD  pantoprazole  (PROTONIX ) 40 MG tablet Take 1 tablet (40  mg total) by mouth daily. 04/29/22   Mordecai Applebaum, MD  QUEtiapine  (SEROQUEL ) 25 MG tablet Take 1 tablet (25 mg total) by mouth daily. Patient not taking: Reported on 04/29/2022 12/30/16   Thomas, Lashunda, NP  QUEtiapine  (SEROQUEL ) 50 MG tablet Take 1 tablet (50 mg total) by mouth at bedtime. Patient not taking: Reported on 04/29/2022 12/29/16   Thomas, Lashunda, NP  traZODone  (DESYREL ) 100 MG tablet Take 1 tablet (100 mg total) by mouth at bedtime as needed for sleep. Patient not taking: Reported on 04/29/2022 12/29/16   Thomas, Lashunda, NP      Allergies    Motrin  [ibuprofen ] and Vicodin [hydrocodone -acetaminophen ]    Review of Systems   Review of Systems  Constitutional:  Negative for fever.  Respiratory:  Negative for shortness of breath.   Cardiovascular:  Negative for chest pain.  Skin:  Positive for wound.  Psychiatric/Behavioral:  Positive for suicidal ideas.   All other systems reviewed and are negative.   Physical Exam Updated Vital Signs BP (!) 114/103   Pulse (!) 107   Temp 97.9 F (36.6 C) (Oral)   Resp 18   Ht 5\' 4"  (1.626 m)   Wt 72.6 kg   SpO2 99%   BMI 27.47 kg/m  Physical Exam Vitals and nursing note reviewed.  Constitutional:      Appearance: Normal appearance.  HENT:     Head: Normocephalic.  Mouth/Throat:     Mouth: Mucous membranes are moist.  Cardiovascular:     Rate and Rhythm: Normal rate.  Pulmonary:     Effort: Pulmonary effort is normal.  Abdominal:     General: Abdomen is flat.     Tenderness: There is no abdominal tenderness.  Musculoskeletal:     Cervical back: Normal range of motion and neck supple.  Skin:    General: Skin is warm and dry.     Findings: Erythema present.  Neurological:     Mental Status: He is alert and oriented to person, place, and time.  Psychiatric:        Speech: Speech normal.        Behavior: Behavior is cooperative.        Thought Content: Thought content includes suicidal ideation.     ED Results  / Procedures / Treatments   Labs (all labs ordered are listed, but only abnormal results are displayed) Labs Reviewed  CBC WITH DIFFERENTIAL/PLATELET - Abnormal; Notable for the following components:      Result Value   WBC 13.1 (*)    Neutro Abs 9.8 (*)    Eosinophils Absolute 0.6 (*)    All other components within normal limits  COMPREHENSIVE METABOLIC PANEL WITH GFR - Abnormal; Notable for the following components:   Glucose, Bld 111 (*)    All other components within normal limits  SALICYLATE LEVEL - Abnormal; Notable for the following components:   Salicylate Lvl <7.0 (*)    All other components within normal limits  ACETAMINOPHEN  LEVEL - Abnormal; Notable for the following components:   Acetaminophen  (Tylenol ), Serum <10 (*)    All other components within normal limits  RAPID URINE DRUG SCREEN, HOSP PERFORMED - Abnormal; Notable for the following components:   Cocaine POSITIVE (*)    Amphetamines POSITIVE (*)    Tetrahydrocannabinol POSITIVE (*)    All other components within normal limits  ETHANOL    EKG None  Radiology No results found.  Procedures Procedures    Medications Ordered in ED Medications - No data to display  ED Course/ Medical Decision Making/ A&P Clinical Course as of 09/18/23 1917  Sun Sep 18, 2023  1903 COCAINE(!): POSITIVE [JS]  1903 Amphetamines(!): POSITIVE [JS]  1903 Tetrahydrocannabinol(!): POSITIVE [JS]    Clinical Course User Index [JS] Srinika Delone, PA-C                                 Medical Decision Making Amount and/or Complexity of Data Reviewed Labs: ordered. Decision-making details documented in ED Course.    Patient presents the ED with suicidal ideations, reports he was released from being incarcerated, began to do some meth with his sister, now he feels suicidal, reports he has a plan of cutting his wrist.  On evaluation there is multiple abscesses consistent with likely folliculitis under his right armpit, these are  actively draining during my evaluation.  There is another wound to his left thigh, this appear erythematous without any active drainage as well.  He is overall well-appearing, nontoxic.  Hypertensive on arrival.  Interpretation of his labs by me reveal a CMP with no electrolyte derangement, creatinine levels unremarkable.  LFTs are within normal limits.  CBC remarkable for slight leukocytosis of 13.1, hemoglobin is within normal limits.  Ethanol level is normal.  Acetaminophen , salicylate levels are normal.  UDS is positive for cocaine, feta means, THC.  Patient  is overall nontoxic-appearing, eating a sandwich, cooperative, I do feel that he is medically cleared to be evaluated by psychiatry at this time.  TTS consultation has been ordered.    Portions of this note were generated with Scientist, clinical (histocompatibility and immunogenetics). Dictation errors may occur despite best attempts at proofreading.   Final Clinical Impression(s) / ED Diagnoses Final diagnoses:  Suicidal ideation  Substance abuse [facility])    Rx / DC Orders ED Discharge Orders     None         Alexzia Kasler, PA-C 09/18/23 1917    Lind Repine, MD 09/21/23 2039

## 2023-09-18 NOTE — BH Assessment (Signed)
 TTS Consult will be completed by IRIS. IRIS Coordinator will communicate in established secure chat assessment time and provider name. Thanks

## 2023-09-18 NOTE — ED Triage Notes (Signed)
 Pt states that he wants to detox from meth and heroin last use 2 days ago. Pt states that he is now suicidal and has a hx of cutting his wrists. Pt states that he doesn't want to live this life anymore.

## 2023-09-18 NOTE — ED Notes (Signed)
 Pt wanded by security, belongings placed in [place] [facility] cabinets (1 bag with pt sticker). Pt still has on a silver ring on left finger, pt states that it can't come off.

## 2023-09-18 NOTE — ED Notes (Signed)
 Provided pt a sausage biscuit and sprite. Pt reports that he has a possible axillary abscess in his right armpit and an area of irritation on bilateral thighs/groin area x several days. MD notified.

## 2023-09-19 ENCOUNTER — Other Ambulatory Visit (HOSPITAL_COMMUNITY)
Admission: EM | Admit: 2023-09-19 | Discharge: 2023-09-22 | Disposition: A | Discharge status: Home / Self Care | Source: Home / Self Care | Attending: Psychiatry | Admitting: Psychiatry

## 2023-09-19 DIAGNOSIS — F22 Delusional disorders: Secondary | ICD-10-CM | POA: Insufficient documentation

## 2023-09-19 DIAGNOSIS — F39 Unspecified mood [affective] disorder: Secondary | ICD-10-CM | POA: Diagnosis not present

## 2023-09-19 DIAGNOSIS — F1594 Other stimulant use, unspecified with stimulant-induced mood disorder: Secondary | ICD-10-CM | POA: Diagnosis present

## 2023-09-19 DIAGNOSIS — R21 Rash and other nonspecific skin eruption: Secondary | ICD-10-CM | POA: Diagnosis present

## 2023-09-19 DIAGNOSIS — F1520 Other stimulant dependence, uncomplicated: Secondary | ICD-10-CM | POA: Diagnosis not present

## 2023-09-19 DIAGNOSIS — Z87898 Personal history of other specified conditions: Secondary | ICD-10-CM

## 2023-09-19 DIAGNOSIS — F1120 Opioid dependence, uncomplicated: Secondary | ICD-10-CM | POA: Insufficient documentation

## 2023-09-19 DIAGNOSIS — F1524 Other stimulant dependence with stimulant-induced mood disorder: Secondary | ICD-10-CM | POA: Insufficient documentation

## 2023-09-19 LAB — SARS CORONAVIRUS 2 BY RT PCR: SARS Coronavirus 2 by RT PCR: NEGATIVE

## 2023-09-19 MED ORDER — DIPHENHYDRAMINE HCL 50 MG/ML IJ SOLN: 50.0000 mg | Freq: Three times a day (TID) | INTRAMUSCULAR | Status: DC | PRN

## 2023-09-19 MED ORDER — BENZTROPINE MESYLATE 1 MG PO TABS: 1.0000 mg | ORAL_TABLET | Freq: Two times a day (BID) | ORAL | Status: DC | PRN

## 2023-09-19 MED ORDER — FAMOTIDINE 20 MG PO TABS
40.0000 mg | ORAL_TABLET | Freq: Once | ORAL | Status: AC
  Administered 2023-09-19: 40 mg via ORAL
  Filled 2023-09-19: qty 2

## 2023-09-19 MED ORDER — DIPHENHYDRAMINE HCL 50 MG/ML IJ SOLN: 50.0000 mg | Freq: Four times a day (QID) | INTRAMUSCULAR | Status: DC | PRN

## 2023-09-19 MED ORDER — ZIPRASIDONE MESYLATE 20 MG IM SOLR: 10.0000 mg | Freq: Four times a day (QID) | INTRAMUSCULAR | Status: DC | PRN

## 2023-09-19 MED ORDER — BUPRENORPHINE HCL-NALOXONE HCL 2-0.5 MG SL SUBL
1.0000 | SUBLINGUAL_TABLET | Freq: Every day | SUBLINGUAL | Status: DC
  Administered 2023-09-19: 1 via SUBLINGUAL
  Filled 2023-09-19: qty 1

## 2023-09-19 MED ORDER — QUETIAPINE FUMARATE 50 MG PO TABS
50.0000 mg | ORAL_TABLET | Freq: Every day | ORAL | Status: DC
  Administered 2023-09-19 – 2023-09-21 (×3): 50 mg via ORAL
  Filled 2023-09-19 (×3): qty 1

## 2023-09-19 MED ORDER — QUETIAPINE FUMARATE 50 MG PO TABS
50.0000 mg | ORAL_TABLET | Freq: Every day | ORAL | Status: DC
  Administered 2023-09-19: 50 mg via ORAL
  Filled 2023-09-19: qty 1

## 2023-09-19 MED ORDER — ALUM & MAG HYDROXIDE-SIMETH 200-200-20 MG/5ML PO SUSP
30.0000 mL | ORAL | Status: DC | PRN
  Administered 2023-09-21 (×2): 30 mL via ORAL
  Filled 2023-09-19 (×2): qty 30

## 2023-09-19 MED ORDER — ALUM & MAG HYDROXIDE-SIMETH 200-200-20 MG/5ML PO SUSP
30.0000 mL | Freq: Once | ORAL | Status: AC
  Administered 2023-09-19: 30 mL via ORAL
  Filled 2023-09-19: qty 30

## 2023-09-19 MED ORDER — LORAZEPAM 2 MG/ML IJ SOLN: 2.0000 mg | Freq: Four times a day (QID) | INTRAMUSCULAR | Status: DC | PRN

## 2023-09-19 MED ORDER — BENZTROPINE MESYLATE 1 MG/ML IJ SOLN: 1.0000 mg | Freq: Two times a day (BID) | INTRAMUSCULAR | Status: DC | PRN

## 2023-09-19 MED ORDER — HALOPERIDOL LACTATE 5 MG/ML IJ SOLN: 10.0000 mg | Freq: Three times a day (TID) | INTRAMUSCULAR | Status: DC | PRN

## 2023-09-19 MED ORDER — HYDROXYZINE HCL 25 MG PO TABS: 50.0000 mg | ORAL_TABLET | Freq: Four times a day (QID) | ORAL | Status: DC | PRN

## 2023-09-19 MED ORDER — LORAZEPAM 2 MG/ML IJ SOLN: 2.0000 mg | Freq: Three times a day (TID) | INTRAMUSCULAR | Status: DC | PRN

## 2023-09-19 MED ORDER — HALOPERIDOL LACTATE 5 MG/ML IJ SOLN
5.0000 mg | Freq: Three times a day (TID) | INTRAMUSCULAR | Status: DC | PRN
Start: 2023-09-19 — End: 2023-09-22

## 2023-09-19 MED ORDER — HALOPERIDOL 5 MG PO TABS: 5.0000 mg | ORAL_TABLET | Freq: Three times a day (TID) | ORAL | Status: DC | PRN

## 2023-09-19 MED ORDER — BENZTROPINE MESYLATE 0.5 MG PO TABS
1.0000 mg | ORAL_TABLET | Freq: Two times a day (BID) | ORAL | Status: DC | PRN
Start: 2023-09-19 — End: 2023-09-19

## 2023-09-19 MED ORDER — DIPHENHYDRAMINE HCL 50 MG PO CAPS: 50.0000 mg | ORAL_CAPSULE | Freq: Three times a day (TID) | ORAL | Status: DC | PRN

## 2023-09-19 MED ORDER — BUPRENORPHINE HCL-NALOXONE HCL 2-0.5 MG SL SUBL
1.0000 | SUBLINGUAL_TABLET | Freq: Every day | SUBLINGUAL | Status: DC
  Administered 2023-09-20 – 2023-09-22 (×3): 1 via SUBLINGUAL
  Filled 2023-09-19 (×3): qty 1
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
## 2023-09-19 NOTE — ED Notes (Signed)
TTS consult in progress. °

## 2023-09-19 NOTE — ED Provider Notes (Signed)
 Emergency Medicine Observation Re-evaluation Note  Chris Randall is a 48 y.o. male, seen on rounds today.  Pt initially presented to the ED for complaints of Suicidal Currently, the patient is sleeping.  Physical Exam  BP 111/74 (BP Location: Right Arm)   Pulse 73   Temp 98.1 F (36.7 C) (Oral)   Resp 16   Ht 5\' 4"  (1.626 m)   Wt 72.6 kg   SpO2 98%   BMI 27.47 kg/m  Physical Exam General: Sleeping Cardiac: Extremities well-perfused Lungs: Breathing is unlabored Psych: Deferred  ED Course / MDM  EKG:   I have reviewed the labs performed to date as well as medications administered while in observation.  Recent changes in the last 24 hours include presentation to the emergency department yesterday for suicidal ideation.  He endorsed suicidal ideations in the setting of methamphetamine use.  He reports plan of cutting his wrist.  He has been medically cleared.  TTS recommends inpatient psychiatric admission.  At this time, he is voluntary.  TTS recommends IVC proceedings if he were to change his mind and request to leave.  Medication recommendations are the following: Suboxone, 2mg -0.5 mg every day for opioid dependence.  PRN's:  Hydroxyzine , 50 mg po q6h PRN mild/moderate anxiety; For severe agitation/aggression:  Geodon, 10 mg IM q6h PRN and Ativan , 2 mg IM q6h PRN and Benadryl , 50 mg IM q6h PRN; For EPS, Cogentin 1 mg po/IM bid PRN   Plan  Current plan is for inpatient psychiatric admission.    Iva Mariner, MD 09/19/23 [phone]

## 2023-09-19 NOTE — ED Notes (Signed)
 RN called over [facility] s/w Odilia Bennett and gave report

## 2023-09-19 NOTE — ED Notes (Signed)
 Safe transport called to have patient transported to [address] [place], [place]

## 2023-09-19 NOTE — Group Note (Signed)
 Group Topic: Healthy Self Image and Positive Change  Group Date: 09/19/2023 Start Time: 0930 End Time: 1015 Facilitators: Rozetta Corns  Department: [facility]  Number of Participants: 6  Group Focus: self-awareness Treatment Modality:  Psychoeducation Interventions utilized were patient education Purpose: increase insight  Name: Chris Randall Date of Birth: Aug 03, 1975  MR: [id number]    Level of Participation: Patient was not on unit at time of group Quality of Participation: n/a Interactions with others: n/a Mood/Affect: n/a Triggers (if applicable): n/a Cognition: n/a Progress: Other Response: n/a Plan: n/a  Patients Problems:  Patient Active Problem List   Diagnosis Date Noted   Mood disorder (HCC) 09/19/2023   Severe stimulant use disorder (HCC) 09/19/2023   Stimulant-induced mood disorder (HCC) 09/19/2023   Opioid use disorder, severe, dependence (HCC) 12/29/2016   Cocaine use disorder, mild, abuse (HCC) 12/29/2016   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

## 2023-09-19 NOTE — Group Note (Signed)
 Group Topic: Positive Affirmations  Group Date: 09/19/2023 Start Time: 2000 End Time: 2030 Facilitators: Bonnie Butters  Department: [facility]  Number of Participants: 5  Group Focus: check in Treatment Modality:  Skills Training Interventions utilized were support Purpose: reinforce self-care  Name: Chris Randall Date of Birth: 12-27-1975  MR: [id number]    Level of Participation: Pt did not attend Quality of Participation:  Interactions with others:  Mood/Affect:  Triggers (if applicable):  Cognition:  Progress:  Response:  Plan:   Patients Problems:  Patient Active Problem List   Diagnosis Date Noted   Mood disorder (HCC) 09/19/2023   Severe stimulant use disorder (HCC) 09/19/2023   Stimulant-induced mood disorder (HCC) 09/19/2023   Opioid use disorder, severe, dependence (HCC) 12/29/2016   Cocaine use disorder, mild, abuse (HCC) 12/29/2016   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

## 2023-09-19 NOTE — ED Notes (Signed)
 Patient in the bedroom asleep with eyes closed. NAD. Respirations are even and unlabored. Environment secured per policy. Will monitoring for safety

## 2023-09-19 NOTE — Progress Notes (Signed)
 Pt has been accepted to FACILITY BASED CRISIS. ADDRESS [address] [place]. pt must sign a voluntary consent to admission and fax that to [phone] and send original Bed assignment: 158  Pt meets inpatient criteria per: Arvell Latin NP  Attending Physician will be: Wayna Hails MD   Report can be called to: [phone].  Pt can arrive after Discharges/ CONSENT"S   Care Team Notified: [facility]  Bevin Bucks RN, Patt Boozer NT, Arvell Latin NP, Velora Ghent, [place] Ward RN  [place] Correne Lalani LCSW-A   09/19/2023 1:45 PM

## 2023-09-19 NOTE — ED Notes (Signed)
 Patient is in the bedroom calm and sleeping with eyes closed.NAD. Environment secured and safe. Will keep monitoring for safety.

## 2023-09-20 ENCOUNTER — Encounter (HOSPITAL_COMMUNITY): Payer: Self-pay | Admitting: Nurse Practitioner

## 2023-09-20 DIAGNOSIS — Z87898 Personal history of other specified conditions: Secondary | ICD-10-CM

## 2023-09-20 DIAGNOSIS — R21 Rash and other nonspecific skin eruption: Secondary | ICD-10-CM | POA: Diagnosis present

## 2023-09-20 DIAGNOSIS — Z550 Illiteracy and low-level literacy: Secondary | ICD-10-CM | POA: Insufficient documentation

## 2023-09-20 MED ORDER — HYDROCORTISONE 1 % EX CREA
TOPICAL_CREAM | Freq: Two times a day (BID) | CUTANEOUS | Status: DC
  Administered 2023-09-20 – 2023-09-22 (×2): 1 via TOPICAL
  Filled 2023-09-20: qty 28

## 2023-09-20 MED ORDER — NICOTINE 14 MG/24HR TD PT24
14.0000 mg | MEDICATED_PATCH | Freq: Every day | TRANSDERMAL | Status: DC
  Administered 2023-09-20 – 2023-09-22 (×3): 14 mg via TRANSDERMAL
  Filled 2023-09-20 (×3): qty 1

## 2023-09-20 NOTE — ED Notes (Signed)
 Patient remains asleep in bed without issue or complaint.  No distress.  Will monitor.

## 2023-09-20 NOTE — ED Notes (Signed)
 Pt is in the dayroom watching TV with peers. Pt denies SI/HI/AVH. Pt has no further complain.No acute distress noted. Will continue to monitor for safety and provide support.

## 2023-09-20 NOTE — Group Note (Signed)
 Group Topic: Social Support  Group Date: 09/20/2023 Start Time: 1030 End Time: 1110 Facilitators: Dennis Fitting, NT  Department: [facility]  Number of Participants: 5  Group Focus: problem solving and social skills Treatment Modality:  Psychoeducation Interventions utilized were problem solving and support Purpose: explore maladaptive thinking and improve communication skills  Name: Chris Randall Date of Birth: 1975/07/27  MR: [id number]    Level of Participation: PT did not attend group Quality of Participation: N/A Interactions with others: N/A Mood/Affect: N/A Triggers (if applicable): N/A Cognition: N/A Progress: None Response: Patient did not attend group Plan: patient will be encouraged to join groups  Patients Problems:  Patient Active Problem List   Diagnosis Date Noted   Rash and nonspecific skin eruption 09/20/2023   History of learning disability 09/20/2023   Limited literacy 09/20/2023   Mood disorder (HCC) 09/19/2023   Severe stimulant use disorder (HCC) 09/19/2023   Stimulant-induced mood disorder (HCC) 09/19/2023   Opioid use disorder, severe, dependence (HCC) 12/29/2016   Cocaine use disorder, mild, abuse (HCC) 12/29/2016   

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
## 2023-09-20 NOTE — ED Notes (Addendum)
 Patient is sleeping. Respirations equal and unlabored, skin warm and dry. No change in assessment or acuity. Routine safety checks conducted according to facility protocol. Will continue to monitor for safety.

## 2023-09-20 NOTE — Group Note (Signed)
 Group Topic: Wellness  Group Date: 09/20/2023 Start Time: 1230 End Time: 1400 Facilitators: Arlan Belling, RN  Department: [facility]  Number of Participants: 9  Group Focus: community group Treatment Modality:  Skills Training Interventions utilized were clarification, group exercise, leisure development, patient education, problem solving, and story telling Purpose: enhance coping skills, explore maladaptive thinking, express feelings, express irrational fears, improve communication skills, increase insight, regain self-worth, and reinforce self-care  Name: Chris Randall Date of Birth: 1976-02-23  MR: [id number]    Level of Participation: did not participate Quality of Participation:  Interactions with others:  Mood/Affect:  Triggers (if applicable):  Cognition:  Progress:  Response:  Plan:   Patients Problems:  Patient Active Problem List   Diagnosis Date Noted   Rash and nonspecific skin eruption 09/20/2023   History of learning disability 09/20/2023   Limited literacy 09/20/2023   Mood disorder (HCC) 09/19/2023   Severe stimulant use disorder (HCC) 09/19/2023   Stimulant-induced mood disorder (HCC) 09/19/2023   Opioid use disorder, severe, dependence (HCC) 12/29/2016   Cocaine use disorder, mild, abuse (HCC) 12/29/2016   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

## 2023-09-20 NOTE — Discharge Planning (Signed)
 LCSW met with pt at bedside for assessment of current state and disposition plans. Pt was voluntarily admitted to [facility] for detox and suicidal ideations. He was alert and oriented X 3 with appropriate mood and affect. Pt stated he has been homeless for several months following recent incarceration for approximately 3 years. He reported primary use of methamphetamines and with last use 4-5 days ago. He stated the longest he was sober for was approximately 4-5 months following successful completion of residential program at [facility] and then transitioned directly to [facility]. Pt stated he wanted to return to [facility] once his is detoxed. This LCSW made referral to [facility] and will f/u with Moira Andrews tomorrow to confirm review of referral. Will continue to follow.

## 2023-09-20 NOTE — ED Notes (Signed)
 Patient is in the bedroom sleeping  NAD. Respirations are even and unlabored. Environment secured per policy. Will monitoring for safety

## 2023-09-21 NOTE — Group Note (Signed)
 Group Topic: Wellness  Group Date: 09/21/2023 Start Time: 1000 End Time: 1010 Facilitators: Pennie Box, RN  Department: [facility]  Number of Participants: 1  Group Focus: check in Treatment Modality:  Individual Therapy Interventions utilized were patient education Purpose: increase insight  Name: Chris Randall Date of Birth: May 14, 1976  MR: [id number]    Level of Participation: active Quality of Participation: cooperative Interactions with others: gave feedback Mood/Affect: anxious Triggers (if applicable): medication dosing Cognition: coherent/clear Progress: Gaining insight Response: pt expressed understanding Plan: patient will be encouraged to attend future groups   Patients Problems:  Patient Active Problem List   Diagnosis Date Noted   Rash and nonspecific skin eruption 09/20/2023   History of learning disability 09/20/2023   Limited literacy 09/20/2023   Mood disorder (HCC) 09/19/2023   Severe stimulant use disorder (HCC) 09/19/2023   Stimulant-induced mood disorder (HCC) 09/19/2023   Opioid use disorder, severe, dependence (HCC) 12/29/2016   Cocaine use disorder, mild, abuse (HCC) 12/29/2016   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

## 2023-09-21 NOTE — ED Notes (Signed)
 Pt denies si hi avh. Pt reported concern about post discharge placement, RN discussed concern with treatment team and SW. Pt administered medications including topical for 'bumps'. Pt denied pain and other discomforts. Pt currently making phone call.

## 2023-09-21 NOTE — ED Notes (Signed)
 Patient is sleeping. Respirations equal and unlabored, skin warm and dry. No change in assessment or acuity. Routine safety checks conducted according to facility protocol. Will continue to monitor for safety.

## 2023-09-21 NOTE — ED Notes (Signed)
 Medication follow up- pt reports that prn Maalox was effective in relieving heartburn symptoms.

## 2023-09-21 NOTE — ED Notes (Signed)
 Pt was provided lunch

## 2023-09-21 NOTE — Group Note (Signed)
 Group Topic: Relaxation  Group Date: 09/21/2023 Start Time: 1200 End Time: 1230 Facilitators: Chipper Council, NT  Department: [facility]  Number of Participants: 2  Group Focus: check in, clarity of thought, and daily focus Treatment Modality:  Psychoeducation Interventions utilized were exploration, patient education, and support Purpose: increase insight  Name: Chris Randall Date of Birth: January 04, 1976  MR: [id number]    Level of Participation: Pt did not attend group Patients Problems:  Patient Active Problem List   Diagnosis Date Noted   Rash and nonspecific skin eruption 09/20/2023   History of learning disability 09/20/2023   Limited literacy 09/20/2023   Mood disorder (HCC) 09/19/2023   Severe stimulant use disorder (HCC) 09/19/2023   Stimulant-induced mood disorder (HCC) 09/19/2023   Opioid use disorder, severe, dependence (HCC) 12/29/2016   Cocaine use disorder, mild, abuse (HCC) 12/29/2016   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

## 2023-09-21 NOTE — ED Notes (Signed)
 Pt walked to the nursing station requesting for medication for heartburns.
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
## 2023-09-21 NOTE — Group Note (Signed)
 Group Topic: Change and Accountability  Group Date: 09/21/2023 Start Time: 2000 End Time: 2046 Facilitators: Wendall Halls B  Department: [facility]  Number of Participants: 4  Group Focus: abuse issues, acceptance, activities of daily living skills, check in, and community group Treatment Modality:  Interpersonal Therapy Interventions utilized were leisure development Purpose: enhance coping skills, express feelings, improve communication skills, increase insight, regain self-worth, reinforce self-care, relapse prevention strategies, and trigger / craving management  Name: Chris Randall Date of Birth: Sep 24, 1975  MR: [id number]    Level of Participation: active Quality of Participation: attentive Interactions with others: gave feedback Mood/Affect: appropriate Triggers (if applicable): NA Cognition: coherent/clear Progress: Gaining insight Response: NA Plan: patient will be encouraged to keep going to groups.   Patients Problems:  Patient Active Problem List   Diagnosis Date Noted   Rash and nonspecific skin eruption 09/20/2023   History of learning disability 09/20/2023   Limited literacy 09/20/2023   Mood disorder (HCC) 09/19/2023   Severe stimulant use disorder (HCC) 09/19/2023   Stimulant-induced mood disorder (HCC) 09/19/2023   Opioid use disorder, severe, dependence (HCC) 12/29/2016   Cocaine use disorder, mild, abuse (HCC) 12/29/2016   Substance or medication-induced bipolar and related disorder with onset during intoxication (HCC) 12/29/2016   GERD (gastroesophageal reflux disease) 12/29/2016   Suicidal ideation 12/26/2016   Substance induced mood disorder (HCC) 12/26/2016   Alcohol abuse with physiological dependence (HCC) 12/26/2016   Hepatitis C    Hematemesis/vomiting blood 08/02/2016   Abdominal pain 08/02/2016   Tobacco abuse 08/02/2016   Asthma 08/02/2016   Esophageal varices determined by endoscopy (HCC) 08/02/2016    Abnormal transaminases    Erosive esophagitis    Erosive gastritis with hemorrhage

## 2023-09-21 NOTE — Discharge Planning (Signed)
 LCSW spoke with Moira Andrews at [facility] who explained that they reviewed patients clinicals and felt that his mental health preceded his need for residential treatment and they could not offer a bed for him. She offered to have the clinician who reviewed his case speak to me and was transferred to her voice mail and left a message. Discussed this with the patient and he stated he just spoke with them a few days ago and they told him he could return. LCSW encouraged him to call them and discuss for himself and he did and reported to SW their findings concerning his mental health concerns. He agreed for this SW to resend his clinicals for referrals to other residential treatment centers. SW faxed [facility] to [facility], [facility] and will call back for follow up on referrals. Will continue to follow and provide supportive counseling as indicated.

## 2023-09-21 NOTE — ED Notes (Signed)
 Pt was provided dinner.

## 2023-09-22 MED ORDER — QUETIAPINE FUMARATE 50 MG PO TABS: 50.0000 mg | ORAL_TABLET | Freq: Every day | ORAL | 0 refills | Status: DC

## 2023-09-22 MED ORDER — BUPRENORPHINE HCL-NALOXONE HCL 2-0.5 MG SL SUBL
1.0000 | SUBLINGUAL_TABLET | Freq: Every day | SUBLINGUAL | Status: DC
Start: 2023-09-23 — End: 2023-10-13

## 2023-09-22 MED ORDER — HYDROCORTISONE 1 % EX CREA: TOPICAL_CREAM | Freq: Two times a day (BID) | CUTANEOUS | 0 refills | Status: DC

## 2023-09-22 NOTE — ED Notes (Signed)
Patient is sleeping. Respirations equal and unlabored, skin warm and dry, NAD. No change in assessment or acuity. Routine safety checks conducted according to facility protocol. Will continue to monitor for safety.   

## 2023-09-22 NOTE — Discharge Planning (Signed)
 Pt met with LCSW to make aware he has decided to discharge home and will follow up with [facility] for residential treatment at another time. He stated that he goes to [facility] for medication management and can follow up with outpatient services there as well if he changes his mind. He has no transportation to a random location provided and will be provided with a bus pass and voiced agreement and appreciation.

## 2023-09-22 NOTE — Discharge Instructions (Addendum)
 .. Substance Abuse Resources     SUBSTANCE USE TREATMENT for Medicaid and State Funded/IPRS  [facility] [address], [place], [address] [phone] phone NOTE: [facility] is no longer offering IOP services.  Serves those who are low-income or have no insurance.  [facility] [address] [place], [place], [address] [phone] phone [phone] fax NOTE: Does have Substance Abuse-Intensive Outpatient Program [facility]) as well as transitional housing if eligible.  [facility] [address] [place], [place], [address] [phone] phone [phone] fax  [facility] [phone] [place], [place], [address] [phone] phone [phone] fax    HALFWAY HOUSES:  [facility] [phone]  [email or web address]     12 STEP PROGRAMS:  Alcoholics Anonymous of [place] SoftwareChalet.be  Narcotics Anonymous of [place] HitProtect.dk  Al-Anon of [place], [place] www.[place]alanon.org/find-meetings.html  Nar-Anon https://nar-anon.org/find-a-meetin      List of Residential placements:   [facility] in [place]: [phone]  [facility]: [phone]  [place], [place] [phone]: Male and male facility; 30-day program: (uninsured and Medicaid such as Cheryll Corti, [place], [place], partners)  [facility]: [phone]; men and women's facility; 28 days; Can have Medicaid tailored plan Tour manager or Partners)  [facility]: [phone] Milda Aline or Tanis Fan; 28 day program; must be fully detox; tailored Medicaid or no insurance  [address] in [place], [place]; [phone]; 28 day all males program; no insurance accepted  [facility] Referral in [place]: Asa Lauth [phone] (no insurance or Medicaid only); 90 days; outpatient services but provide housing in apartments  downtown [facility]  [facility] Admission: [phone]: Patient must complete phone screening for placement: [place], [place]; 6 month program; uninsured, Medicaid, and [employer].   [facility]: no insurance required; [phone]  [facility]: [phone]; Intake: Porfirio Bristol; Must fill out application online; Charlcie Conger Delay [phone] x [address] [facility] in [place], [place]: [phone]; Admissions Coordinators Mr. Cornel Diesel or Carolynne Citron; 90 day program.  [facility]: [place], [place] [phone]; Co-Ed 9 month to a year program; Online application; Men entry fee is $500 (6-28months);  [employer]: [address] [place], [place] [address]; no fee or insurance required; minimum of 2 years; Highly structured; work based; Intake Coordinator is Larinda Plover [phone]  [facility] in [place], [place]: [phone]; Fax number is [phone]; website: www.[facility].org; Requires 3-6 page autobiography; 2 year program (18 months and then 28month transitional housing); Admission fee is $300; no insurance needed; work Automotive engineer in [place], [place]: [employer] Desk Staff: Annalee Barren [phone]: They have a Men's Regenerations Program 6-40months. Free program; There is an initial $300 fee however, they are willing to work with patients regarding that. Application is online.  [facility]: Admissions [phone] Almeta Arm ext [phone]; Any 7-90 day program is out of pocket; 12 month program is free of charge; there is a $275 entry fee; Patient is responsible for own transportation    .[facility]  [facility] [address] [place], [place], [address] [phone] phone  Offers food and emergency or transitional housing to men, women, or families in need. Clients participate in programs and workshops developed to promote self-sufficiency and personal development.Call or walk in. Applications are accepted Monday, Wednesday,  and Friday by appointment only. Need photo ID and proof of income.  [facility] [address], [place], [place] [address] [phone] Population served: Adult men & women (5 years old  and older, able to perform activities for daily living) Documents required: Valid ID & Social Security Card  [facility] [address] [place], [place]  [address] [phone] Population served: Families with children  [facility] [address] [place], [place]  [address] [phone] Population served: Single women 18+ without dependents Documents required:  Valid ID & Social Security Card  [facility] [address], [place], [place]  [address] [phone] Population served: Male veterans 18+ with substance abuse/mental health issues Eligibility: By referral only  [facility] [address], [place], [place] [address] [phone] Population served: Males 18+ Documents required: Valid ID & Social Security Card  [facility] at [employer] [facility] [place], [employer]. [address] [place] [address] [phone] or [phone] Population served: Pregnant women with or without children  Documents required: Valid ID & Social Security Ship broker of [employer] [address] [place], [place] [address] [phone] Population Served: Families with children  [facility] [address] [place], [place] [address] [phone] Population served: Men 18+, preference for disabled and/or veterans Eligibility: By referral only  [facility]) - [facility] [address] [place], [place], [place] [address] [phone] or [phone] Population served: Families with children.    WOMEN ONLY  The Shelter serves up to 20 women each night. Open from December 11th through the end of March in the  evenings from 5:30 pm until 7:30 am, the Shelter provides a hot evening meal, shower and sleeping facilities, and food for the next day. Secure parking is available beside the building.     Shelter Address   Directions [facility] [place] [facility] [address] [place], [place]  If you are in need of housing through the shelter, contact the [employer] at [phone].

## 2023-09-22 NOTE — ED Notes (Signed)
 Patient A&Ox4. Denies intent to harm self/others when asked. Denies A/VH. Patient denies any physical complaints when asked. No acute distress noted. Support and encouragement provided. Routine safety checks conducted according to facility protocol. Encouraged patient to notify staff if thoughts of harm toward self or others arise. Patient verbalize understanding and agreement. Will continue to monitor for safety.
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
## 2023-09-22 NOTE — ED Notes (Signed)
 Patient A&O x 4, ambulatory. Patient discharged in no acute distress. Patient denied SI/HI, A/VH upon discharge. Patient verbalized understanding of all discharge instructions reviewed on AVS via staff, to include follow up appointments, RX's and safety. Suicide safety plan completed and reviewed with Clinical research associate. A copy given to pt. Pt belongings returned to patient from locker intact. Patient escorted to lobby via staff for transport to destination. Safety maintained.

## 2023-09-28 ENCOUNTER — Ambulatory Visit (HOSPITAL_COMMUNITY)
Admission: EM | Admit: 2023-09-28 | Discharge: 2023-09-28 | Disposition: A | Discharge status: Home / Self Care | Attending: Psychiatry | Admitting: Psychiatry

## 2023-09-28 DIAGNOSIS — Z765 Malingerer [conscious simulation]: Secondary | ICD-10-CM | POA: Insufficient documentation

## 2023-09-28 DIAGNOSIS — Z5900 Homelessness unspecified: Secondary | ICD-10-CM | POA: Insufficient documentation

## 2023-09-28 DIAGNOSIS — F1910 Other psychoactive substance abuse, uncomplicated: Secondary | ICD-10-CM | POA: Insufficient documentation
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
## 2023-09-28 NOTE — Discharge Instructions (Signed)
 Follow-up with outpatient services.  Also follow up with established provider who is providing Suboxone  therapy.

## 2023-09-28 NOTE — ED Notes (Signed)
 Pt escorted off the property by security.

## 2023-09-28 NOTE — ED Provider Notes (Signed)
 Behavioral Health Urgent Care Medical Screening Exam  Patient Name: Chris [facility] MRN: [id number] Date of Evaluation: 09/28/23 Chief Complaint:  want somewhere to stay and also looking for detox from meth Diagnosis:  Final diagnoses:  Homelessness unspecified  Polysubstance abuse (HCC)  Malingering    History of Present illness: Chris [facility] is a 48 y.o. male. With a history of substance abuse, homelessness, meth and phentermine abuse.  Presented to [facility], accompanied by his girlfriend however patient and his girlfriend has the same last name leaving us  to believe they are both married.  Patient stated that he is looking for substance abuse treatment.  Review of patient records show patient was admitted a couple weeks ago and left AMA saying he had to go and business.  Patient does seem to be seeking secondary gain because patient story is inconsistent each time.  Patient currently not seeing a psychiatrist or therapist.  Per nursing staff the partner who is with the patient told nursing staff that patient was bothering her.  Is not sure what she meant by that if he was harassing her or she is scared of him.  Discussed with patient that he would be referred to outpatient services at this time.  Face-to-face observation of patient, patient is alert and oriented x 4, speech is clear, maintain eye contact.  Patient does not appear to be in any distress does not seem to be a risk to himself.  Patient denies SI, HI, AVH or paranoia.  Patient reports he use methamphetamines on a regular but the last time was a couple days ago he also report that he used cocaine but it has been a while.  Patient also stated that he is on Suboxone  treatment, that he is supposed to go see the doctor to get some Suboxone .  Patient story seems to be very inconsistent.  And it does appear that patient is seeking secondary gain because of homelessness. At the time of this assessment patient does not pose a risk to  himself.  Patient is advised to call 911 or return to the nearest ED should he experience suicidal thoughts homicidal thoughts or hallucination.  Recommend discharge for patient to follow up with outpatient resources provided to him.  Flowsheet Row ED from 09/28/2023 in [facility] ED from 09/19/2023 in [facility] ED from 09/18/2023 in [facility] Emergency Department at [facility]  C-SSRS RISK CATEGORY Error: Q3, 4, or 5 should not be populated when Q2 is No High Risk High Risk       Psychiatric Specialty Exam  Presentation  General Appearance:Casual  Eye Contact:Fair  Speech:Clear and Coherent  Speech Volume:Normal  Handedness:Right   Mood and Affect  Mood: Irritable  Affect: Congruent   Thought Process  Thought Processes: Coherent  Descriptions of Associations:Intact  Orientation:Full (Time, Place and Person)  Thought Content:WDL  Diagnosis of Schizophrenia or Schizoaffective disorder in past: No   Hallucinations:None  Ideas of Reference:None  Suicidal Thoughts:No  Homicidal Thoughts:No   Sensorium  Memory: Immediate Fair  Judgment: Poor  Insight: Fair   Art therapist  Concentration: Poor  Attention Span: Poor  Recall: [employer] of Knowledge: Fair  Language: Fair   Psychomotor Activity  Psychomotor Activity: Normal   Assets  Assets: Desire for Improvement; Housing   Sleep  Sleep: Fair  Number of hours:  6   Physical Exam: Physical Exam HENT:     Head: Normocephalic.     Nose:  Nose normal.  Eyes:     Pupils: Pupils are equal, round, and reactive to light.  Cardiovascular:     Rate and Rhythm: Normal rate.  Pulmonary:     Effort: Pulmonary effort is normal.  Musculoskeletal:        General: Normal range of motion.     Cervical back: Normal range of motion.  Neurological:     General: No focal deficit present.     Mental Status: He is  alert.  Psychiatric:        Mood and Affect: Mood normal.        Thought Content: Thought content normal.    Review of Systems  Constitutional: Negative.   HENT: Negative.    Eyes: Negative.   Respiratory: Negative.    Cardiovascular: Negative.   Gastrointestinal: Negative.   Genitourinary: Negative.   Musculoskeletal: Negative.   Skin: Negative.   Neurological: Negative.   Psychiatric/Behavioral:  Positive for substance abuse. The patient is nervous/anxious.    Blood pressure (!) 125/93, pulse 66, temperature 98 F (36.7 C), temperature source Oral, resp. rate 19, SpO2 100%. There is no height or weight on file to calculate BMI.  Musculoskeletal: Strength & Muscle Tone: within normal limits Gait & Station: normal Patient leans: N/A   [facility] MSE Discharge Disposition for Follow up and Recommendations: Based on my evaluation the patient does not appear to have an emergency medical condition and can be discharged with resources and follow up care in outpatient services for Substance Abuse Intensive Outpatient Program   Dorthea Gauze, NP 09/28/2023, 8:39 PM

## 2023-09-29 ENCOUNTER — Ambulatory Visit (HOSPITAL_COMMUNITY)
Admission: EM | Admit: 2023-09-29 | Discharge: 2023-09-29 | Disposition: A | Discharge status: Home / Self Care | Attending: Psychiatry | Admitting: Psychiatry

## 2023-09-29 DIAGNOSIS — F1910 Other psychoactive substance abuse, uncomplicated: Secondary | ICD-10-CM

## 2023-09-29 NOTE — Progress Notes (Signed)
   09/29/23 0940  [facility] Triage Screening (Walk-ins at [facility] only)  How Did You Hear About Us ? Self  What Is the Reason for Your Visit/Call Today? [facility] presents to [facility] voluntarily unaccompanied. Pt was just seen in this facility on yesterday. Pt states that he is dealing with addiction problems and his mental is off. Pt shares that he was just at [facility] and he left because he got frustrated and needed to handle somethings. Pt states that he had SI thoughts a couple days ago with the plan to cut himself. Pt states that he still feels like hurting himself at this time. Pt currently denies HI, AVH and alcohol/drug use. Pt states that he needs to be inpatient because he is tired and can't keep going like this.  How Long Has This Been Causing You Problems? > than 6 months  Have You Recently Had Any Thoughts About Hurting Yourself? Yes  How long ago did you have thoughts about hurting yourself? a couple days - plan to cut himself  Are You Planning to Commit Suicide/Harm Yourself At This time? Yes  Have you Recently Had Thoughts About Hurting Someone Marigene Shoulder? No  Are You Planning To Harm Someone At This Time? No  Physical Abuse Denies  Verbal Abuse Denies  Sexual Abuse Denies  Exploitation of patient/patient's resources Denies  Self-Neglect Denies  Are you currently experiencing any auditory, visual or other hallucinations? No  Have You Used Any Alcohol or Drugs in the Past 24 Hours? No  Do you have any current medical co-morbidities that require immediate attention? No  Clinician description of patient physical appearance/behavior: cooperative, calm, rambling  What Do You Feel Would Help You the Most Today? Treatment for Depression or other mood problem  If access to [facility] Urgent Care was not available, would you have sought care in the Emergency Department? No  Determination of Need Urgent (48 hours)  Options For Referral Medication Management;Outpatient Therapy;Inpatient Hospitalization   Determination of Need filed? Yes

## 2023-09-29 NOTE — ED Notes (Signed)
 Patient discharged home. Bus pass offered but pt declined. AVS given and reviewed. Pt voiced understanding.
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
## 2023-09-29 NOTE — Discharge Instructions (Addendum)
 Substance Abuse Resource SUBSTANCE USE TREATMENT for Medicaid and State Funded/IPRS  [facility] [address] [place], [place], [address] [phone] phone NOTE: [facility] is no longer offering IOP services.  Serves those who are low-income or have no insurance.  [facility] [address] [place], [place], [address] [phone] phone [phone] fax NOTE: Does have Substance Abuse-Intensive Outpatient Program [facility]) as well as transitional housing if eligible.  [facility] [address] [place], [place], [address] [phone] phone [phone] fax  [facility] [phone] [place], [place], [address] [phone] phone [phone] fax    HALFWAY HOUSES:  [facility] [phone]  [email or web address]     12 STEP PROGRAMS:  Alcoholics Anonymous of [place] [employer]  Narcotics Anonymous of [place] [employer]  Al-Anon of [place], [place] www.[place]alanon.org/find-meetings.html  Nar-Anon https://nar-anon.org/find-a-meetin      List of Residential placements:   [facility] in [place]: [phone]  [facility]: [phone]  [place]ah Neg[place], [place] [phone]: Male and male facility; 30-day program: (uninsured and Medicaid such as Cheryll Corti, [place], [place], partners)  [facility]: [phone]; men and women's facility; 28 days; Can have Medicaid tailored plan Tour manager or Partners)  [facility]: [phone] Milda Aline or Tanis Fan; 28 day program; must be fully detox; tailored Medicaid or no insurance  [address] in [place], [place]; [phone]; 28 day all males program; no insurance accepted  [facility] in [place]: Asa Lauth [phone] (no insurance or Medicaid only); 90 days; outpatient services but provide housing in apartments downtown  [place]  [facility] Admission: [phone]: Patient must complete phone screening for placement: [place], [place]; 6 month program; uninsured, Medicaid, and [employer].   [facility]: no insurance required; [phone]  [facility]: [phone]; Intake: Porfirio Bristol; Must fill out application online; Charlcie Conger Delay [phone] x [phone] [facility] in [place], [place]: [phone]; Admissions Coordinators Mr. Cornel Diesel or Carolynne Citron; 90 day program.  [facility]: [place], [place] [phone]; Co-Ed 9 month to a year program; Online application; Men entry fee is $500 (6-109months);  [facility]: [address] [place], [place] [address]; no fee or insurance required; minimum of 2 years; Highly structured; work based; Intake Coordinator is Larinda Plover [phone]  [facility] in [place], [place]: [phone]; Fax number is [phone]; website: [email or web address].[facility].org; Requires 3-6 page autobiography; 2 year program (18 months and then 41month transitional housing); Admission fee is $300; no insurance needed; work Automotive engineer in [place], [place]: [employer] Desk Staff: Annalee Barren [phone]: They have a Men's Regenerations Program 6-52months. Free program; There is an initial $300 fee however, they are willing to work with patients regarding that. Application is online.  [facility]: Admissions [phone] Almeta Arm ext [phone]; Any 7-90 day program is out of pocket; 12 month program is free of charge; there is a $275 entry fee; Patient is responsible for own transportation

## 2023-10-04 ENCOUNTER — Encounter (HOSPITAL_COMMUNITY): Payer: Self-pay

## 2023-10-04 ENCOUNTER — Other Ambulatory Visit: Payer: Self-pay

## 2023-10-04 ENCOUNTER — Emergency Department (HOSPITAL_COMMUNITY)

## 2023-10-04 ENCOUNTER — Inpatient Hospital Stay (HOSPITAL_COMMUNITY)
Admission: EM | Admit: 2023-10-04 | Discharge: 2023-10-07 | DRG: 728 | Discharge status: Against Medical Advice | Attending: Internal Medicine | Admitting: Internal Medicine

## 2023-10-04 DIAGNOSIS — J45909 Unspecified asthma, uncomplicated: Secondary | ICD-10-CM | POA: Diagnosis present

## 2023-10-04 DIAGNOSIS — L309 Dermatitis, unspecified: Secondary | ICD-10-CM | POA: Diagnosis present

## 2023-10-04 DIAGNOSIS — Z886 Allergy status to analgesic agent status: Secondary | ICD-10-CM

## 2023-10-04 DIAGNOSIS — F332 Major depressive disorder, recurrent severe without psychotic features: Secondary | ICD-10-CM | POA: Diagnosis present

## 2023-10-04 DIAGNOSIS — F1510 Other stimulant abuse, uncomplicated: Secondary | ICD-10-CM | POA: Diagnosis present

## 2023-10-04 DIAGNOSIS — N492 Inflammatory disorders of scrotum: Secondary | ICD-10-CM | POA: Diagnosis present

## 2023-10-04 DIAGNOSIS — G4700 Insomnia, unspecified: Secondary | ICD-10-CM | POA: Diagnosis present

## 2023-10-04 DIAGNOSIS — N12 Tubulo-interstitial nephritis, not specified as acute or chronic: Principal | ICD-10-CM | POA: Diagnosis present

## 2023-10-04 DIAGNOSIS — F1110 Opioid abuse, uncomplicated: Secondary | ICD-10-CM | POA: Diagnosis present

## 2023-10-04 DIAGNOSIS — F17210 Nicotine dependence, cigarettes, uncomplicated: Secondary | ICD-10-CM | POA: Diagnosis present

## 2023-10-04 DIAGNOSIS — F22 Delusional disorders: Secondary | ICD-10-CM | POA: Diagnosis present

## 2023-10-04 DIAGNOSIS — Z818 Family history of other mental and behavioral disorders: Secondary | ICD-10-CM

## 2023-10-04 DIAGNOSIS — Z9151 Personal history of suicidal behavior: Secondary | ICD-10-CM

## 2023-10-04 DIAGNOSIS — Z809 Family history of malignant neoplasm, unspecified: Secondary | ICD-10-CM

## 2023-10-04 DIAGNOSIS — Z5902 Unsheltered homelessness: Secondary | ICD-10-CM

## 2023-10-04 DIAGNOSIS — N390 Urinary tract infection, site not specified: Secondary | ICD-10-CM | POA: Diagnosis present

## 2023-10-04 DIAGNOSIS — R319 Hematuria, unspecified: Secondary | ICD-10-CM | POA: Diagnosis present

## 2023-10-04 DIAGNOSIS — F4322 Adjustment disorder with anxiety: Secondary | ICD-10-CM | POA: Diagnosis present

## 2023-10-04 DIAGNOSIS — F1994 Other psychoactive substance use, unspecified with psychoactive substance-induced mood disorder: Secondary | ICD-10-CM | POA: Diagnosis present

## 2023-10-04 DIAGNOSIS — N451 Epididymitis: Secondary | ICD-10-CM | POA: Diagnosis present

## 2023-10-04 DIAGNOSIS — R443 Hallucinations, unspecified: Secondary | ICD-10-CM

## 2023-10-04 DIAGNOSIS — F1410 Cocaine abuse, uncomplicated: Secondary | ICD-10-CM | POA: Diagnosis present

## 2023-10-04 DIAGNOSIS — B1920 Unspecified viral hepatitis C without hepatic coma: Secondary | ICD-10-CM | POA: Diagnosis present

## 2023-10-04 DIAGNOSIS — K219 Gastro-esophageal reflux disease without esophagitis: Secondary | ICD-10-CM | POA: Diagnosis present

## 2023-10-04 DIAGNOSIS — N10 Acute tubulo-interstitial nephritis: Secondary | ICD-10-CM | POA: Diagnosis not present

## 2023-10-04 DIAGNOSIS — Z79899 Other long term (current) drug therapy: Secondary | ICD-10-CM

## 2023-10-04 DIAGNOSIS — Z885 Allergy status to narcotic agent status: Secondary | ICD-10-CM

## 2023-10-04 DIAGNOSIS — Z8249 Family history of ischemic heart disease and other diseases of the circulatory system: Secondary | ICD-10-CM

## 2023-10-04 DIAGNOSIS — Z5329 Procedure and treatment not carried out because of patient's decision for other reasons: Secondary | ICD-10-CM | POA: Diagnosis present

## 2023-10-04 DIAGNOSIS — Z720 Tobacco use: Secondary | ICD-10-CM | POA: Diagnosis present

## 2023-10-04 DIAGNOSIS — N431 Infected hydrocele: Principal | ICD-10-CM | POA: Diagnosis present

## 2023-10-04 DIAGNOSIS — Z9152 Personal history of nonsuicidal self-harm: Secondary | ICD-10-CM

## 2023-10-04 DIAGNOSIS — F1514 Other stimulant abuse with stimulant-induced mood disorder: Secondary | ICD-10-CM | POA: Diagnosis present

## 2023-10-04 DIAGNOSIS — K2270 Barrett's esophagus without dysplasia: Secondary | ICD-10-CM | POA: Diagnosis present

## 2023-10-04 LAB — CBC WITH DIFFERENTIAL/PLATELET
Abs Immature Granulocytes: 0.12 10*3/uL — ABNORMAL HIGH (ref 0.00–0.07)
Basophils Absolute: 0.1 10*3/uL (ref 0.0–0.1)
Basophils Relative: 0 %
Eosinophils Absolute: 0 10*3/uL (ref 0.0–0.5)
Eosinophils Relative: 0 %
HCT: 41.8 % (ref 39.0–52.0)
Hemoglobin: 13.5 g/dL (ref 13.0–17.0)
Immature Granulocytes: 1 %
Lymphocytes Relative: 3 %
Lymphs Abs: 0.6 10*3/uL — ABNORMAL LOW (ref 0.7–4.0)
MCH: 27.6 pg (ref 26.0–34.0)
MCHC: 32.3 g/dL (ref 30.0–36.0)
MCV: 85.5 fL (ref 80.0–100.0)
Monocytes Absolute: 1.1 10*3/uL — ABNORMAL HIGH (ref 0.1–1.0)
Monocytes Relative: 6 %
Neutro Abs: 17.1 10*3/uL — ABNORMAL HIGH (ref 1.7–7.7)
Neutrophils Relative %: 90 %
Platelets: 289 10*3/uL (ref 150–400)
RBC: 4.89 MIL/uL (ref 4.22–5.81)
RDW: 13.2 % (ref 11.5–15.5)
WBC: 19 10*3/uL — ABNORMAL HIGH (ref 4.0–10.5)
nRBC: 0 % (ref 0.0–0.2)

## 2023-10-04 LAB — COMPREHENSIVE METABOLIC PANEL WITH GFR
ALT: 25 U/L (ref 0–44)
AST: 33 U/L (ref 15–41)
Albumin: 3.6 g/dL (ref 3.5–5.0)
Alkaline Phosphatase: 74 U/L (ref 38–126)
Anion gap: 13 (ref 5–15)
BUN: 14 mg/dL (ref 6–20)
CO2: 22 mmol/L (ref 22–32)
Calcium: 9 mg/dL (ref 8.9–10.3)
Chloride: 102 mmol/L (ref 98–111)
Creatinine, Ser: 0.82 mg/dL (ref 0.61–1.24)
GFR, Estimated: >60 mL/min (ref >60)
Glucose, Bld: 144 mg/dL — ABNORMAL HIGH (ref 70–99)
Potassium: 3.7 mmol/L (ref 3.5–5.1)
Sodium: 137 mmol/L (ref 135–145)
Total Bilirubin: 1.6 mg/dL — ABNORMAL HIGH (ref 0.0–1.2)
Total Protein: 6.2 g/dL — ABNORMAL LOW (ref 6.5–8.1)

## 2023-10-04 LAB — URINALYSIS, ROUTINE W REFLEX MICROSCOPIC
Bilirubin Urine: NEGATIVE
Glucose, UA: 50 mg/dL — AB
Ketones, ur: NEGATIVE mg/dL
Nitrite: POSITIVE — AB
Protein, ur: NEGATIVE mg/dL
Specific Gravity, Urine: 1.006 (ref 1.005–1.030)
pH: 7 (ref 5.0–8.0)

## 2023-10-04 LAB — LIPASE, BLOOD: Lipase: 22 U/L (ref 11–51)

## 2023-10-04 LAB — LACTIC ACID, PLASMA: Lactic Acid, Venous: 1 mmol/L (ref 0.5–1.9)

## 2023-10-04 MED ORDER — LACTATED RINGERS IV SOLN
INTRAVENOUS | Status: AC
Start: 2023-10-04 — End: 2023-10-05

## 2023-10-04 MED ORDER — PANTOPRAZOLE SODIUM 20 MG PO TBEC
20.0000 mg | DELAYED_RELEASE_TABLET | Freq: Every day | ORAL | Status: DC
  Administered 2023-10-05 – 2023-10-07 (×3): 20 mg via ORAL
  Filled 2023-10-04 (×3): qty 1

## 2023-10-04 MED ORDER — SODIUM CHLORIDE 0.9 % IV SOLN
1.0000 g | INTRAVENOUS | Status: DC
  Administered 2023-10-05 – 2023-10-06 (×2): 1 g via INTRAVENOUS
  Filled 2023-10-04 (×2): qty 10

## 2023-10-04 MED ORDER — ONDANSETRON HCL 4 MG PO TABS
4.0000 mg | ORAL_TABLET | Freq: Four times a day (QID) | ORAL | Status: DC | PRN
Start: 2023-10-04 — End: 2023-10-07

## 2023-10-04 MED ORDER — ONDANSETRON HCL 4 MG/2ML IJ SOLN
4.0000 mg | Freq: Four times a day (QID) | INTRAMUSCULAR | Status: DC | PRN
  Administered 2023-10-05: 4 mg via INTRAVENOUS
  Filled 2023-10-04: qty 2

## 2023-10-04 MED ORDER — HYDROMORPHONE HCL 1 MG/ML IJ SOLN
1.0000 mg | Freq: Once | INTRAMUSCULAR | Status: AC
  Administered 2023-10-04: 1 mg via INTRAVENOUS
  Filled 2023-10-04: qty 1

## 2023-10-04 MED ORDER — BISACODYL 5 MG PO TBEC: 5.0000 mg | DELAYED_RELEASE_TABLET | Freq: Every day | ORAL | Status: DC | PRN

## 2023-10-04 MED ORDER — SODIUM CHLORIDE 0.9 % IV SOLN
1.0000 g | Freq: Once | INTRAVENOUS | Status: AC
  Administered 2023-10-04: 1 g via INTRAVENOUS
  Filled 2023-10-04: qty 10

## 2023-10-04 MED ORDER — NICOTINE 21 MG/24HR TD PT24
21.0000 mg | MEDICATED_PATCH | Freq: Every day | TRANSDERMAL | Status: DC
  Administered 2023-10-04 – 2023-10-07 (×4): 21 mg via TRANSDERMAL
  Filled 2023-10-04 (×4): qty 1

## 2023-10-04 MED ORDER — BUPRENORPHINE HCL-NALOXONE HCL 2-0.5 MG SL SUBL
1.0000 | SUBLINGUAL_TABLET | Freq: Every day | SUBLINGUAL | Status: DC
  Administered 2023-10-04 – 2023-10-07 (×4): 1 via SUBLINGUAL
  Filled 2023-10-04 (×4): qty 1

## 2023-10-04 MED ORDER — SODIUM CHLORIDE 0.9 % IV BOLUS
1000.0000 mL | Freq: Once | INTRAVENOUS | Status: AC
  Administered 2023-10-04: 1000 mL via INTRAVENOUS

## 2023-10-04 MED ORDER — IOHEXOL 350 MG/ML SOLN
75.0000 mL | Freq: Once | INTRAVENOUS | Status: AC | PRN
  Administered 2023-10-04: 75 mL via INTRAVENOUS

## 2023-10-04 MED ORDER — ENOXAPARIN SODIUM 40 MG/0.4ML IJ SOSY
40.0000 mg | PREFILLED_SYRINGE | INTRAMUSCULAR | Status: DC
  Administered 2023-10-05: 40 mg via SUBCUTANEOUS
  Filled 2023-10-04 (×2): qty 0.4

## 2023-10-04 MED ORDER — SENNOSIDES-DOCUSATE SODIUM 8.6-50 MG PO TABS: 1.0000 | ORAL_TABLET | Freq: Every evening | ORAL | Status: DC | PRN

## 2023-10-04 MED ORDER — ACETAMINOPHEN 500 MG PO TABS
1000.0000 mg | ORAL_TABLET | Freq: Four times a day (QID) | ORAL | Status: DC | PRN
  Administered 2023-10-06: 1000 mg via ORAL
  Filled 2023-10-04 (×2): qty 2

## 2023-10-04 MED ORDER — ACETAMINOPHEN 650 MG RE SUPP: 650.0000 mg | Freq: Four times a day (QID) | RECTAL | Status: DC | PRN

## 2023-10-04 MED ORDER — MORPHINE SULFATE (PF) 4 MG/ML IV SOLN
4.0000 mg | Freq: Once | INTRAVENOUS | Status: AC
  Administered 2023-10-04: 4 mg via INTRAVENOUS
  Filled 2023-10-04: qty 1

## 2023-10-04 MED ORDER — QUETIAPINE FUMARATE 50 MG PO TABS
50.0000 mg | ORAL_TABLET | Freq: Every day | ORAL | Status: DC
  Administered 2023-10-04 – 2023-10-05 (×2): 50 mg via ORAL
  Filled 2023-10-04: qty 1
  Filled 2023-10-04: qty 2
  Filled 2023-10-04: qty 1

## 2023-10-04 MED ORDER — ONDANSETRON HCL 4 MG/2ML IJ SOLN
4.0000 mg | Freq: Once | INTRAMUSCULAR | Status: AC
  Administered 2023-10-04: 4 mg via INTRAVENOUS
  Filled 2023-10-04: qty 2

## 2023-10-04 NOTE — ED Notes (Signed)
 Introduced self to patient. Reconnected patient to the monitor and obtained vital signs. Patient refused second set of blood cultures. Patient reports testicular pain. Will notify provider

## 2023-10-04 NOTE — ED Provider Triage Note (Signed)
 Emergency Medicine Provider Triage Evaluation Note  Chris Randall , a 48 y.o. male  was evaluated in triage.  Pt complains of testicular pain that started earlier this morning.  Has had it on and off for the past month but not this bad.  Review of Systems  Positive:  Negative:   Physical Exam  BP 106/62   Pulse 93   Temp 97.8 F (36.6 C) (Oral)   Resp 16   Ht 5\' 4"  (1.626 m)   Wt 72.6 kg   SpO2 98%   BMI 27.47 kg/m  Gen:   Awake, appears uncomfortable Resp:  Normal effort  MSK:   Moves extremities without difficulty  Other:  Does not tolerate scrotal exam due to pain  Medical Decision Making  Medically screening exam initiated at 3:40 PM.  Appropriate orders placed.  [name] was informed that the remainder of the evaluation will be completed by another provider, this initial triage assessment does not replace that evaluation, and the importance of remaining in the ED until their evaluation is complete.  Urgent ultrasound   Felicie Horning, PA-C 10/04/23 1541

## 2023-10-04 NOTE — H&P (Addendum)
 History and Physical    Chris Randall:[id number] DOB: August 16, 1975 DOA: 10/04/2023  PCP: Patient, No Pcp Per  Patient coming from: Homeless  I have personally briefly reviewed patient's old medical records in [facility]  Chief Complaint: Testicular and left lower quadrant abdominal pain  HPI: Chris Randall is a 48 y.o. male with medical history significant for polysubstance use (EtOH, opioids, methamphetamine, cocaine, tobacco) with substance-induced mood disorder, GERD/Barrett's esophagus, homelessness who presented to the ED for evaluation of testicular and left lower quadrant abdominal pain.  Patient states that he has been having issues with pain involving the lower part of his testicles ongoing for few months, pain alternates from right to left testes.  More recently he has been having burning pain when urinating and now left lower quadrant abdominal pain.  He has not seen any purulent discharge from his urethra.  He reports experiencing chills but no subjective fever.  He does report a history of STI.  He is homeless and states that he has been living in a tent.  He has been seen in the behavioral health system 3 times in the last 15 days.  He reports he has been feeling paranoid and experiencing hallucinations.  He denies thoughts of hurting himself or anyone else.  He is requesting psychiatry consult for potential admission to [facility].  ED Course  Labs/Imaging on admission: I have personally reviewed following labs and imaging studies.  Initial vitals showed BP 106/62, pulse 93, RR 16, temp 97.8 F, SpO2 98% on room air.  Labs showed WBC 19.0, hemoglobin 13.5, platelets 289, sodium 137, potassium 3.7, bicarb 22, BUN 14, creatinine 0.82, serum glucose 144, lactic acid 1.0, lipase 22.  UA positive nitrites, moderate leukocytes, 0-5 RBCs, 11-20 WBCs, few bacteria.  Blood and urine cultures collected (patient refused second set of blood culture collection) and pending.   GC/chlamydia probe collected.  Scrotal ultrasound showed mild left and minimal right hydroceles.  Otherwise normal scrotal ultrasound.  CT abdomen/pelvis with contrast negative for acute localizing findings in the abdomen or pelvis.  Patient was given 1 L normal saline, IV ceftriaxone, IV morphine  and Dilaudid .  Of note patient was reportedly experiencing paranoia and hallucinations as well as calling 911 multiple times while he has been in the ED.  Hospitalist service was consulted to admit.  Review of Systems: All systems reviewed and are negative except as documented in history of present illness above.   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    Bronchitis    Esophageal varices (HCC) 2003 vs 2008   pt reprted varices on EGD in [place] "10 to 15 years ago. No varices or portal gastropathy on EGD 08/02/16   Hematemesis 08/02/2016   Hx GERD, findings on 08/02/16 EGD: esophagitis LA grade B, ?underlying Barrett's esphagus; 4 cm HH; hemorrhagic gastritis and red blood in stomach (source or bleeding); NO VARICES OR PORTALGASTROPATHY.    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  He states he quit using alcohol when he had issues for upper GI bleeding.  He has history of amphetamine and cocaine abuse but denies any recent use.  Allergies  Allergen Reactions   Motrin  [Ibuprofen ] Other (See Comments)    Hx of esophageal  ulcers    Vicodin [Hydrocodone -Acetaminophen ] Other (See Comments)    Irritates esophageal ulcers     Family History  Problem Relation Age of Onset   Heart failure Mother    Hypertension Other    Cancer Other      Prior to Admission medications   Medication Sig Start Date End Date Taking? Authorizing Provider  buprenorphine -naloxone  (SUBOXONE ) 2-0.5 mg SUBL SL tablet Place 1 tablet under the tongue daily. 09/23/23   Floyce Hutching, MD  hydrocortisone  cream 1 % Apply topically 2 (two) times daily. 09/22/23   Floyce Hutching, MD  QUEtiapine  (SEROQUEL ) 50 MG tablet Take 1 tablet (50 mg total) by mouth at bedtime. 09/22/23   Floyce Hutching, MD    Physical Exam: Vitals:   10/04/23 2000 10/04/23 2030 10/04/23 2122 10/04/23 2301  BP: 109/62 114/83    Pulse: 88 95 100   Resp: 16 (!) 21 (!) 24   Temp:    99.9 F (37.7 C)  TempSrc:    Oral  SpO2: 100% 100% 100%   Weight:      Height:       Constitutional: Sitting up in bed.  NAD, calm. Eyes: EOMI, lids and conjunctivae normal ENMT: Mucous membranes are moist. Posterior pharynx clear of any exudate or lesions.Normal dentition.  Neck: normal, supple, no masses. Respiratory: clear to auscultation bilaterally, no wheezing, no crackles. Normal respiratory effort. No accessory muscle use.  Cardiovascular: Regular rate and rhythm, no murmurs / rubs / gallops. No extremity edema. 2+ pedal pulses. Abdomen: Mild LLQ tenderness, no masses palpated. Musculoskeletal: no clubbing / cyanosis. No joint deformity upper and lower extremities. Good ROM, no contractures. Normal muscle tone.  Skin: no rashes, lesions, ulcers. No induration Neurologic: Sensation intact. Strength 5/5 in all 4.  Psychiatric: Alert and oriented x 3.  Reports paranoia and hallucinations.  Denies SI/HI.  EKG: Not performed.  Assessment/Plan Principal Problem:   UTI (urinary tract infection) Active Problems:   Tobacco abuse   Substance induced mood  disorder (HCC)   GERD (gastroesophageal reflux disease)   Chris Randall is a 48 y.o. male with medical history significant for polysubstance use (EtOH, opioids, methamphetamine, cocaine, tobacco) with substance-induced mood disorder, GERD/Barrett's esophagus, homelessness who is admitted with UTI and suspected left pyelonephritis.  Assessment and Plan: Urinary tract infection: Patient presenting with chronic testicular pain and more acute LLQ and left flank pain.  UA abnormal.  Scrotal ultrasound reassuring.  Given leukocytosis and symptomology suspicion is for left-sided pyelonephritis despite negative CT abdomen/pelvis. - Continue IV ceftriaxone - Follow urine and blood cultures - GC/chlamydia collected  Substance-induced mood disorder Psychosis/hallucinations/paranoia: Patient reports worsening paranoia and hallucinations.  He denies SI/HI.  He is requesting psychiatry consult. - Treat UTI as above - Resume Seroquel  50 mg nightly - Consult to inpatient psychiatry  Polysubstance use disorder: Patient reports smoking half pack per day.  Nicotine  patch provided.  He says he quit alcohol years ago when he was admitted for upper GI bleeding.  He has a history of cocaine and amphetamine abuse but denies recent use.  History of opioid use disorder previously on Suboxone .  Will restart on Suboxone  while  in hospital.  GERD/Barrett's esophagus: Continue PPI.  Homelessness: TOC consult.   DVT prophylaxis: enoxaparin (LOVENOX) injection 40 mg Start: 10/05/23 2200 Code Status: Full code, confirmed with patient admission Family Communication: Discussed with patient, he has discussed with family Disposition Plan: Homeless, dispo pending clinical progress Consults called: Consult to psychiatry placed Severity of Illness: The appropriate patient status for this patient is OBSERVATION. Observation status is judged to be reasonable and necessary in order to provide the required intensity of  service to ensure the patient's safety. The patient's presenting symptoms, physical exam findings, and initial radiographic and laboratory data in the context of their medical condition is felt to place them at decreased risk for further clinical deterioration. Furthermore, it is anticipated that the patient will be medically stable for discharge from the hospital within 2 midnights of admission.   Edith Gores MD [facility]  If 7PM-7AM, please contact night-coverage [email or web address]  10/04/2023, 11:20 PM

## 2023-10-04 NOTE — ED Provider Notes (Signed)
 [place] EMERGENCY DEPARTMENT AT [facility] Provider Note   CSN: [id number] Arrival date & time: 10/04/23  1457     History  Chief Complaint  Patient presents with   Testicle Pain    Chris Randall is a 48 y.o. male.  The history is provided by the patient and medical records. No language interpreter was used.  Testicle Pain This is a recurrent problem. The current episode started 12 to 24 hours ago. The problem occurs constantly. The problem has not changed since onset.Associated symptoms include abdominal pain. Pertinent negatives include no chest pain, no headaches and no shortness of breath. Nothing aggravates the symptoms. Nothing relieves the symptoms. He has tried nothing for the symptoms. The treatment provided no relief.  Abdominal Pain Pain location:  LLQ, L flank and suprapubic Pain quality: aching and dull   Pain radiates to:  L flank Pain severity:  Severe Onset quality:  Gradual Timing:  Constant Progression:  Waxing and waning Chronicity:  New Context: not trauma   Relieved by:  Nothing Worsened by:  Palpation and movement Associated symptoms: chills, dysuria, fatigue, nausea and vomiting   Associated symptoms: no chest pain, no constipation, no cough, no diarrhea, no fever and no shortness of breath        Home Medications Prior to Admission medications   Medication Sig Start Date End Date Taking? Authorizing Provider  buprenorphine -naloxone  (SUBOXONE ) 2-0.5 mg SUBL SL tablet Place 1 tablet under the tongue daily. 09/23/23   Floyce Hutching, MD  hydrocortisone  cream 1 % Apply topically 2 (two) times daily. 09/22/23   Floyce Hutching, MD  QUEtiapine  (SEROQUEL ) 50 MG tablet Take 1 tablet (50 mg total) by mouth at bedtime. 09/22/23   Floyce Hutching, MD      Allergies    Motrin  [ibuprofen ] and Vicodin [hydrocodone -acetaminophen ]    Review of Systems   Review of Systems  Constitutional:  Positive for chills and fatigue.  Negative for fever.  HENT:  Negative for congestion.   Respiratory:  Negative for cough, chest tightness and shortness of breath.   Cardiovascular:  Negative for chest pain.  Gastrointestinal:  Positive for abdominal pain, nausea and vomiting. Negative for abdominal distention, constipation and diarrhea.  Genitourinary:  Positive for dysuria, flank pain and testicular pain. Negative for frequency.  Musculoskeletal:  Positive for back pain. Negative for neck pain and neck stiffness.  Skin:  Negative for rash.  Neurological:  Negative for light-headedness, numbness and headaches.  Psychiatric/Behavioral:  Negative for agitation and confusion.   All other systems reviewed and are negative.   Physical Exam Updated Vital Signs BP 106/62   Pulse 93   Temp 97.8 F (36.6 C) (Oral)   Resp 16   Ht 5\' 4"  (1.626 m)   Wt 72.6 kg   SpO2 98%   BMI 27.47 kg/m  Physical Exam Vitals and nursing note reviewed. Exam conducted with a chaperone present.  Constitutional:      General: He is not in acute distress.    Appearance: He is well-developed. He is not ill-appearing, toxic-appearing or diaphoretic.  HENT:     Head: Normocephalic and atraumatic.     Nose: No congestion or rhinorrhea.     Mouth/Throat:     Mouth: Mucous membranes are dry.  Eyes:     Conjunctiva/sclera: Conjunctivae normal.  Cardiovascular:     Rate and Rhythm: Normal rate and regular rhythm.     Heart sounds: No murmur heard. Pulmonary:  Effort: Pulmonary effort is normal. No respiratory distress.     Breath sounds: Normal breath sounds. No wheezing, rhonchi or rales.  Chest:     Chest wall: No tenderness.  Abdominal:     General: Abdomen is flat.     Palpations: Abdomen is soft.     Tenderness: There is abdominal tenderness in the suprapubic area and left lower quadrant. There is left CVA tenderness.     Hernia: There is no hernia in the left inguinal area or right inguinal area.    Genitourinary:    Penis:  Normal.      Testes:        Right: Tenderness present.        Left: Tenderness present.     Comments: No erythema seen the patient has tenderness to the scrotum and testicles.  No rashes seen on the penis.  No penile tenderness or suprapubic tenderness initially. Musculoskeletal:        General: Tenderness present. No swelling.     Cervical back: Neck supple.  Skin:    General: Skin is warm and dry.     Capillary Refill: Capillary refill takes less than 2 seconds.     Findings: No erythema or rash.  Neurological:     Mental Status: He is alert.  Psychiatric:        Mood and Affect: Mood normal.     ED Results / Procedures / Treatments   Labs (all labs ordered are listed, but only abnormal results are displayed) Labs Reviewed  CBC WITH DIFFERENTIAL/PLATELET - Abnormal; Notable for the following components:      Result Value   WBC 19.0 (*)    Neutro Abs 17.1 (*)    Lymphs Abs 0.6 (*)    Monocytes Absolute 1.1 (*)    Abs Immature Granulocytes 0.12 (*)    All other components within normal limits  COMPREHENSIVE METABOLIC PANEL WITH GFR - Abnormal; Notable for the following components:   Glucose, Bld 144 (*)    Total Protein 6.2 (*)    Total Bilirubin 1.6 (*)    All other components within normal limits  URINALYSIS, ROUTINE W REFLEX MICROSCOPIC - Abnormal; Notable for the following components:   Glucose, UA 50 (*)    Hgb urine dipstick MODERATE (*)    Nitrite POSITIVE (*)    Leukocytes,Ua MODERATE (*)    Bacteria, UA FEW (*)    All other components within normal limits  CULTURE, BLOOD (ROUTINE X 2)  CULTURE, BLOOD (ROUTINE X 2)  URINE CULTURE  LACTIC ACID, PLASMA  LIPASE, BLOOD  HIV ANTIBODY (ROUTINE TESTING W REFLEX)  CBC  BASIC METABOLIC PANEL WITH GFR  RAPID URINE DRUG SCREEN, HOSP PERFORMED  GC/CHLAMYDIA PROBE AMP ([place]) NOT AT [facility]    EKG None  Radiology CT ABDOMEN PELVIS W CONTRAST Result Date: 10/04/2023 CLINICAL DATA:  Left lower quadrant  abdominal pain. EXAM: CT ABDOMEN AND PELVIS WITH CONTRAST TECHNIQUE: Multidetector CT imaging of the abdomen and pelvis was performed using the standard protocol following bolus administration of intravenous contrast. RADIATION DOSE REDUCTION: This exam was performed according to the departmental dose-optimization program which includes automated exposure control, adjustment of the mA and/or kV according to patient size and/or use of iterative reconstruction technique. CONTRAST:  75mL OMNIPAQUE  IOHEXOL  350 MG/ML SOLN COMPARISON:  04/28/2022 FINDINGS: Lower chest: No acute abnormality. Hepatobiliary: Stable 1.3 cm right hepatic cyst. Gallbladder is unremarkable. No biliary dilatation. Pancreas: Unremarkable. No pancreatic ductal dilatation or surrounding inflammatory changes.  Spleen: Normal in size without focal abnormality. Adrenals/Urinary Tract: Adrenal glands are unremarkable. Kidneys are normal, without renal calculi, suspicious focal lesion, or hydronephrosis. Bladder is unremarkable. Stomach/Bowel: Stomach is within normal limits. Appendix appears normal. No evidence of bowel wall thickening, distention, or inflammatory changes. Moderate volume of stool throughout the colon. Vascular/Lymphatic: Abdominal aorta is normal in caliber with aortoiliac atherosclerotic calcification again noted. No enlarged abdominal or pelvic lymph nodes. Reproductive: Prostate is unremarkable. Other: No abdominal wall hernia or abnormality. No abdominopelvic ascites. No free air. Musculoskeletal: No acute or significant osseous findings. IMPRESSION: 1. No acute localizing findings in the abdomen or pelvis. 2.  Aortic Atherosclerosis (ICD10-I70.0). Electronically Signed   By: Mannie Seek M.D.   On: 10/04/2023 18:16   US  SCROTUM W/DOPPLER Result Date: 10/04/2023 CLINICAL DATA:  Left scrotal pain for 2 months.  Worsening x1 day. EXAM: SCROTAL ULTRASOUND DOPPLER ULTRASOUND OF THE TESTICLES TECHNIQUE: Complete ultrasound  examination of the testicles, epididymis, and other scrotal structures was performed. Color and spectral Doppler ultrasound were also utilized to evaluate blood flow to the testicles. COMPARISON:  AP pelvis 05/04/2016 FINDINGS: Right testicle Measurements: 4.9 x 2.0 x 3.2 cm. No mass or microlithiasis visualized. Left testicle Measurements: 4.7 x 2.7 x 3.1 cm. No mass or microlithiasis visualized. Right epididymis:  Normal in size and appearance. Left epididymis:  Normal in size and appearance. Hydrocele:  Mild left and minimal right hydroceles. Varicocele:  None visualized. Pulsed Doppler interrogation of both testes demonstrates normal low resistance arterial and venous waveforms bilaterally. IMPRESSION: 1. Mild left and minimal right hydroceles. 2. Otherwise normal scrotal ultrasound. Electronically Signed   By: Bertina Broccoli M.D.   On: 10/04/2023 16:24    Procedures Procedures    CRITICAL CARE Performed by: Marine Sia Ifrah Vest Total critical care time: 25 minutes Critical care time was exclusive of separately billable procedures and treating other patients. Critical care was necessary to treat or prevent imminent or life-threatening deterioration. Critical care was time spent personally by me on the following activities: development of treatment plan with patient and/or surrogate as well as nursing, discussions with consultants, evaluation of patient's response to treatment, examination of patient, obtaining history from patient or surrogate, ordering and performing treatments and interventions, ordering and review of laboratory studies, ordering and review of radiographic studies, pulse oximetry and re-evaluation of patient's condition.  Medications Ordered in ED Medications  cefTRIAXone (ROCEPHIN) 1 g in sodium chloride  0.9 % 100 mL IVPB (has no administration in time range)  enoxaparin (LOVENOX) injection 40 mg (has no administration in time range)  lactated ringers infusion ( Intravenous  New Bag/Given 10/04/23 2326)  acetaminophen  (TYLENOL ) tablet 1,000 mg (has no administration in time range)    Or  acetaminophen  (TYLENOL ) suppository 650 mg (has no administration in time range)  ondansetron  (ZOFRAN ) tablet 4 mg (has no administration in time range)    Or  ondansetron  (ZOFRAN ) injection 4 mg (has no administration in time range)  senna-docusate (Senokot-S) tablet 1 tablet (has no administration in time range)  bisacodyl (DULCOLAX) EC tablet 5 mg (has no administration in time range)  buprenorphine -naloxone  (SUBOXONE ) 2-0.5 mg per SL tablet 1 tablet (1 tablet Sublingual Given 10/04/23 2321)  QUEtiapine  (SEROQUEL ) tablet 50 mg (50 mg Oral Given 10/04/23 2321)  nicotine  (NICODERM CQ  - dosed in mg/24 hours) patch 21 mg (21 mg Transdermal Patch Applied 10/04/23 2320)  pantoprazole  (PROTONIX ) EC tablet 20 mg (has no administration in time range)  morphine  (PF) 4 MG/ML injection 4 mg (  4 mg Intravenous Given 10/04/23 1727)  ondansetron  (ZOFRAN ) injection 4 mg (4 mg Intravenous Given 10/04/23 1726)  sodium chloride  0.9 % bolus 1,000 mL (0 mLs Intravenous Stopped 10/04/23 1900)  iohexol  (OMNIPAQUE ) 350 MG/ML injection 75 mL (75 mLs Intravenous Contrast Given 10/04/23 1801)  HYDROmorphone  (DILAUDID ) injection 1 mg (1 mg Intravenous Given 10/04/23 2005)  cefTRIAXone (ROCEPHIN) 1 g in sodium chloride  0.9 % 100 mL IVPB (0 g Intravenous Stopped 10/04/23 2157)  HYDROmorphone  (DILAUDID ) injection 1 mg (1 mg Intravenous Given 10/04/23 2209)    ED Course/ Medical Decision Making/ A&P                                 Medical Decision Making Amount and/or Complexity of Data Reviewed Labs: ordered. Radiology: ordered.  Risk Prescription drug management. Decision regarding hospitalization.    Chris Randall is a 48 y.o. male with a past medical history significant for hepatitis C and previous esophageal varices who presents with worsening left lower quadrant abdominal pain see pubic pain  dysuria and testicle pains.  According to patient, he has had pains on and off for the last month but this worsened today.  He reports he has had nausea and vomiting has had some chills.  He reports he feels warm.  He reports the pain is up to 10 out of 10 in severity in his left abdomen and suprapubic area and scrotum with testicles.  He reports burning when he pees.  He denies any history of urinary tract infections to his knowledge and does not report any recent STI infection but he is willing to get tested for gonorrhea and chlamydia.  He reports no trauma.  He reports no chest pain, shortness breath or cough.  Denies any neck pain or neck stiffness.  Denies any falls.  On exam, lungs clear.  Chest nontender.  Left CVA area is tender as his left flank and left lower abdomen.  Bowel sounds are appreciated.  Legs have no tenderness or swelling.  With a chaperone her scrotum was examined and he did not have palpable hernias but he had tenderness to the scrotum and testicles.  No other suprapubic tenderness initially just above the groin and he had no penile tenderness.  No rashes seen.  No crepitance appreciated.  Patient was seen in triage and had a testicular ultrasound ordered that did not show evidence of torsion or orchitis/epididymitis.  Given the patient's tenderness in the abdomen and flank and his description of chills, nausea, vomiting, and urinary changes I am somewhat concerned about a pyelonephritis.  Given the amount of discomfort he has and some screening blood work in triage revealing a white count of 19 with otherwise reassuring kidney and liver function labs, we will add on some other workup including lactic acid, lipase, blood culture, urine culture, and will get the urinalysis.  Will add on the GC/chlamydia given his report that he could have STI.  We will get a CT of the pelvis with contrast to look for intra-abdominal pathology and he has no history of kidney stones and denies history of  hematuria.  Will give pain medicine, nausea, and fluids.  Will recheck temperature as he does feel warm to the touch.  Anticipate reassessment after workup for further management.  9:29 PM Patient's workup continues to return.  He does have evidence of likely pyelonephritis with white count of 19, nitrates, leukocytes and bacteria.  Her CT  scan does not show acute abscess or surgical problems however with the pain in his abdomen going to the flanks with nausea and vomiting and chills I suspect he has pyelonephritis.  Patient also was concerned about paranoia and hallucinations and does have a history of schizophrenia.  He reportedly has been calling 911 multiple times while he has been here in the emergency department as he is concerned about his safety and paranoia.  He does say this has acutely worsened and I suspect it may be some degree of mild delirium versus psychiatric exacerbation in the setting of UTI.  Patient is amenable to IV antibiotics and admission for the pyelonephritis and then if his mental health problems do not improve after treatment of the UTI, he can get a psychiatry consult as an inpatient.  Will call medicine for admission.         Final Clinical Impression(s) / ED Diagnoses Final diagnoses:  Pyelonephritis  Paranoia (HCC)  Hallucinations    Clinical Impression: 1. Pyelonephritis   2. Paranoia (HCC)   3. Hallucinations     Disposition: Admit  This note was prepared with assistance of Dragon voice recognition software. Occasional wrong-word or sound-a-like substitutions may have occurred due to the inherent limitations of voice recognition software.     Meggin Ola, Marine Sia, MD 10/04/23 [phone]

## 2023-10-04 NOTE — Hospital Course (Signed)
 Chris Randall is a 48 y.o. male with medical history significant for polysubstance use (EtOH, opioids, methamphetamine, cocaine, tobacco) with substance-induced mood disorder, GERD/Barrett's esophagus, homelessness who is admitted with UTI and suspected left pyelonephritis.

## 2023-10-04 NOTE — ED Notes (Signed)
 Patient refused draw for second set of blood cultures.

## 2023-10-04 NOTE — ED Notes (Signed)
 Called CCMD to add to cardiac monitoring

## 2023-10-04 NOTE — ED Triage Notes (Signed)
 Pt reports L testicle pain x3 months (also reports that pain alternates from R to L testicle) and painful urination x1 month.  130/78 90HR

## 2023-10-04 NOTE — ED Notes (Signed)
 Patient transported to Ultrasound

## 2023-10-04 NOTE — ED Notes (Signed)
 Pt given ice chips

## 2023-10-05 ENCOUNTER — Inpatient Hospital Stay (HOSPITAL_COMMUNITY)

## 2023-10-05 DIAGNOSIS — F17210 Nicotine dependence, cigarettes, uncomplicated: Secondary | ICD-10-CM | POA: Diagnosis present

## 2023-10-05 DIAGNOSIS — R7881 Bacteremia: Secondary | ICD-10-CM | POA: Diagnosis not present

## 2023-10-05 DIAGNOSIS — N492 Inflammatory disorders of scrotum: Secondary | ICD-10-CM | POA: Diagnosis present

## 2023-10-05 DIAGNOSIS — B1920 Unspecified viral hepatitis C without hepatic coma: Secondary | ICD-10-CM | POA: Diagnosis present

## 2023-10-05 DIAGNOSIS — F332 Major depressive disorder, recurrent severe without psychotic features: Secondary | ICD-10-CM

## 2023-10-05 DIAGNOSIS — Z886 Allergy status to analgesic agent status: Secondary | ICD-10-CM | POA: Diagnosis not present

## 2023-10-05 DIAGNOSIS — J45909 Unspecified asthma, uncomplicated: Secondary | ICD-10-CM | POA: Diagnosis present

## 2023-10-05 DIAGNOSIS — G4700 Insomnia, unspecified: Secondary | ICD-10-CM | POA: Diagnosis present

## 2023-10-05 DIAGNOSIS — N12 Tubulo-interstitial nephritis, not specified as acute or chronic: Secondary | ICD-10-CM | POA: Diagnosis present

## 2023-10-05 DIAGNOSIS — R319 Hematuria, unspecified: Secondary | ICD-10-CM | POA: Diagnosis present

## 2023-10-05 DIAGNOSIS — F1994 Other psychoactive substance use, unspecified with psychoactive substance-induced mood disorder: Secondary | ICD-10-CM

## 2023-10-05 DIAGNOSIS — F1510 Other stimulant abuse, uncomplicated: Secondary | ICD-10-CM | POA: Diagnosis present

## 2023-10-05 DIAGNOSIS — F1410 Cocaine abuse, uncomplicated: Secondary | ICD-10-CM | POA: Diagnosis present

## 2023-10-05 DIAGNOSIS — F4322 Adjustment disorder with anxiety: Secondary | ICD-10-CM | POA: Diagnosis present

## 2023-10-05 DIAGNOSIS — Z8249 Family history of ischemic heart disease and other diseases of the circulatory system: Secondary | ICD-10-CM | POA: Diagnosis not present

## 2023-10-05 DIAGNOSIS — F1110 Opioid abuse, uncomplicated: Secondary | ICD-10-CM | POA: Diagnosis present

## 2023-10-05 DIAGNOSIS — K219 Gastro-esophageal reflux disease without esophagitis: Secondary | ICD-10-CM | POA: Diagnosis present

## 2023-10-05 DIAGNOSIS — Z885 Allergy status to narcotic agent status: Secondary | ICD-10-CM | POA: Diagnosis not present

## 2023-10-05 DIAGNOSIS — L309 Dermatitis, unspecified: Secondary | ICD-10-CM | POA: Diagnosis present

## 2023-10-05 DIAGNOSIS — F22 Delusional disorders: Secondary | ICD-10-CM | POA: Diagnosis present

## 2023-10-05 DIAGNOSIS — N431 Infected hydrocele: Secondary | ICD-10-CM | POA: Diagnosis present

## 2023-10-05 DIAGNOSIS — Z79899 Other long term (current) drug therapy: Secondary | ICD-10-CM | POA: Diagnosis not present

## 2023-10-05 DIAGNOSIS — R443 Hallucinations, unspecified: Secondary | ICD-10-CM | POA: Diagnosis present

## 2023-10-05 DIAGNOSIS — F1514 Other stimulant abuse with stimulant-induced mood disorder: Secondary | ICD-10-CM | POA: Diagnosis present

## 2023-10-05 DIAGNOSIS — Z809 Family history of malignant neoplasm, unspecified: Secondary | ICD-10-CM | POA: Diagnosis not present

## 2023-10-05 DIAGNOSIS — N451 Epididymitis: Secondary | ICD-10-CM | POA: Diagnosis present

## 2023-10-05 DIAGNOSIS — F1120 Opioid dependence, uncomplicated: Secondary | ICD-10-CM | POA: Diagnosis not present

## 2023-10-05 DIAGNOSIS — Z5329 Procedure and treatment not carried out because of patient's decision for other reasons: Secondary | ICD-10-CM | POA: Diagnosis present

## 2023-10-05 DIAGNOSIS — Z5902 Unsheltered homelessness: Secondary | ICD-10-CM | POA: Diagnosis not present

## 2023-10-05 LAB — BASIC METABOLIC PANEL WITH GFR
Anion gap: 7 (ref 5–15)
BUN: 13 mg/dL (ref 6–20)
CO2: 25 mmol/L (ref 22–32)
Calcium: 8.4 mg/dL — ABNORMAL LOW (ref 8.9–10.3)
Chloride: 101 mmol/L (ref 98–111)
Creatinine, Ser: 0.93 mg/dL (ref 0.61–1.24)
GFR, Estimated: >60 mL/min (ref >60)
Glucose, Bld: 87 mg/dL (ref 70–99)
Potassium: 3.6 mmol/L (ref 3.5–5.1)
Sodium: 133 mmol/L — ABNORMAL LOW (ref 135–145)

## 2023-10-05 LAB — RAPID URINE DRUG SCREEN, HOSP PERFORMED
Amphetamines: POSITIVE — AB
Barbiturates: NOT DETECTED
Benzodiazepines: NOT DETECTED
Cocaine: POSITIVE — AB
Opiates: POSITIVE — AB
Tetrahydrocannabinol: NOT DETECTED

## 2023-10-05 LAB — GC/CHLAMYDIA PROBE AMP (~~LOC~~) NOT AT ARMC
Comment: NEGATIVE
Comment: NORMAL

## 2023-10-05 LAB — CBC
HCT: 37.7 % — ABNORMAL LOW (ref 39.0–52.0)
Hemoglobin: 12.7 g/dL — ABNORMAL LOW (ref 13.0–17.0)
MCH: 28 pg (ref 26.0–34.0)
MCHC: 33.7 g/dL (ref 30.0–36.0)
MCV: 83 fL (ref 80.0–100.0)
Platelets: 293 10*3/uL (ref 150–400)
RBC: 4.54 MIL/uL (ref 4.22–5.81)
RDW: 13.2 % (ref 11.5–15.5)
WBC: 19.8 10*3/uL — ABNORMAL HIGH (ref 4.0–10.5)
nRBC: 0 % (ref 0.0–0.2)

## 2023-10-05 LAB — HIV ANTIBODY (ROUTINE TESTING W REFLEX): HIV Screen 4th Generation wRfx: NONREACTIVE

## 2023-10-05 MED ORDER — LORAZEPAM 1 MG PO TABS
1.0000 mg | ORAL_TABLET | ORAL | Status: AC | PRN
  Administered 2023-10-05 (×2): 1 mg via ORAL
  Filled 2023-10-05 (×2): qty 1

## 2023-10-05 MED ORDER — IOHEXOL 350 MG/ML SOLN
75.0000 mL | Freq: Once | INTRAVENOUS | Status: AC | PRN
  Administered 2023-10-05: 75 mL via INTRAVENOUS

## 2023-10-05 MED ORDER — HYDROMORPHONE HCL 1 MG/ML IJ SOLN
1.0000 mg | INTRAMUSCULAR | Status: AC | PRN
  Administered 2023-10-05 (×3): 1 mg via INTRAVENOUS
  Filled 2023-10-05 (×3): qty 1

## 2023-10-05 NOTE — Progress Notes (Signed)
  Progress Note   Patient: Chris Randall ZOX:[id number] DOB: 10-Apr-1976 DOA: 10/04/2023     0 DOS: the patient was seen and examined on 10/05/2023   Brief hospital course: HASHEM FILYAW is a 48 y.o. male with medical history significant for polysubstance use (EtOH, opioids, methamphetamine, cocaine, tobacco) with substance-induced mood disorder, GERD/Barrett's esophagus, homelessness who is admitted with UTI and suspected left pyelonephritis.  Assessment and Plan: Possible Urinary tract infection with scrotal cellulitis, r/o nec fas Patient presenting with chronic testicular pain and more acute LLQ and left flank pain.  UA abnormal.  Scrotal ultrasound reassuring.   Initial CT read as unremarkable, however images did not capture scrotum. Personally reviewed On exam, scrotum is swollen, erythematous and tender to touch -Will order STAT pelvic, dedicated for scrotum -continued on rocephin   Substance-induced mood disorder Psychosis/hallucinations/paranoia: Patient reports worsening paranoia and hallucinations.  He denies SI/HI.   -Psychiatry consulted  -cont abx per above - continue Seroquel  50 mg nightly   Polysubstance use disorder: Patient reports smoking half pack per day.  Nicotine  patch provided.  He says he quit alcohol years ago when he was admitted for upper GI bleeding.  He has a history of cocaine and amphetamine abuse but denies recent use.  History of opioid use disorder previously on Suboxone .  restarted on Suboxone  while in hospital.   GERD/Barrett's esophagus: Continue PPI.   Homelessness: TOC consult.      Subjective: Complaining of scrotal pain and swelling  Physical Exam: Vitals:   10/05/23 0130 10/05/23 0602 10/05/23 0817 10/05/23 0842  BP: 128/76 121/75 138/76 (!) 152/89  Pulse: 99 (!) 117 (!) 112 92  Resp: 18 18 18 16   Temp: 98.1 F (36.7 C) 98 F (36.7 C) 99.9 F (37.7 C) 98.9 F (37.2 C)  TempSrc: Oral  Oral Oral  SpO2: 99% 100% 97% 98%   Weight:      Height:       General exam: Awake, laying in bed, in nad Respiratory system: Normal respiratory effort, no wheezing Cardiovascular system: regular rate, s1, s2 Gastrointestinal system: Soft, nondistended, positive BS Central nervous system: CN2-12 grossly intact, strength intact Extremities: Perfused, no clubbing Skin: Normal skin turgor, scrotum swollen, tender, erythematous Psychiatry: Mood normal // no visual hallucinations   Data Reviewed:  Labs reviewed: Na 133, K 3.6, Cr 0.93, WBC 19.8, Hgb 12.7, Plts 293  Family Communication: Pt in room, family not at bedside  Disposition: Status is: Observation The patient will require care spanning > 2 midnights and should be moved to inpatient because: severity of illness  Planned Discharge Destination: Home    Author: Cherylle Corwin, MD 10/05/2023 5:25 PM  For on call review [email or web address].

## 2023-10-05 NOTE — Progress Notes (Signed)
 Pt still complaining of 10/10 testicular pain. He's testes feels warm to touch, erythematous, and swollen. IV diladid given. MD aware. Will continue to monitor.

## 2023-10-05 NOTE — ED Notes (Signed)
 Patient intermittently clamping IV and pausing continuous LR infusion. Patient reminded to call RN if he needs assistance. IV unclamped and LR infusion resumed.

## 2023-10-05 NOTE — ED Notes (Signed)
 Patient reports he is hearing voices and having paranoid thoughts. Patient requesting psychiatric evaluation. Patient admits he has been calling 911 from room phone because he is "scared of his ex". RN observed writing on the wall by patient that stated "Chris Randall is trying to kill me". Provider notified.

## 2023-10-05 NOTE — Consult Note (Signed)
 [facility] Psychiatric Consult Initial  Patient Name: .Chris Randall  MRN: [id number]  DOB: [birth year]  Consult Order details:  Orders (From admission, onward)     Start     Ordered   10/04/23 2301  IP CONSULT TO PSYCHIATRY       Ordering Provider: Kenny Peals, MD  Provider:  (Not yet assigned)  Question Answer Comment  Location [facility]   Reason for Consult? Hx of substance induced mood disorder experiencing paranoia and hallucinations.  Admitted for UTI.      10/04/23 2301             Mode of Visit: In person    Psychiatry Consult Evaluation  Service Date: Oct 05, 2023 LOS:  LOS: 0 days  Chief Complaint   Primary Psychiatric Diagnoses  MDD 2.  Polysubstance abuse    Assessment  Chris Randall is a 48 y.o. male admitted: Medicallyfor 10/04/2023  2:57 PM for testicular pain. He carries the psychiatric diagnoses of  and has a past medical history of tobacco use, GERD, UTI, asthma, erosive esophageal varices, hep c.   Patient's current presentation of poor sleep, decreased energy, decreased appetite and occasional low mood is most consistent with a diagnosis of adjustment disorder (all symptoms seem to be relatively new and tied to worsening in symptoms of grief).  Some of his impulsivity (makes a couple of clearly inappropriate jokes during interview ).  His current presentation of persistent and ongoing meth use despite medical and psychosocial consequences (numerous hospitalizations, separation from gf,  familial disruption), cravings to use, desire to cut down use with previous unsuccessful attempts, significant amounts of time spent using meth, impaired control over use, cravings, and evidence of tolerance are consistent with amphetamine use disorder, severe. Additionally, while meth use is likely contributing to symptoms of depression, it is felt that patient also meets criteria for comorbid major depressive disorder given severity and  persistence of symptoms.  Patient also endorses history of trauma with associated recurrent intrusive memories to past traumatic events and flashbacks, nightmares, hypervigilance, and avoidance behaviors consistent with posttraumatic stress disorder.  Although patient carries historical diagnosis of bipolar disorder, there is low suspicion for bipolar spectrum illness at this time as patient denies history of mania or hypomania outside of active periods of substance use.  Patient endorses inconsistent adherence with medications impacted by substance use.  Will attempt to consolidate medications as below and prioritize medications for depression and PTSD.  He expresses strong desire for inpatient rehab and shows insight into importance of going directly from hospital to rehab; agree with this goal if possible to minimize risk of return to use.  Although patient has history of suicidal ideation, he denies currently and it is not felt that she meets criteria for involuntary commitment at this time however will continue to assess safety and most appropriate disposition.   Today, patient reports reasonable level of anxiety related to next steps once he leaves the hospital but remains committed in his desire to go to inpatient rehab for meth use disorder after medical hospitalization. No acute safety concerns on interview. He is not presenting with any acute mania at this time.  Otherwise, no changes to plan of care at this time.    Diagnoses:  Active Hospital problems: Principal Problem:   UTI (urinary tract infection) Active Problems:   Tobacco abuse   Substance induced mood disorder (HCC)   GERD (gastroesophageal reflux disease)    Plan   ##  Psychiatric Medication Recommendations:  -Continue with current recommendations per primary team. - May resume Suboxone  for outpatient management once verified.  Patient has recent prescription, however endorses abuse of Suboxone . - Consider starting cows   ##  Medical Decision Making Capacity: Not specifically addressed in this encounter  ## Further Work-up:  -- None TSH, B12, folate, EKG, While pt on Qtc prolonging medications, please monitor & replete K+ to 4 and Mg2+ to 2, TOC consult for substance abuse resources, U/A, or UDS -- most recent EKG on  09/19/23 had QtC of 417 -- Pertinent labwork reviewed earlier this admission includes: positive urinalysis for UTI (moderate leuks and nitrites, UDS positive for opiates, amphetamines, and cocaine.  CBC abnormal as a result of white blood cell count (19.8).    ## Disposition:-- There are no psychiatric contraindications to discharge at this time preferred door-to-door transfer however if patient is medically cleared prior to bed offer an inpatient rehab; consider transfer to facility based crisis center at [facility] behavioral health.   ## Behavioral / Environmental: - No specific recommendations at this time.     ## Safety and Observation Level:  - Based on my clinical evaluation, I estimate the patient to be at low risk of self harm in the current setting. - At this time, we recommend  routine. This decision is based on my review of the chart including patient's history and current presentation, interview of the patient, mental status examination, and consideration of suicide risk including evaluating suicidal ideation, plan, intent, suicidal or self-harm behaviors, risk factors, and protective factors. This judgment is based on our ability to directly address suicide risk, implement suicide prevention strategies, and develop a safety plan while the patient is in the clinical setting. Please contact our team if there is a concern that risk level has changed.  CSSR Risk Category:C-SSRS RISK CATEGORY: No Risk   Patients current symptoms do not reflect the need for ongoing inpatient psychiatric services or inpatient psychiatric hospitalization. While future psychiatric events cannot be accurately  predicted, the patient does not currently require acute inpatient psychiatric care and does not currently meet [facility]  involuntary commitment criteria.  Suicide Risk Assessment: Patient has following modifiable risk factors for suicide: untreated depression and medication noncompliance, which we are addressing by inpatient rehab, assistance with outpatient resources and referrals. Patient has following non-modifiable or demographic risk factors for suicide: male gender, separation or divorce, history of suicide attempt, history of self harm behavior, and psychiatric hospitalization Patient has the following protective factors against suicide: Access to outpatient mental health care, Supportive friends, Cultural, spiritual, or religious beliefs that discourage suicide, and Frustration tolerance  Thank you for this consult request. Recommendations have been communicated to the primary team.  We will sign off at this time.   Rella Cardinal, FNP       History of Present Illness  Relevant Aspects of [facility] Course:  Admitted on 10/04/2023 for testicular pain and urinary tract infection.   Patient Report:  Patient is alert and oriented, cooperative.  He is noted to be very tearful upon entering the room, reports ports recently discovering his childhood friend passed away from a heart attack.  He tells me he continues to struggle with substances and recently was at [facility] for detox, however relapsed immediately upon discharge.  He reports he has been using crystal meth a little over 1 year, amount is unknown he describes it as "a little line or pinched of" in terms  of Suboxone  he reports using 8 mg pad " broken into 1 /4 over about 10 years."  He denies currently feeling suicidal, reports endorsing suicidal ideations x 2 weeks ago.  He describes his mood as sad and hopeless.  He reports current stresses include recent separation "moving on" from girlfriend,  friends dying, unemployed.  He reports his last episode of self-harm was this past winter in 2025.  He reports cutting made his heart ache pain go away.  When assessing for past psychiatric history he endorses numerous inpatient psychiatric hospitalizations.  Currently not receiving any outpatient services.  Patient reports he is currently taking Seroquel  50 mg p.o. nightly, however would like to have this reduced to 25.  Patient also inquires about starting Adderall, told patient to discuss with inpatient rehab feel restarting prescribed medication it was previously abuse is not a good habit.  Psych ROS:  Depression: Sadness, hopelessness, tearfulness, insomnia Anxiety: Nervousness, excessive worrying, heart palpitations Mania (lifetime and current): Endorses however he is usually under the influence of the substance Psychosis: (lifetime and current): Denies currently reports history of hallucinations when under the influence of a substance  Collateral information:  N/a  Review of Systems  Psychiatric/Behavioral:  Positive for depression, hallucinations and suicidal ideas. The patient is nervous/anxious.   All other systems reviewed and are negative.    Psychiatric and Social History  Psychiatric History:  Information collected from Patient and chart review.   Prev Dx/Sx: Adhd, polysusbtance abuse, MDD Current Psych Provider: Denies Home Meds (current): Suboxone  Previous Med Trials: Suboxone  Therapy: [facility]  Prior Psych Hospitalization: Numerous too many to count  Prior Self Harm: Yes cutting Prior Violence: Yes  Family Psych History: Per patient " extensive mental illness" Family Hx suicide: MGGM completed by hanging per patient  Social History:  Developmental Hx: WNL Educational Hx: 8th grade Occupational Hx: Unemployed Legal Hx: Denies Living Situation: Homeless Spiritual Hx: Christian Access to weapons/lethal means: Denies   Substance History Alcohol: Denies  Illicit  drugs: Crystal meth.  Prescription drug abuse: Suboxone  Rehab hx: Denies  Exam Findings  Physical Exam: Sitting on bed very tearful reports finding out a friend passed away by heart attack.  Vital Signs:  Temp:  [98 F (36.7 C)-99.9 F (37.7 C)] 98.9 F (37.2 C) (05/14 0842) Pulse Rate:  [88-117] 92 (05/14 0842) Resp:  [16-29] 16 (05/14 0842) BP: (109-152)/(62-89) 152/89 (05/14 0842) SpO2:  [97 %-100 %] 98 % (05/14 0842) Blood pressure (!) 152/89, pulse 92, temperature 98.9 F (37.2 C), temperature source Oral, resp. rate 16, height 5\' 4"  (1.626 m), weight 72.6 kg, SpO2 98%. Body mass index is 27.47 kg/m.  Physical Exam Vitals and nursing note reviewed.  Constitutional:      Appearance: Normal appearance. He is normal weight.  Neurological:     General: No focal deficit present.     Mental Status: He is alert and oriented to person, place, and time. Mental status is at baseline.  Psychiatric:        Attention and Perception: Attention and perception normal.        Mood and Affect: Mood is anxious and depressed. Affect is tearful.        Speech: Speech is tangential.        Behavior: Behavior is hyperactive. Behavior is cooperative.        Thought Content: Thought content normal.        Cognition and Memory: Cognition and memory normal.        Judgment:  Judgment normal.     Mental Status Exam: General Appearance: Fairly Groomed  Orientation:  Full (Time, Place, and Person)  Memory:  Immediate;   Fair Recent;   Good  Concentration:  Concentration: Good and Attention Span: Good  Recall:  Fair  Attention  Fair  Eye Contact:  Fair  Speech:  Clear and Coherent and Normal Rate  Language:  Good  Volume:  Normal  Mood: I'm sad  Affect:  Appropriate, Restricted, and Tearful  Thought Process:  Coherent, Linear, and Descriptions of Associations: Circumstantial  Thought Content:  Logical and Tangential  Suicidal Thoughts:  No  Homicidal Thoughts:  No  Judgement:  Intact   Insight:  Present  Psychomotor Activity:  Normal  Akathisia:  No  Fund of Knowledge:  Good      Assets:  Communication Skills Desire for Improvement Financial Resources/Insurance Leisure Time Physical Health Resilience Social Support  Cognition:  WNL  ADL's:  Intact  AIMS (if indicated):        Other History   These have been pulled in through the EMR, reviewed, and updated if appropriate.  Family History:  The patient's family history includes Cancer in an other family member; Heart failure in his mother; Hypertension in an other family member.  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    Bronchitis    Esophageal varices (HCC) 2003 vs 2008   pt reprted varices on EGD in [place] "10 to 15 years ago. No varices or portal gastropathy on EGD 08/02/16   Hematemesis 08/02/2016   Hx GERD, findings on 08/02/16 EGD: esophagitis LA grade B, ?underlying Barrett's esphagus; 4 cm HH; hemorrhagic gastritis and red blood in stomach (source or bleeding); NO VARICES OR PORTALGASTROPATHY.    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   Opioid, cocaine, alcohol    Surgical History: Past Surgical History:  Procedure Laterality Date   ESOPHAGOGASTRODUODENOSCOPY (EGD) WITH PROPOFOL  N/A 08/02/2016   Procedure: ESOPHAGOGASTRODUODENOSCOPY (EGD) WITH PROPOFOL ;  Surgeon: Kenney Peacemaker, MD;  Location: [facility] ENDOSCOPY;  Service: Endoscopy;  Laterality: N/A;   ROTATOR CUFF REPAIR  1995   SHOULDER SURGERY       Medications:   Current Facility-Administered Medications:    acetaminophen  (TYLENOL ) tablet 1,000 mg, 1,000 mg, Oral, Q6H PRN **OR** acetaminophen  (TYLENOL ) suppository 650 mg, 650 mg, Rectal, Q6H PRN, Lydia Sams, Vishal R, MD   bisacodyl (DULCOLAX) EC tablet 5 mg, 5 mg, Oral, Daily PRN, Patel, Vishal R, MD    buprenorphine -naloxone  (SUBOXONE ) 2-0.5 mg per SL tablet 1 tablet, 1 tablet, Sublingual, Daily, Edith Gores R, MD, 1 tablet at 10/05/23 0843   cefTRIAXone (ROCEPHIN) 1 g in sodium chloride  0.9 % 100 mL IVPB, 1 g, Intravenous, Q24H, Patel, Vishal R, MD   enoxaparin (LOVENOX) injection 40 mg, 40 mg, Subcutaneous, Q24H, Patel, Vishal R, MD   LORazepam  (ATIVAN ) tablet 1 mg, 1 mg, Oral, Q4H PRN, Patel, Vishal R, MD, 1 mg at 10/05/23 1246   nicotine  (NICODERM CQ  - dosed in mg/24 hours) patch 21 mg, 21 mg, Transdermal, Daily, Patel, Vishal R, MD, 21 mg at 10/05/23 1029   ondansetron  (ZOFRAN ) tablet 4 mg, 4 mg, Oral, Q6H PRN **OR** ondansetron  (ZOFRAN ) injection 4 mg, 4 mg, Intravenous, Q6H PRN, Patel, Vishal R, MD, 4 mg at 10/05/23 0100   pantoprazole  (PROTONIX ) EC tablet 20 mg,  20 mg, Oral, Daily, Patel, Vishal R, MD, 20 mg at 10/05/23 0843   QUEtiapine  (SEROQUEL ) tablet 50 mg, 50 mg, Oral, QHS, Patel, Vishal R, MD, 50 mg at 10/04/23 2321   senna-docusate (Senokot-S) tablet 1 tablet, 1 tablet, Oral, QHS PRN, Kenny Peals, MD  Allergies: Allergies  Allergen Reactions   Motrin  [Ibuprofen ] Other (See Comments)    Hx of esophageal ulcers    Vicodin [Hydrocodone -Acetaminophen ] Other (See Comments)    Irritates esophageal ulcers     Rella Cardinal, FNP

## 2023-10-05 NOTE — Progress Notes (Signed)
 Pt called 911 stating "someone is trying to kill me." MD notified. Tele sitter ordered for safety. Psych consulted. Will continue to monitor.

## 2023-10-05 NOTE — Progress Notes (Signed)
   10/05/23 0602  Assess: MEWS Score  Temp 98 F (36.7 C)  BP 121/75  MAP (mmHg) 85  Pulse Rate (!) 117  Resp 18  SpO2 100 %  O2 Device Room Air  Assess: MEWS Score  MEWS Temp 0  MEWS Systolic 0  MEWS Pulse 2  MEWS RR 0  MEWS LOC 0  MEWS Score 2  MEWS Score Color Yellow  Assess: if the MEWS score is Yellow or Red  Were vital signs accurate and taken at a resting state? Yes  Does the patient meet 2 or more of the SIRS criteria? No  MEWS guidelines implemented  Yes, yellow  Treat  MEWS Interventions Considered administering scheduled or prn medications/treatments as ordered  Take Vital Signs  Increase Vital Sign Frequency  Yellow: Q2hr x1, continue Q4hrs until patient remains green for 12hrs  Escalate  MEWS: Escalate Yellow: Discuss with charge nurse and consider notifying provider and/or RRT  Notify: Charge Nurse/RN  Name of Charge Nurse/RN Notified [facility]  Provider Notification  Provider Name/Title N/A  Assess: SIRS CRITERIA  SIRS Temperature  0  SIRS Respirations  0  SIRS Pulse 1  SIRS WBC 0  SIRS Score Sum  1

## 2023-10-06 ENCOUNTER — Inpatient Hospital Stay (HOSPITAL_COMMUNITY)

## 2023-10-06 DIAGNOSIS — R443 Hallucinations, unspecified: Secondary | ICD-10-CM

## 2023-10-06 DIAGNOSIS — R7881 Bacteremia: Secondary | ICD-10-CM

## 2023-10-06 DIAGNOSIS — N12 Tubulo-interstitial nephritis, not specified as acute or chronic: Secondary | ICD-10-CM | POA: Diagnosis not present

## 2023-10-06 LAB — COMPREHENSIVE METABOLIC PANEL WITH GFR
ALT: 14 U/L (ref 0–44)
AST: 15 U/L (ref 15–41)
Albumin: 2.8 g/dL — ABNORMAL LOW (ref 3.5–5.0)
Alkaline Phosphatase: 55 U/L (ref 38–126)
Anion gap: 10 (ref 5–15)
BUN: 9 mg/dL (ref 6–20)
CO2: 22 mmol/L (ref 22–32)
Calcium: 8 mg/dL — ABNORMAL LOW (ref 8.9–10.3)
Chloride: 101 mmol/L (ref 98–111)
Creatinine, Ser: 0.83 mg/dL (ref 0.61–1.24)
GFR, Estimated: >60 mL/min (ref >60)
Glucose, Bld: 181 mg/dL — ABNORMAL HIGH (ref 70–99)
Potassium: 3.9 mmol/L (ref 3.5–5.1)
Sodium: 133 mmol/L — ABNORMAL LOW (ref 135–145)
Total Bilirubin: 0.5 mg/dL (ref 0.0–1.2)
Total Protein: 5.9 g/dL — ABNORMAL LOW (ref 6.5–8.1)

## 2023-10-06 LAB — GC/CHLAMYDIA PROBE AMP (~~LOC~~) NOT AT ARMC
Chlamydia: NEGATIVE
Comment: NEGATIVE
Comment: NORMAL
Neisseria Gonorrhea: NEGATIVE

## 2023-10-06 LAB — CBC
HCT: 36.4 % — ABNORMAL LOW (ref 39.0–52.0)
Hemoglobin: 11.8 g/dL — ABNORMAL LOW (ref 13.0–17.0)
MCH: 27.6 pg (ref 26.0–34.0)
MCHC: 32.4 g/dL (ref 30.0–36.0)
MCV: 85 fL (ref 80.0–100.0)
Platelets: 244 10*3/uL (ref 150–400)
RBC: 4.28 MIL/uL (ref 4.22–5.81)
RDW: 13.2 % (ref 11.5–15.5)
WBC: 11 10*3/uL — ABNORMAL HIGH (ref 4.0–10.5)
nRBC: 0 % (ref 0.0–0.2)

## 2023-10-06 LAB — ECHOCARDIOGRAM COMPLETE
Area-P 1/2: 2.59 cm2
Height: 64 in
S' Lateral: 3.7 cm
Weight: 2560.86 [oz_av]

## 2023-10-06 LAB — URINE CULTURE: Culture: NO GROWTH

## 2023-10-06 LAB — RPR: RPR Ser Ql: NONREACTIVE

## 2023-10-06 MED ORDER — HYDROMORPHONE HCL 1 MG/ML IJ SOLN
0.5000 mg | Freq: Once | INTRAMUSCULAR | Status: AC | PRN
  Administered 2023-10-07: 0.5 mg via INTRAVENOUS
  Filled 2023-10-06: qty 0.5

## 2023-10-06 MED ORDER — LORAZEPAM 2 MG/ML IJ SOLN
0.5000 mg | Freq: Once | INTRAMUSCULAR | Status: AC
Start: 2023-10-06 — End: 2023-10-06
  Administered 2023-10-06: 0.5 mg via INTRAVENOUS
  Filled 2023-10-06: qty 1

## 2023-10-06 MED ORDER — HYDROMORPHONE HCL 1 MG/ML IJ SOLN
0.5000 mg | Freq: Once | INTRAMUSCULAR | Status: AC
  Administered 2023-10-06: 0.5 mg via INTRAVENOUS
  Filled 2023-10-06: qty 0.5

## 2023-10-06 NOTE — Progress Notes (Signed)
  Progress Note   Patient: Chris Randall:[id number] DOB: [birth year] DOA: 10/04/2023     1 DOS: the patient was seen and examined on 10/06/2023   Brief hospital course: Chris Randall is a 48 y.o. male with medical history significant for polysubstance use (EtOH, opioids, methamphetamine, cocaine, tobacco) with substance-induced mood disorder, GERD/Barrett's esophagus, homelessness who is admitted with UTI and suspected left pyelonephritis.  Assessment and Plan: Possible Urinary tract infection with scrotal cellulitis, r/o nec fas Patient presenting with chronic testicular pain and more acute LLQ and left flank pain.  UA abnormal.    Initial CT read as unremarkable, however images did not capture scrotum. Personally reviewed Dedicated CT pelvis confirmed very edematous scrotum. No evidence of subcutaneous emphysema -Scrotum remains very tender to light touch, edematous -Consulted Urology for further assistance -Discussed with ID pharmacist who agrees with continued Rocephin   Substance-induced mood disorder Psychosis/hallucinations/paranoia: Patient reports worsening paranoia and hallucinations.  He denies SI/HI.   -Psychiatry consulted  -cont abx per above - continue Seroquel  50 mg nightly   Polysubstance use disorder: Patient reports smoking half pack per day.  Nicotine  patch provided.  He says he quit alcohol years ago when he was admitted for upper GI bleeding.  He has a history of cocaine and amphetamine abuse but denies recent use.  History of opioid use disorder previously on Suboxone .  restarted on Suboxone  while in hospital.   GERD/Barrett's esophagus: Continue PPI.   Homelessness: TOC consult.      Subjective: Still having scrotal pain and swelling  Physical Exam: Vitals:   10/05/23 0817 10/05/23 0842 10/05/23 2124 10/06/23 0913  BP: 138/76 (!) 152/89 108/64 106/70  Pulse: (!) 112 92 74 75  Resp: 18 16 16 18   Temp: 99.9 F (37.7 C) 98.9 F (37.2 C) 98.8  F (37.1 C) 98.1 F (36.7 C)  TempSrc: Oral Oral Oral Oral  SpO2: 97% 98% 100% 98%  Weight:      Height:       General exam: Conversant, in no acute distress Respiratory system: normal chest rise, clear, no audible wheezing Cardiovascular system: regular rhythm, s1-s2 Gastrointestinal system: Nondistended, nontender, pos BS Central nervous system: No seizures, no tremors Extremities: No cyanosis, no joint deformities Skin: No rashes, no pallor Psychiatry: Affect normal // no auditory hallucinations   Data Reviewed:  Labs reviewed: Na 133, K 3.9, Cr 0.83, WBC 11.0, Hgb 11.8, Plts 244  Family Communication: Pt in room, family not at bedside  Disposition: Status is: Inpatient Continue inpatient stay because: severity of illness  Planned Discharge Destination: Home    Author: Cherylle Corwin, MD 10/06/2023 4:06 PM  For on call review [email or web address].

## 2023-10-06 NOTE — Plan of Care (Signed)
 Came to see Mr. Messler at the request of Dr. Joan Mouton to assess his scrotal cellulitis.  Patient was irritated at being awoken and refused physical examination.  I will formally consult when he agrees.  I have reviewed his imaging and while he does have significant scrotal thickening representative of his scrotal cellulitis, there are no fluid collections or gas pockets indicating developing Fournier's gangrene.  No surgical indication at this time.  I will follow him in the short-term if he agrees to examination going forward.

## 2023-10-06 NOTE — Progress Notes (Signed)
 Echocardiogram 2D Echocardiogram has been performed.  Emmaline Haring Brittannie Tawney RDCS 10/06/2023, 3:37 PM

## 2023-10-06 NOTE — Plan of Care (Signed)

## 2023-10-06 NOTE — Plan of Care (Signed)
    Problem: Clinical Measurements: Goal: Will remain free from infection Outcome: Progressing Goal: Respiratory complications will improve Outcome: Progressing Goal: Cardiovascular complication will be avoided Outcome: Progressing   Problem: Nutrition: Goal: Adequate nutrition will be maintained Outcome: Progressing   Problem: Health Behavior/Discharge Planning: Goal: Ability to manage health-related needs will improve Outcome: Not Progressing   Problem: Clinical Measurements: Goal: Ability to maintain clinical measurements within normal limits will improve Outcome: Not Progressing Goal: Diagnostic test results will improve Outcome: Not Progressing   Problem: Activity: Goal: Risk for activity intolerance will decrease Outcome: Not Progressing  Ate more today. Stools have decreased. Blood transfusion at present. Resistant to care.

## 2023-10-06 NOTE — Progress Notes (Signed)
 Patient leaving AMA. Educated by nursing staff of the risks of leaving the hospital before completion of treatment. IV removed. MD aware.

## 2023-10-06 NOTE — Progress Notes (Signed)
 Patient refused bedtime Lovenox and Seroquel . Patient stated he is in 10/10 pain. Pt also stated,"Im about to flip if I don't get my pain medicine and requested to speak with MD. Floor coverage notified.

## 2023-10-07 ENCOUNTER — Emergency Department (HOSPITAL_COMMUNITY)

## 2023-10-07 ENCOUNTER — Inpatient Hospital Stay (HOSPITAL_COMMUNITY)
Admission: EM | Admit: 2023-10-07 | Discharge: 2023-10-18 | Disposition: A | Discharge status: Home / Self Care | Source: Home / Self Care | Attending: Family Medicine | Admitting: Family Medicine

## 2023-10-07 ENCOUNTER — Other Ambulatory Visit: Payer: Self-pay

## 2023-10-07 DIAGNOSIS — G4700 Insomnia, unspecified: Secondary | ICD-10-CM | POA: Diagnosis present

## 2023-10-07 DIAGNOSIS — Z9152 Personal history of nonsuicidal self-harm: Secondary | ICD-10-CM

## 2023-10-07 DIAGNOSIS — F1514 Other stimulant abuse with stimulant-induced mood disorder: Secondary | ICD-10-CM | POA: Diagnosis present

## 2023-10-07 DIAGNOSIS — Z720 Tobacco use: Secondary | ICD-10-CM | POA: Diagnosis present

## 2023-10-07 DIAGNOSIS — B1920 Unspecified viral hepatitis C without hepatic coma: Secondary | ICD-10-CM | POA: Diagnosis present

## 2023-10-07 DIAGNOSIS — F1994 Other psychoactive substance use, unspecified with psychoactive substance-induced mood disorder: Secondary | ICD-10-CM | POA: Diagnosis present

## 2023-10-07 DIAGNOSIS — F1910 Other psychoactive substance abuse, uncomplicated: Secondary | ICD-10-CM

## 2023-10-07 DIAGNOSIS — N431 Infected hydrocele: Secondary | ICD-10-CM | POA: Diagnosis present

## 2023-10-07 DIAGNOSIS — N12 Tubulo-interstitial nephritis, not specified as acute or chronic: Secondary | ICD-10-CM | POA: Diagnosis present

## 2023-10-07 DIAGNOSIS — L309 Dermatitis, unspecified: Secondary | ICD-10-CM | POA: Diagnosis present

## 2023-10-07 DIAGNOSIS — Z79899 Other long term (current) drug therapy: Secondary | ICD-10-CM

## 2023-10-07 DIAGNOSIS — K2270 Barrett's esophagus without dysplasia: Secondary | ICD-10-CM | POA: Diagnosis present

## 2023-10-07 DIAGNOSIS — Z5900 Homelessness unspecified: Secondary | ICD-10-CM

## 2023-10-07 DIAGNOSIS — K219 Gastro-esophageal reflux disease without esophagitis: Secondary | ICD-10-CM | POA: Diagnosis present

## 2023-10-07 DIAGNOSIS — I8500 Esophageal varices without bleeding: Secondary | ICD-10-CM | POA: Diagnosis present

## 2023-10-07 DIAGNOSIS — F1414 Cocaine abuse with cocaine-induced mood disorder: Secondary | ICD-10-CM | POA: Diagnosis present

## 2023-10-07 DIAGNOSIS — R443 Hallucinations, unspecified: Secondary | ICD-10-CM

## 2023-10-07 DIAGNOSIS — R319 Hematuria, unspecified: Secondary | ICD-10-CM | POA: Diagnosis present

## 2023-10-07 DIAGNOSIS — F1114 Opioid abuse with opioid-induced mood disorder: Secondary | ICD-10-CM | POA: Diagnosis present

## 2023-10-07 DIAGNOSIS — J45909 Unspecified asthma, uncomplicated: Secondary | ICD-10-CM | POA: Diagnosis present

## 2023-10-07 DIAGNOSIS — N451 Epididymitis: Secondary | ICD-10-CM | POA: Diagnosis present

## 2023-10-07 DIAGNOSIS — F319 Bipolar disorder, unspecified: Secondary | ICD-10-CM | POA: Diagnosis present

## 2023-10-07 DIAGNOSIS — F22 Delusional disorders: Secondary | ICD-10-CM | POA: Diagnosis present

## 2023-10-07 DIAGNOSIS — F4310 Post-traumatic stress disorder, unspecified: Secondary | ICD-10-CM | POA: Diagnosis present

## 2023-10-07 DIAGNOSIS — F17210 Nicotine dependence, cigarettes, uncomplicated: Secondary | ICD-10-CM | POA: Diagnosis present

## 2023-10-07 DIAGNOSIS — Z9151 Personal history of suicidal behavior: Secondary | ICD-10-CM

## 2023-10-07 DIAGNOSIS — Z5329 Procedure and treatment not carried out because of patient's decision for other reasons: Secondary | ICD-10-CM | POA: Diagnosis present

## 2023-10-07 DIAGNOSIS — N433 Hydrocele, unspecified: Principal | ICD-10-CM

## 2023-10-07 DIAGNOSIS — F4322 Adjustment disorder with anxiety: Secondary | ICD-10-CM | POA: Diagnosis present

## 2023-10-07 DIAGNOSIS — Z885 Allergy status to narcotic agent status: Secondary | ICD-10-CM

## 2023-10-07 DIAGNOSIS — Z886 Allergy status to analgesic agent status: Secondary | ICD-10-CM

## 2023-10-07 DIAGNOSIS — Z8719 Personal history of other diseases of the digestive system: Secondary | ICD-10-CM

## 2023-10-07 DIAGNOSIS — Z8249 Family history of ischemic heart disease and other diseases of the circulatory system: Secondary | ICD-10-CM

## 2023-10-07 LAB — CBC WITH DIFFERENTIAL/PLATELET
Abs Immature Granulocytes: 0.02 10*3/uL (ref 0.00–0.07)
Basophils Absolute: 0.1 10*3/uL (ref 0.0–0.1)
Basophils Relative: 1 %
Eosinophils Absolute: 0.3 10*3/uL (ref 0.0–0.5)
Eosinophils Relative: 4 %
HCT: 39.4 % (ref 39.0–52.0)
Hemoglobin: 12.3 g/dL — ABNORMAL LOW (ref 13.0–17.0)
Immature Granulocytes: 0 %
Lymphocytes Relative: 17 %
Lymphs Abs: 1.1 10*3/uL (ref 0.7–4.0)
MCH: 27.6 pg (ref 26.0–34.0)
MCHC: 31.2 g/dL (ref 30.0–36.0)
MCV: 88.5 fL (ref 80.0–100.0)
Monocytes Absolute: 0.8 10*3/uL (ref 0.1–1.0)
Monocytes Relative: 13 %
Neutro Abs: 4.3 10*3/uL (ref 1.7–7.7)
Neutrophils Relative %: 65 %
Platelets: 292 10*3/uL (ref 150–400)
RBC: 4.45 MIL/uL (ref 4.22–5.81)
RDW: 13 % (ref 11.5–15.5)
WBC: 6.6 10*3/uL (ref 4.0–10.5)
nRBC: 0 % (ref 0.0–0.2)

## 2023-10-07 LAB — VITAMIN B12: Vitamin B-12: 238 pg/mL (ref 180–914)

## 2023-10-07 LAB — COMPREHENSIVE METABOLIC PANEL WITH GFR
ALT: 14 U/L (ref 0–44)
ALT: 17 U/L (ref 0–44)
AST: 13 U/L — ABNORMAL LOW (ref 15–41)
AST: 16 U/L (ref 15–41)
Albumin: 2.6 g/dL — ABNORMAL LOW (ref 3.5–5.0)
Albumin: 3.2 g/dL — ABNORMAL LOW (ref 3.5–5.0)
Alkaline Phosphatase: 64 U/L (ref 38–126)
Alkaline Phosphatase: 77 U/L (ref 38–126)
Anion gap: 7 (ref 5–15)
Anion gap: 8 (ref 5–15)
BUN: 7 mg/dL (ref 6–20)
BUN: 10 mg/dL (ref 6–20)
CO2: 25 mmol/L (ref 22–32)
CO2: 27 mmol/L (ref 22–32)
Calcium: 8.2 mg/dL — ABNORMAL LOW (ref 8.9–10.3)
Calcium: 8.4 mg/dL — ABNORMAL LOW (ref 8.9–10.3)
Chloride: 102 mmol/L (ref 98–111)
Chloride: 100 mmol/L (ref 98–111)
Creatinine, Ser: 0.76 mg/dL (ref 0.61–1.24)
Creatinine, Ser: 0.86 mg/dL (ref 0.61–1.24)
GFR, Estimated: >60 mL/min (ref >60)
GFR, Estimated: >60 mL/min (ref >60)
Glucose, Bld: 109 mg/dL — ABNORMAL HIGH (ref 70–99)
Glucose, Bld: 96 mg/dL (ref 70–99)
Potassium: 3.7 mmol/L (ref 3.5–5.1)
Potassium: 4.1 mmol/L (ref 3.5–5.1)
Sodium: 134 mmol/L — ABNORMAL LOW (ref 135–145)
Sodium: 135 mmol/L (ref 135–145)
Total Bilirubin: 0.5 mg/dL (ref 0.0–1.2)
Total Bilirubin: 0.5 mg/dL (ref 0.0–1.2)
Total Protein: 5.7 g/dL — ABNORMAL LOW (ref 6.5–8.1)
Total Protein: 6.8 g/dL (ref 6.5–8.1)

## 2023-10-07 LAB — URINALYSIS, ROUTINE W REFLEX MICROSCOPIC
Bilirubin Urine: NEGATIVE
Glucose, UA: NEGATIVE mg/dL
Hgb urine dipstick: NEGATIVE
Ketones, ur: NEGATIVE mg/dL
Leukocytes,Ua: NEGATIVE
Nitrite: NEGATIVE
Protein, ur: NEGATIVE mg/dL
Specific Gravity, Urine: 1.019 (ref 1.005–1.030)
pH: 5 (ref 5.0–8.0)

## 2023-10-07 LAB — CBC
HCT: 37.7 % — ABNORMAL LOW (ref 39.0–52.0)
Hemoglobin: 12.3 g/dL — ABNORMAL LOW (ref 13.0–17.0)
MCH: 27.3 pg (ref 26.0–34.0)
MCHC: 32.6 g/dL (ref 30.0–36.0)
MCV: 83.8 fL (ref 80.0–100.0)
Platelets: 263 10*3/uL (ref 150–400)
RBC: 4.5 MIL/uL (ref 4.22–5.81)
RDW: 13 % (ref 11.5–15.5)
WBC: 5.8 10*3/uL (ref 4.0–10.5)
nRBC: 0 % (ref 0.0–0.2)

## 2023-10-07 LAB — ETHANOL: Alcohol, Ethyl (B): <15 mg/dL (ref <15)

## 2023-10-07 LAB — HIV ANTIBODY (ROUTINE TESTING W REFLEX): HIV Screen 4th Generation wRfx: NONREACTIVE

## 2023-10-07 LAB — HEPATITIS C ANTIBODY: HCV Ab: REACTIVE — AB

## 2023-10-07 MED ORDER — HYDROMORPHONE HCL 1 MG/ML IJ SOLN
0.5000 mg | Freq: Once | INTRAMUSCULAR | Status: AC | PRN
  Administered 2023-10-07: 0.5 mg via INTRAVENOUS
  Filled 2023-10-07: qty 0.5

## 2023-10-07 MED ORDER — TRAMADOL HCL 50 MG PO TABS
50.0000 mg | ORAL_TABLET | Freq: Three times a day (TID) | ORAL | Status: DC | PRN
  Administered 2023-10-08 – 2023-10-18 (×15): 50 mg via ORAL
  Filled 2023-10-07 (×15): qty 1

## 2023-10-07 MED ORDER — VANCOMYCIN HCL 1500 MG/300ML IV SOLN
1500.0000 mg | Freq: Once | INTRAVENOUS | Status: AC
  Administered 2023-10-07: 1500 mg via INTRAVENOUS
  Filled 2023-10-07: qty 300

## 2023-10-07 MED ORDER — VANCOMYCIN HCL IN DEXTROSE 1-5 GM/200ML-% IV SOLN
1000.0000 mg | Freq: Two times a day (BID) | INTRAVENOUS | Status: DC
  Administered 2023-10-08 – 2023-10-13 (×11): 1000 mg via INTRAVENOUS
  Filled 2023-10-07 (×11): qty 200

## 2023-10-07 MED ORDER — PANTOPRAZOLE SODIUM 40 MG PO TBEC
40.0000 mg | DELAYED_RELEASE_TABLET | Freq: Every day | ORAL | Status: DC
  Administered 2023-10-07 – 2023-10-18 (×12): 40 mg via ORAL
  Filled 2023-10-07 (×12): qty 1

## 2023-10-07 MED ORDER — NICOTINE 21 MG/24HR TD PT24
21.0000 mg | MEDICATED_PATCH | Freq: Every day | TRANSDERMAL | Status: DC
Start: 2023-10-07 — End: 2023-10-16
  Administered 2023-10-07 – 2023-10-16 (×10): 21 mg via TRANSDERMAL
  Filled 2023-10-07 (×10): qty 1

## 2023-10-07 MED ORDER — SODIUM CHLORIDE 0.9 % IV SOLN
1.0000 g | Freq: Once | INTRAVENOUS | Status: DC
  Administered 2023-10-07: 1 g via INTRAVENOUS
  Filled 2023-10-07: qty 10

## 2023-10-07 MED ORDER — CEPHALEXIN 500 MG PO CAPS: 500.0000 mg | ORAL_CAPSULE | Freq: Two times a day (BID) | ORAL | 0 refills | Status: DC

## 2023-10-07 MED ORDER — OXYCODONE HCL 5 MG PO TABS
5.0000 mg | ORAL_TABLET | ORAL | Status: DC | PRN
  Administered 2023-10-07 – 2023-10-13 (×19): 5 mg via ORAL
  Filled 2023-10-07 (×21): qty 1

## 2023-10-07 MED ORDER — ONDANSETRON HCL 4 MG/2ML IJ SOLN
4.0000 mg | Freq: Four times a day (QID) | INTRAMUSCULAR | Status: DC | PRN
  Administered 2023-10-10 – 2023-10-15 (×4): 4 mg via INTRAVENOUS
  Filled 2023-10-07 (×4): qty 2

## 2023-10-07 MED ORDER — SODIUM CHLORIDE 0.9 % IV SOLN
2.0000 g | Freq: Three times a day (TID) | INTRAVENOUS | Status: DC
  Administered 2023-10-07 – 2023-10-13 (×17): 2 g via INTRAVENOUS
  Filled 2023-10-07 (×17): qty 12.5

## 2023-10-07 MED ORDER — DOCUSATE SODIUM 100 MG PO CAPS
100.0000 mg | ORAL_CAPSULE | Freq: Two times a day (BID) | ORAL | Status: DC
  Administered 2023-10-07 – 2023-10-18 (×21): 100 mg via ORAL
  Filled 2023-10-07 (×22): qty 1

## 2023-10-07 MED ORDER — MORPHINE SULFATE (PF) 2 MG/ML IV SOLN
2.0000 mg | INTRAVENOUS | Status: DC | PRN
  Administered 2023-10-07 – 2023-10-08 (×6): 2 mg via INTRAVENOUS
  Filled 2023-10-07 (×7): qty 1

## 2023-10-07 MED ORDER — MORPHINE SULFATE (PF) 4 MG/ML IV SOLN
4.0000 mg | Freq: Once | INTRAVENOUS | Status: AC
  Administered 2023-10-07: 4 mg via INTRAVENOUS
  Filled 2023-10-07: qty 1

## 2023-10-07 MED ORDER — ONDANSETRON HCL 4 MG PO TABS
4.0000 mg | ORAL_TABLET | Freq: Four times a day (QID) | ORAL | Status: DC | PRN
  Administered 2023-10-08: 4 mg via ORAL
  Filled 2023-10-07 (×2): qty 1
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
## 2023-10-07 NOTE — Progress Notes (Signed)
 Pharmacy Antibiotic Note  Chris Randall is a 48 y.o. male admitted on 10/07/2023 with cellulitis/complex hydrocele worsening on ceftriaxone therapy (has been on ceftriaxone monotherapy since 5/13). Pharmacy has been consulted for vancomycin and cefepime dosing.   Plan: Give vancomycin 1500mg  IV x1, followed by 1000mg  IV q12h (eAUC 484 using Scr 0.86 and Vd 0.72) Start cefepime 2g IV q8h Vanc levels PRN Monitor renal function, cultures, and overall clinical picture De-escalate abx as able    Temp (24hrs), Avg:98.6 F (37 C), Min:97.5 F (36.4 C), Max:99.5 F (37.5 C)  Recent Labs  Lab 10/04/23 1542 10/04/23 1730 10/05/23 0503 10/06/23 1300 10/07/23 0656 10/07/23 1337  WBC 19.0*  --  19.8* 11.0* 5.8 6.6  CREATININE 0.82  --  0.93 0.83 0.76 0.86  LATICACIDVEN  --  1.0  --   --   --   --     Estimated Creatinine Clearance: 98.1 mL/min (by C-G formula based on SCr of 0.86 mg/dL).    Allergies  Allergen Reactions   Motrin  [Ibuprofen ] Other (See Comments)    Hx of esophageal ulcers    Vicodin [Hydrocodone -Acetaminophen ] Other (See Comments)    Irritates esophageal ulcers     Antimicrobials this admission: 5/13 ceftriaxone >> 5/16 5/16 cefepime >>  5/16 vancomycin >>  Dose adjustments this admission: N/A  Microbiology results: 5/13 BCx: NG x3 days 5/14 Bcx: NG x2 days 5/13 UCx: NG    Thank you for allowing pharmacy to be a part of this patient's care.  Roselee Cong, PharmD Clinical Pharmacist  5/16/20254:10 PM
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
## 2023-10-07 NOTE — Plan of Care (Signed)

## 2023-10-07 NOTE — Progress Notes (Signed)
 Reassessed patient orientation, alert and oriented, just can say the exact day of the month, RN explained the risks of leaving AMA, patient understood, according to patient he is not waiting for urology to see him again if he can't get pain meds. [address] [facility] RN witnessed patient signed AMA.

## 2023-10-07 NOTE — Assessment & Plan Note (Addendum)
 Start PPI

## 2023-10-07 NOTE — Assessment & Plan Note (Addendum)
 48 year old presenting to ED with worsening pain, swelling of his left scrotum and imaging finding of infected/reactive hydrocele -admit to med surg -left [facility] AMA this morning and has now returned -broaden antibiotics to vanc/cefepime with drug hx/homelessness -GC/Chlamydia, RPR and HIV negative -UA today clean, follow up on previous culture  -urology consulted and has seen, recommended 10 days of antibiotics and to transition to oral once culture results. No findings or concerns for Fournier's. Urine culture no growth from 5/13. BC NTD.  -pain control with oral/IV for now  -elevate scrotum, recommended jock strap for when he leaves  -schedule colace

## 2023-10-07 NOTE — Plan of Care (Signed)
  Problem: Education: Goal: Knowledge of General Education information will improve Description: Including pain rating scale, medication(s)/side effects and non-pharmacologic comfort measures Outcome: Not Met (add Reason)   Problem: Health Behavior/Discharge Planning: Goal: Ability to manage health-related needs will improve Outcome: Not Met (add Reason)   Problem: Clinical Measurements: Goal: Ability to maintain clinical measurements within normal limits will improve Outcome: Not Met (add Reason) Goal: Will remain free from infection Outcome: Not Met (add Reason) Goal: Diagnostic test results will improve Outcome: Not Met (add Reason) Goal: Respiratory complications will improve Outcome: Not Met (add Reason) Goal: Cardiovascular complication will be avoided Outcome: Not Met (add Reason)   Problem: Activity: Goal: Risk for activity intolerance will decrease Outcome: Not Met (add Reason)   Problem: Nutrition: Goal: Adequate nutrition will be maintained Outcome: Not Met (add Reason)   Problem: Coping: Goal: Level of anxiety will decrease Outcome: Not Met (add Reason)   Problem: Elimination: Goal: Will not experience complications related to bowel motility Outcome: Not Met (add Reason) Goal: Will not experience complications related to urinary retention Outcome: Not Met (add Reason)   Problem: Pain Managment: Goal: General experience of comfort will improve and/or be controlled Outcome: Not Met (add Reason)   Problem: Safety: Goal: Ability to remain free from injury will improve Outcome: Not Met (add Reason)   Problem: Skin Integrity: Goal: Risk for impaired skin integrity will decrease Outcome: Not Met (add Reason)   Problem: Urinary Elimination: Goal: Signs and symptoms of infection will decrease Outcome: Not Met (add Reason)

## 2023-10-07 NOTE — Progress Notes (Signed)
 MD made aware, th\at patient wan\ts to leave because no pain meds in the PRN, pt. Complaint of testicle pain, Urology came 10/06/23, however patient refused to be seen and examined. Threatening to leave, per MD \\patient  can sign his AMA, because oriented x3 is patient's baseline. Patient know the month and year just not the exact day, oriented to self, place and situation.

## 2023-10-07 NOTE — Assessment & Plan Note (Addendum)
 UDS positive for cocaine, amphetamines and opiates on 10/05/23  He is wanting to go to rehab to get off meth Check hep C Ab Nicotine  patch  SW consult placed

## 2023-10-07 NOTE — Discharge Summary (Deleted)
 Physician Discharge Summary   Patient: Chris Randall MRN: [id number] DOB: 1975/09/28  Admit date:     10/07/2023  Discharge date: 10/07/23  Discharge Physician: Cherylle Corwin   PCP: Patient, No Pcp Per   PATIENT LEFT AGAINST MEDICAL ADVICE  Discharge Diagnoses: Principal Problem:   Infected hydrocele Active Problems:   Polysubstance abuse (HCC)   Substance induced mood disorder (HCC)   GERD/Barrett's esophagus/varices   Tobacco abuse  Resolved Problems:   * No resolved hospital problems. *  Hospital Course: 48 y.o. male with medical history significant for polysubstance use (EtOH, opioids, methamphetamine, cocaine, tobacco) with substance-induced mood disorder, GERD/Barrett's esophagus, homelessness who is admitted with UTI and suspected left pyelonephritis.   Assessment and Plan: Possible Urinary tract infection with scrotal cellulitis, r/o nec fas Patient presenting with chronic testicular pain and more acute LLQ and left flank pain.  UA abnormal.    Initial CT read as unremarkable, however images did not capture scrotum. Personally reviewed Dedicated CT pelvis confirmed very edematous scrotum. No evidence of subcutaneous emphysema -Scrotum remained very tender to light touch, edematous -Consulted Urology for further assistance, pt declined to talk to Urology on 5/15 -Patient as continued on Rocephin with normalization in WBC -on the morning of 5/16, pt elected to leave against medical advice. Pt was A&O. Of note, pt was earlier seen by psychiatry as well and cleared, as well as deemed alert and oriented   Substance-induced mood disorder Psychosis/hallucinations/paranoia: Patient reported worsening paranoia and hallucinations.  He denies SI/HI.   -Psychiatry consulted  -cont abx per above - continue Seroquel  50 mg nightly   Polysubstance use disorder: Patient reports smoking half pack per day.  Nicotine  patch provided.  He says he quit alcohol years ago when he was  admitted for upper GI bleeding.  He has a history of cocaine and amphetamine abuse but denies recent use.  History of opioid use disorder previously on Suboxone .  restarted on Suboxone  while in hospital.   GERD/Barrett's esophagus: Continue PPI.   Homelessness: TOC consult.    Consultants: Psychiatry, Urology Procedures performed:    DISCHARGE MEDICATION:  Condition at discharge: Left AMA  The results of significant diagnostics from this hospitalization (including imaging, microbiology, ancillary and laboratory) are listed below for reference.   Imaging Studies: US  SCROTUM W/DOPPLER Result Date: 10/07/2023 CLINICAL DATA:  Left testicular pain for 5 days. EXAM: SCROTAL ULTRASOUND DOPPLER ULTRASOUND OF THE TESTICLES TECHNIQUE: Complete ultrasound examination of the testicles, epididymis, and other scrotal structures was performed. Color and spectral Doppler ultrasound were also utilized to evaluate blood flow to the testicles. COMPARISON:  Oct 04, 2023. FINDINGS: Right testicle Measurements: 4.7 x 2.5 x 2.5 cm. No mass or microlithiasis visualized. Left testicle Measurements: 4.1 x 2.7 x 2.7 cm. No mass or microlithiasis visualized. Right epididymis:  Normal in size and appearance. Left epididymis:  Normal in size and appearance. Hydrocele: Small right hydrocele is again noted. There is now noted moderate size complex left hydrocele concerning for infection. Varicocele:  None visualized. Pulsed Doppler interrogation of both testes demonstrates normal low resistance arterial and venous waveforms bilaterally. IMPRESSION: No evidence of testicular mass or torsion. There is now noted moderate size complex left hydrocele with multiple septations concerning for infection. Electronically Signed   By: Rosalene Colon M.D.   On: 10/07/2023 14:37   ECHOCARDIOGRAM COMPLETE Result Date: 10/06/2023    ECHOCARDIOGRAM REPORT   Patient Name:   ANTIONIO SCHMELZLE Date of Exam: 10/06/2023 Medical Rec #:  [id number]  Height:       64.0 in Accession #:    [id number]         Weight:       160.1 lb Date of Birth:  March 31, 1976           BSA:          1.780 m Patient Age:    47 years           BP:           106/70 mmHg Patient Gender: M                  HR:           75 bpm. Exam Location:  Inpatient Procedure: 2D Echo, Color Doppler and Cardiac Doppler (Both Spectral and Color            Flow Doppler were utilized during procedure). Indications:    Bacteremia  History:        Patient has no prior history of Echocardiogram examinations.                 Risk Factors:Polysubstance Abuse.  Sonographer:    Sherline Distel Senior RDCS Referring Phys: [id number] Kepler Mccabe K Kaylub Detienne IMPRESSIONS  1. Left ventricular ejection fraction, by estimation, is 60 to 65%. The left ventricle has normal function. The left ventricle has no regional wall motion abnormalities. Left ventricular diastolic parameters were normal.  2. Right ventricular systolic function is normal. The right ventricular size is normal. Tricuspid regurgitation signal is inadequate for assessing PA pressure.  3. The mitral valve is normal in structure. No evidence of mitral valve regurgitation. No evidence of mitral stenosis.  4. The aortic valve is tricuspid. Aortic valve regurgitation is not visualized. Aortic valve sclerosis/calcification is present, without any evidence of aortic stenosis.  5. The inferior vena cava is dilated in size with <50% respiratory variability, suggesting right atrial pressure of 15 mmHg. Conclusion(s)/Recommendation(s): No evidence of valvular vegetations on this transthoracic echocardiogram. Consider a transesophageal echocardiogram to exclude infective endocarditis if clinically indicated. FINDINGS  Left Ventricle: Left ventricular ejection fraction, by estimation, is 60 to 65%. The left ventricle has normal function. The left ventricle has no regional wall motion abnormalities. The left ventricular internal cavity size was normal in size. There is  no left  ventricular hypertrophy. Left ventricular diastolic parameters were normal. Normal left ventricular filling pressure. Right Ventricle: The right ventricular size is normal. No increase in right ventricular wall thickness. Right ventricular systolic function is normal. Tricuspid regurgitation signal is inadequate for assessing PA pressure. Left Atrium: Left atrial size was normal in size. Right Atrium: Right atrial size was normal in size. Pericardium: There is no evidence of pericardial effusion. Mitral Valve: The mitral valve is normal in structure. No evidence of mitral valve regurgitation. No evidence of mitral valve stenosis. Tricuspid Valve: The tricuspid valve is normal in structure. Tricuspid valve regurgitation is trivial. No evidence of tricuspid stenosis. Aortic Valve: The aortic valve is tricuspid. Aortic valve regurgitation is not visualized. Aortic valve sclerosis/calcification is present, without any evidence of aortic stenosis. Pulmonic Valve: The pulmonic valve was normal in structure. Pulmonic valve regurgitation is not visualized. No evidence of pulmonic stenosis. Aorta: The aortic root is normal in size and structure. Venous: The inferior vena cava is dilated in size with less than 50% respiratory variability, suggesting right atrial pressure of 15 mmHg. IAS/Shunts: No atrial level shunt detected by color flow Doppler.  LEFT VENTRICLE PLAX 2D LVIDd:  5.40 cm   Diastology LVIDs:         3.70 cm   LV e' medial:    11.65 cm/s LV PW:         0.80 cm   LV E/e' medial:  7.0 LV IVS:        0.80 cm   LV e' lateral:   15.20 cm/s LVOT diam:     2.40 cm   LV E/e' lateral: 5.4 LV SV:         77 LV SV Index:   43 LVOT Area:     4.52 cm  RIGHT VENTRICLE RV S prime:     13.10 cm/s TAPSE (M-mode): 2.7 cm LEFT ATRIUM             Index        RIGHT ATRIUM           Index LA diam:        2.30 cm 1.29 cm/m   RA Area:     16.00 cm LA Vol (A2C):   52.2 ml 29.33 ml/m  RA Volume:   40.70 ml  22.87 ml/m LA Vol  (A4C):   57.0 ml 32.03 ml/m LA Biplane Vol: 57.3 ml 32.20 ml/m  AORTIC VALVE LVOT Vmax:   88.90 cm/s LVOT Vmean:  65.600 cm/s LVOT VTI:    0.170 m  AORTA Ao Root diam: 3.60 cm MITRAL VALVE MV Area (PHT): 2.59 cm    SHUNTS MV Decel Time: 293 msec    Systemic VTI:  0.17 m MV E velocity: 81.70 cm/s  Systemic Diam: 2.40 cm MV A velocity: 56.00 cm/s MV E/A ratio:  1.46 Gaylyn Keas MD Electronically signed by Gaylyn Keas MD Signature Date/Time: 10/06/2023/4:58:47 PM    Final    CT PELVIS W CONTRAST Result Date: 10/05/2023 CLINICAL DATA:  Scrotal cellulitis/swelling chronic testicle pain EXAM: CT PELVIS WITH CONTRAST TECHNIQUE: Multidetector CT imaging of the pelvis was performed using the standard protocol following the bolus administration of intravenous contrast. RADIATION DOSE REDUCTION: This exam was performed according to the departmental dose-optimization program which includes automated exposure control, adjustment of the mA and/or kV according to patient size and/or use of iterative reconstruction technique. CONTRAST:  75mL OMNIPAQUE  IOHEXOL  350 MG/ML SOLN COMPARISON:  CT 10/04/2023, scrotal ultrasound 10/04/2023 FINDINGS: Urinary Tract:  No abnormality visualized. Bowel:  Unremarkable visualized pelvic bowel loops. Vascular/Lymphatic: Aortic atherosclerosis. No aneurysm. No suspicious lymph nodes. Reproductive: Prostate unremarkable. Scrotal hydroceles. Marked scrotal edema. No evidence for rim enhancing abscess. Negative for soft tissue emphysema Other:  Negative for pelvic effusion or free air Musculoskeletal: No acute osseous abnormality. IMPRESSION: 1. Marked scrotal edema. No evidence for rim enhancing abscess or soft tissue emphysema. Scrotal hydroceles. 2. Aortic atherosclerosis. Aortic Atherosclerosis (ICD10-I70.0). Electronically Signed   By: Esmeralda Hedge M.D.   On: 10/05/2023 19:21   CT ABDOMEN PELVIS W CONTRAST Result Date: 10/04/2023 CLINICAL DATA:  Left lower quadrant abdominal pain. EXAM:  CT ABDOMEN AND PELVIS WITH CONTRAST TECHNIQUE: Multidetector CT imaging of the abdomen and pelvis was performed using the standard protocol following bolus administration of intravenous contrast. RADIATION DOSE REDUCTION: This exam was performed according to the departmental dose-optimization program which includes automated exposure control, adjustment of the mA and/or kV according to patient size and/or use of iterative reconstruction technique. CONTRAST:  75mL OMNIPAQUE  IOHEXOL  350 MG/ML SOLN COMPARISON:  04/28/2022 FINDINGS: Lower chest: No acute abnormality. Hepatobiliary: Stable 1.3 cm right hepatic cyst. Gallbladder is unremarkable. No biliary dilatation. Pancreas:  Unremarkable. No pancreatic ductal dilatation or surrounding inflammatory changes. Spleen: Normal in size without focal abnormality. Adrenals/Urinary Tract: Adrenal glands are unremarkable. Kidneys are normal, without renal calculi, suspicious focal lesion, or hydronephrosis. Bladder is unremarkable. Stomach/Bowel: Stomach is within normal limits. Appendix appears normal. No evidence of bowel wall thickening, distention, or inflammatory changes. Moderate volume of stool throughout the colon. Vascular/Lymphatic: Abdominal aorta is normal in caliber with aortoiliac atherosclerotic calcification again noted. No enlarged abdominal or pelvic lymph nodes. Reproductive: Prostate is unremarkable. Other: No abdominal wall hernia or abnormality. No abdominopelvic ascites. No free air. Musculoskeletal: No acute or significant osseous findings. IMPRESSION: 1. No acute localizing findings in the abdomen or pelvis. 2.  Aortic Atherosclerosis (ICD10-I70.0). Electronically Signed   By: Mannie Seek M.D.   On: 10/04/2023 18:16   US  SCROTUM W/DOPPLER Result Date: 10/04/2023 CLINICAL DATA:  Left scrotal pain for 2 months.  Worsening x1 day. EXAM: SCROTAL ULTRASOUND DOPPLER ULTRASOUND OF THE TESTICLES TECHNIQUE: Complete ultrasound examination of the testicles,  epididymis, and other scrotal structures was performed. Color and spectral Doppler ultrasound were also utilized to evaluate blood flow to the testicles. COMPARISON:  AP pelvis 05/04/2016 FINDINGS: Right testicle Measurements: 4.9 x 2.0 x 3.2 cm. No mass or microlithiasis visualized. Left testicle Measurements: 4.7 x 2.7 x 3.1 cm. No mass or microlithiasis visualized. Right epididymis:  Normal in size and appearance. Left epididymis:  Normal in size and appearance. Hydrocele:  Mild left and minimal right hydroceles. Varicocele:  None visualized. Pulsed Doppler interrogation of both testes demonstrates normal low resistance arterial and venous waveforms bilaterally. IMPRESSION: 1. Mild left and minimal right hydroceles. 2. Otherwise normal scrotal ultrasound. Electronically Signed   By: Bertina Broccoli M.D.   On: 10/04/2023 16:24    Microbiology: Results for orders placed or performed during the hospital encounter of 10/04/23  Urine Culture     Status: None   Collection Time: 10/04/23  5:11 PM   Specimen: Urine, Clean Catch  Result Value Ref Range Status   Specimen Description URINE, CLEAN CATCH  Final   Special Requests NONE  Final   Culture   Final    NO GROWTH Performed at [facility], [address] [place], [place] [address]    Report Status 10/06/2023 FINAL  Final  Blood culture (routine x 2)     Status: None (Preliminary result)   Collection Time: 10/04/23  5:30 PM   Specimen: BLOOD  Result Value Ref Range Status   Specimen Description BLOOD RIGHT ANTECUBITAL  Final   Special Requests   Final    BOTTLES DRAWN AEROBIC AND ANAEROBIC Blood Culture results may not be optimal due to an inadequate volume of blood received in culture bottles   Culture   Final    NO GROWTH 3 DAYS Performed at [facility], [address], [place], [place] [address]    Report Status PENDING  Incomplete  Culture, blood (Routine X 2) w Reflex to ID Panel     Status: None (Preliminary result)    Collection Time: 10/05/23  5:03 AM   Specimen: BLOOD  Result Value Ref Range Status   Specimen Description BLOOD LEFT ANTECUBITAL  Final   Special Requests   Final    BOTTLES DRAWN AEROBIC AND ANAEROBIC Blood Culture results may not be optimal due to an inadequate volume of blood received in culture bottles   Culture   Final    NO GROWTH 2 DAYS Performed at [facility],  [address], [place], [place] [address]    Report Status PENDING  Incomplete    Labs: CBC: Recent Labs  Lab 10/04/23 1542 10/05/23 0503 10/06/23 1300 10/07/23 0656 10/07/23 1337  WBC 19.0* 19.8* 11.0* 5.8 6.6  NEUTROABS 17.1*  --   --   --  4.3  HGB 13.5 12.7* 11.8* 12.3* 12.3*  HCT 41.8 37.7* 36.4* 37.7* 39.4  MCV 85.5 83.0 85.0 83.8 88.5  PLT 289 293 244 263 292   Basic Metabolic Panel: Recent Labs  Lab 10/04/23 1542 10/05/23 0503 10/06/23 1300 10/07/23 0656 10/07/23 1337  NA 137 133* 133* 134* 135  K 3.7 3.6 3.9 3.7 4.1  CL 102 101 101 102 100  CO2 22 25 22 25 27   GLUCOSE 144* 87 181* 109* 96  BUN 14 13 9 7 10   CREATININE 0.82 0.93 0.83 0.76 0.86  CALCIUM 9.0 8.4* 8.0* 8.2* 8.4*   Liver Function Tests: Recent Labs  Lab 10/04/23 1542 10/06/23 1300 10/07/23 0656 10/07/23 1337  AST 33 15 13* 16  ALT 25 14 14 17   ALKPHOS 74 55 64 77  BILITOT 1.6* 0.5 0.5 0.5  PROT 6.2* 5.9* 5.7* 6.8  ALBUMIN 3.6 2.8* 2.6* 3.2*   CBG: No results for input(s): "GLUCAP" in the last 168 hours.   Signed: Cherylle Corwin, MD [facility] 10/07/2023

## 2023-10-07 NOTE — Hospital Course (Signed)
 48 y.o. male with medical history significant for polysubstance use (EtOH, opioids, methamphetamine, cocaine, tobacco) with substance-induced mood disorder, GERD/Barrett's esophagus, homelessness who is admitted with UTI and suspected left pyelonephritis.

## 2023-10-07 NOTE — Consult Note (Signed)
 Urology Consult Note   Requesting Attending Physician:  Mozell Arias, MD Service Providing Consult: Urology  Consulting Attending: Dr. Jarvis Mesa   Reason for Consult:  left testicular pain  HPI: Chris Randall is seen in consultation for reasons noted above at the request of Mozell Arias, MD. patient is a 48 year old male initially presenting to [facility] emergency department on 10/05/2023 for left testicular pain x 3 months and painful urination x 1 month.  PMH significant for probably substance use-EtOH, opioids, methamphetamine, cocaine, and tobacco with substance induced mood disorder.  He was admitted for psychosis/paranoia, leukocytosis, and concern for developing Fournier's gangrene.  Scrotal ultrasound and CT showed significant scrotal thickening though no fluid collections or gas was appreciated.  Patient would not allow me to physically examine him and insisted I leave and come back later.  I attempted to come back again today only to find an empty room, as he had left AMA due to hallucinations of people trying to kill him.  This afternoon he is presented to [facility] emergency department with same complaints.  Repeat ultrasound showed improvement of scrotal thickening but a more clearly defined reactive septated hydrocele.  Urinalysis was unremarkable, not surprising following his antibiotic treatment yesterday.  Patient was sleeping on my arrival.  He tolerated physical exam with some difficulty.  I made clear that he would need to stay in hospital for antibiotic treatment. He expressed understanding and does not feel that anyone here is going to kill him.  ------------------  Assessment:  48 y.o. male with left reactive hydrocele   Recommendations: # Left reactive hydrocele Repeat scrotal US  shows clear loculations and development of reactive hydrocele.  No immediate concern for Fournier's.  Scrotal thickening was not remarkable on physical exam.  Agree with  restarting broad IV ABX for 10d total treatment. Pain should improve within the first few days, whereas swelling may take as much as 6 weeks. Can consider transition to oral treatment once symptoms improve.   Supportive briefs at home are recommended. Keep scrotum elevated in the meantime.   Will see him intermittently.   Case and plan discussed with Dr. 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    Bronchitis    Esophageal varices (HCC) 2003 vs 2008   pt reprted varices on EGD in [place] "10 to 15 years ago. No varices or portal gastropathy on EGD 08/02/16   Hematemesis 08/02/2016   Hx GERD, findings on 08/02/16 EGD: esophagitis LA grade B, ?underlying Barrett's esphagus; 4 cm HH; hemorrhagic gastritis and red blood in stomach (source or bleeding); NO VARICES OR PORTALGASTROPATHY.    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   Opioid, cocaine, alcohol    Past Surgical History:  Past Surgical History:  Procedure Laterality Date   ESOPHAGOGASTRODUODENOSCOPY (EGD) WITH PROPOFOL  N/A 08/02/2016   Procedure: ESOPHAGOGASTRODUODENOSCOPY (EGD) WITH PROPOFOL ;  Surgeon: Kenney Peacemaker, MD;  Location: [facility] ENDOSCOPY;  Service: Endoscopy;  Laterality: N/A;   ROTATOR CUFF REPAIR  1995   SHOULDER SURGERY      Medication: Current Facility-Administered Medications  Medication Dose Route Frequency Provider Last Rate Last Admin   cefTRIAXone (ROCEPHIN) 1 g in sodium chloride  0.9 % 100 mL IVPB  1 g Intravenous Once Spence Dux, PA-C       Current Outpatient Medications  Medication Sig Dispense Refill   buprenorphine -naloxone  (SUBOXONE ) 2-0.5 mg SUBL SL tablet Place 1 tablet under the tongue daily. (Patient not taking: Reported on 10/07/2023)     cephALEXin  (KEFLEX ) 500 MG capsule Take 1 capsule (500 mg total) by  mouth 2 (two) times daily for 5 days. (Patient not taking: Reported on 10/07/2023) 10 capsule 0   hydrocortisone  cream 1 % Apply topically 2 (two) times daily. (Patient not taking: Reported on 10/04/2023) 30 g 0   QUEtiapine  (SEROQUEL ) 50 MG tablet Take 1 tablet (50 mg total) by mouth at bedtime. (Patient not taking: Reported on 10/07/2023) 30 tablet 0   Facility-Administered Medications Ordered in Other Encounters  Medication Dose Route Frequency Provider Last Rate Last Admin   [COMPLETED] HYDROmorphone  (DILAUDID ) injection 0.5 mg  0.5 mg Intravenous Once PRN Oral Billings, MD   0.5 mg at 10/07/23 0940    Allergies: Allergies  Allergen Reactions   Motrin  [Ibuprofen ] Other (See Comments)    Hx of esophageal ulcers    Vicodin [Hydrocodone -Acetaminophen ] Other (See Comments)    Irritates esophageal ulcers     Social History: Social History   Tobacco Use   Smoking status: Every Day    Current packs/day: 0.50    Average packs/day: 0.5 packs/day for 15.0 years (7.5 ttl pk-yrs)    Types: Cigarettes   Smokeless tobacco: Current    Types: Chew  Vaping Use   Vaping status: Never Used  Substance Use Topics   Alcohol use: Not Currently    Comment: drank 1/2 gallon yesterday - rarely drinks r/t stomach ulcers usually   Drug use: Not Currently    Frequency: 7.0 times per week    Types: "Crack" cocaine, Cocaine, Hydrocodone , Oxycodone     Family History Family History  Problem Relation Age of Onset   Heart failure Mother    Hypertension Other    Cancer Other     Review of Systems  Genitourinary:        Left testicular pain     Objective   Vital signs in last 24 hours: BP 114/71 (BP Location: Left Arm)   Pulse 89   Temp (!) 97.5 F (36.4 C) (Oral)   Resp 16   SpO2 100%   Physical Exam General: A&O, resting, appropriate HEENT: [place]/AT Pulmonary: Normal work of breathing Cardiovascular: no cyanosis Abdomen: Soft, NTTP, nondistended GU: Enlarged exquisitely sensitive left  hemiscrotum.  Consistent with reactive hydrocele noted on imaging. Neuro: Appropriate, no focal neurological deficits  Most Recent Labs: Lab Results  Component Value Date   WBC 6.6 10/07/2023   HGB 12.3 (L) 10/07/2023   HCT 39.4 10/07/2023   PLT 292 10/07/2023    Lab Results  Component Value Date   NA 135 10/07/2023   K 4.1 10/07/2023   CL 100 10/07/2023   CO2 27 10/07/2023   BUN 10 10/07/2023   CREATININE 0.86 10/07/2023   CALCIUM 8.4 (L) 10/07/2023   PHOS 3.5 09/10/2009    Lab Results  Component Value Date   INR 1.2 01/25/2022   APTT 36 09/09/2009     Urine Culture: @[id number] (laburin,org,[id number])@   IMAGING: US  SCROTUM W/DOPPLER Result Date: 10/07/2023 CLINICAL DATA:  Left testicular pain for 5 days. EXAM: SCROTAL ULTRASOUND DOPPLER ULTRASOUND OF THE TESTICLES TECHNIQUE: Complete ultrasound examination of the testicles, epididymis, and other scrotal structures was performed. Color and spectral Doppler ultrasound were also utilized to evaluate blood flow to the testicles. COMPARISON:  Oct 04, 2023. FINDINGS: Right testicle Measurements: 4.7 x 2.5 x  2.5 cm. No mass or microlithiasis visualized. Left testicle Measurements: 4.1 x 2.7 x 2.7 cm. No mass or microlithiasis visualized. Right epididymis:  Normal in size and appearance. Left epididymis:  Normal in size and appearance. Hydrocele: Small right hydrocele is again noted. There is now noted moderate size complex left hydrocele concerning for infection. Varicocele:  None visualized. Pulsed Doppler interrogation of both testes demonstrates normal low resistance arterial and venous waveforms bilaterally. IMPRESSION: No evidence of testicular mass or torsion. There is now noted moderate size complex left hydrocele with multiple septations concerning for infection. Electronically Signed   By: Rosalene Colon M.D.   On: 10/07/2023 14:37   ECHOCARDIOGRAM COMPLETE Result Date: 10/06/2023    ECHOCARDIOGRAM REPORT   Patient Name:    Chris Randall Date of Exam: 10/06/2023 Medical Rec #:  [id number]          Height:       64.0 in Accession #:    [id number]         Weight:       160.1 lb Date of Birth:  12/27/1975           BSA:          1.780 m Patient Age:    47 years           BP:           106/70 mmHg Patient Gender: M                  HR:           75 bpm. Exam Location:  Inpatient Procedure: 2D Echo, Color Doppler and Cardiac Doppler (Both Spectral and Color            Flow Doppler were utilized during procedure). Indications:    Bacteremia  History:        Patient has no prior history of Echocardiogram examinations.                 Risk Factors:Polysubstance Abuse.  Sonographer:    Sherline Distel Senior RDCS Referring Phys: [id number] CHIU IMPRESSIONS  1. Left ventricular ejection fraction, by estimation, is 60 to 65%. The left ventricle has normal function. The left ventricle has no regional wall motion abnormalities. Left ventricular diastolic parameters were normal.  2. Right ventricular systolic function is normal. The right ventricular size is normal. Tricuspid regurgitation signal is inadequate for assessing PA pressure.  3. The mitral valve is normal in structure. No evidence of mitral valve regurgitation. No evidence of mitral stenosis.  4. The aortic valve is tricuspid. Aortic valve regurgitation is not visualized. Aortic valve sclerosis/calcification is present, without any evidence of aortic stenosis.  5. The inferior vena cava is dilated in size with <50% respiratory variability, suggesting right atrial pressure of 15 mmHg. Conclusion(s)/Recommendation(s): No evidence of valvular vegetations on this transthoracic echocardiogram. Consider a transesophageal echocardiogram to exclude infective endocarditis if clinically indicated. FINDINGS  Left Ventricle: Left ventricular ejection fraction, by estimation, is 60 to 65%. The left ventricle has normal function. The left ventricle has no regional wall motion abnormalities. The left  ventricular internal cavity size was normal in size. There is  no left ventricular hypertrophy. Left ventricular diastolic parameters were normal. Normal left ventricular filling pressure. Right Ventricle: The right ventricular size is normal. No increase in right ventricular wall thickness. Right ventricular systolic function is normal. Tricuspid regurgitation signal is inadequate for assessing PA pressure. Left Atrium: Left atrial size was  normal in size. Right Atrium: Right atrial size was normal in size. Pericardium: There is no evidence of pericardial effusion. Mitral Valve: The mitral valve is normal in structure. No evidence of mitral valve regurgitation. No evidence of mitral valve stenosis. Tricuspid Valve: The tricuspid valve is normal in structure. Tricuspid valve regurgitation is trivial. No evidence of tricuspid stenosis. Aortic Valve: The aortic valve is tricuspid. Aortic valve regurgitation is not visualized. Aortic valve sclerosis/calcification is present, without any evidence of aortic stenosis. Pulmonic Valve: The pulmonic valve was normal in structure. Pulmonic valve regurgitation is not visualized. No evidence of pulmonic stenosis. Aorta: The aortic root is normal in size and structure. Venous: The inferior vena cava is dilated in size with less than 50% respiratory variability, suggesting right atrial pressure of 15 mmHg. IAS/Shunts: No atrial level shunt detected by color flow Doppler.  LEFT VENTRICLE PLAX 2D LVIDd:         5.40 cm   Diastology LVIDs:         3.70 cm   LV e' medial:    11.65 cm/s LV PW:         0.80 cm   LV E/e' medial:  7.0 LV IVS:        0.80 cm   LV e' lateral:   15.20 cm/s LVOT diam:     2.40 cm   LV E/e' lateral: 5.4 LV SV:         77 LV SV Index:   43 LVOT Area:     4.52 cm  RIGHT VENTRICLE RV S prime:     13.10 cm/s TAPSE (M-mode): 2.7 cm LEFT ATRIUM             Index        RIGHT ATRIUM           Index LA diam:        2.30 cm 1.29 cm/m   RA Area:     16.00 cm LA Vol  (A2C):   52.2 ml 29.33 ml/m  RA Volume:   40.70 ml  22.87 ml/m LA Vol (A4C):   57.0 ml 32.03 ml/m LA Biplane Vol: 57.3 ml 32.20 ml/m  AORTIC VALVE LVOT Vmax:   88.90 cm/s LVOT Vmean:  65.600 cm/s LVOT VTI:    0.170 m  AORTA Ao Root diam: 3.60 cm MITRAL VALVE MV Area (PHT): 2.59 cm    SHUNTS MV Decel Time: 293 msec    Systemic VTI:  0.17 m MV E velocity: 81.70 cm/s  Systemic Diam: 2.40 cm MV A velocity: 56.00 cm/s MV E/A ratio:  1.46 Gaylyn Keas MD Electronically signed by Gaylyn Keas MD Signature Date/Time: 10/06/2023/4:58:47 PM    Final    CT PELVIS W CONTRAST Result Date: 10/05/2023 CLINICAL DATA:  Scrotal cellulitis/swelling chronic testicle pain EXAM: CT PELVIS WITH CONTRAST TECHNIQUE: Multidetector CT imaging of the pelvis was performed using the standard protocol following the bolus administration of intravenous contrast. RADIATION DOSE REDUCTION: This exam was performed according to the departmental dose-optimization program which includes automated exposure control, adjustment of the mA and/or kV according to patient size and/or use of iterative reconstruction technique. CONTRAST:  75mL OMNIPAQUE  IOHEXOL  350 MG/ML SOLN COMPARISON:  CT 10/04/2023, scrotal ultrasound 10/04/2023 FINDINGS: Urinary Tract:  No abnormality visualized. Bowel:  Unremarkable visualized pelvic bowel loops. Vascular/Lymphatic: Aortic atherosclerosis. No aneurysm. No suspicious lymph nodes. Reproductive: Prostate unremarkable. Scrotal hydroceles. Marked scrotal edema. No evidence for rim enhancing abscess. Negative for soft tissue emphysema Other:  Negative  for pelvic effusion or free air Musculoskeletal: No acute osseous abnormality. IMPRESSION: 1. Marked scrotal edema. No evidence for rim enhancing abscess or soft tissue emphysema. Scrotal hydroceles. 2. Aortic atherosclerosis. Aortic Atherosclerosis (ICD10-I70.0). Electronically Signed   By: Esmeralda Hedge M.D.   On: 10/05/2023 19:21    ------  Alla Ar,  NP Pager: [phone]   Please contact the urology consult pager with any further questions/concerns.

## 2023-10-07 NOTE — ED Triage Notes (Signed)
 Pt came in for left testicle swelling that started three days ago. Pt stated there's a lot of pressure and it burns when he urinates.

## 2023-10-07 NOTE — Discharge Summary (Signed)
 Physician Discharge Summary   Patient: Chris Randall MRN: [id number] DOB: [birth year]  Admit date:     10/04/2023  Discharge date: 10/07/23  Discharge Physician: Cherylle Corwin   PCP: Patient, No Pcp Per   PATIENT LEFT AGAINST MEDICAL ADVICE  Discharge Diagnoses: Principal Problem:   UTI (urinary tract infection) Active Problems:   Substance induced mood disorder (HCC)   GERD/Barrett's esophagus/varices   Tobacco abuse   Severe episode of recurrent major depressive disorder, without psychotic features (HCC)   Cellulitis of scrotum  Resolved Problems:   * No resolved hospital problems. *  Hospital Course: Chris Randall is a 48 y.o. male with medical history significant for polysubstance use (EtOH, opioids, methamphetamine, cocaine, tobacco) with substance-induced mood disorder, GERD/Barrett's esophagus, homelessness who is admitted with UTI and suspected left pyelonephritis.  Assessment and Plan: Possible Urinary tract infection with scrotal cellulitis, r/o nec fas Patient presenting with chronic testicular pain and more acute LLQ and left flank pain.  UA abnormal.    Initial CT read as unremarkable, however images did not capture scrotum. Personally reviewed Dedicated CT pelvis confirmed very edematous scrotum. No evidence of subcutaneous emphysema -Scrotum remained very tender to light touch, edematous -Consulted Urology for further assistance, pt declined to talk to Urology on 5/15 -Patient as continued on Rocephin with normalization in WBC -on the morning of 5/16, pt elected to leave against medical advice. Pt was A&O. Of note, pt was earlier seen by psychiatry as well and cleared, as well as deemed alert and oriented   Substance-induced mood disorder Psychosis/hallucinations/paranoia: Patient reported worsening paranoia and hallucinations.  He denies SI/HI.   -Psychiatry consulted  -cont abx per above - continue Seroquel  50 mg nightly   Polysubstance use  disorder: Patient reports smoking half pack per day.  Nicotine  patch provided.  He says he quit alcohol years ago when he was admitted for upper GI bleeding.  He has a history of cocaine and amphetamine abuse but denies recent use.  History of opioid use disorder previously on Suboxone .  restarted on Suboxone  while in hospital.   GERD/Barrett's esophagus: Continue PPI.   Homelessness: TOC consult.    Consultants: Psychiatry, Urology Procedures performed:    DISCHARGE MEDICATION:  Condition at discharge: Left AMA  The results of significant diagnostics from this hospitalization (including imaging, microbiology, ancillary and laboratory) are listed below for reference.   Imaging Studies: US  SCROTUM W/DOPPLER Result Date: 10/07/2023 CLINICAL DATA:  Left testicular pain for 5 days. EXAM: SCROTAL ULTRASOUND DOPPLER ULTRASOUND OF THE TESTICLES TECHNIQUE: Complete ultrasound examination of the testicles, epididymis, and other scrotal structures was performed. Color and spectral Doppler ultrasound were also utilized to evaluate blood flow to the testicles. COMPARISON:  Oct 04, 2023. FINDINGS: Right testicle Measurements: 4.7 x 2.5 x 2.5 cm. No mass or microlithiasis visualized. Left testicle Measurements: 4.1 x 2.7 x 2.7 cm. No mass or microlithiasis visualized. Right epididymis:  Normal in size and appearance. Left epididymis:  Normal in size and appearance. Hydrocele: Small right hydrocele is again noted. There is now noted moderate size complex left hydrocele concerning for infection. Varicocele:  None visualized. Pulsed Doppler interrogation of both testes demonstrates normal low resistance arterial and venous waveforms bilaterally. IMPRESSION: No evidence of testicular mass or torsion. There is now noted moderate size complex left hydrocele with multiple septations concerning for infection. Electronically Signed   By: Rosalene Colon M.D.   On: 10/07/2023 14:37   ECHOCARDIOGRAM COMPLETE Result  Date: 10/06/2023  ECHOCARDIOGRAM REPORT   Patient Name:   Chris Randall Date of Exam: 10/06/2023 Medical Rec #:  [id number]          Height:       64.0 in Accession #:    [id number]         Weight:       160.1 lb Date of Birth:  12/13/1975           BSA:          1.780 m Patient Age:    47 years           BP:           106/70 mmHg Patient Gender: M                  HR:           75 bpm. Exam Location:  Inpatient Procedure: 2D Echo, Color Doppler and Cardiac Doppler (Both Spectral and Color            Flow Doppler were utilized during procedure). Indications:    Bacteremia  History:        Patient has no prior history of Echocardiogram examinations.                 Risk Factors:Polysubstance Abuse.  Sonographer:    Sherline Distel Senior RDCS Referring Phys: [id number] K Rakeen Gaillard IMPRESSIONS  1. Left ventricular ejection fraction, by estimation, is 60 to 65%. The left ventricle has normal function. The left ventricle has no regional wall motion abnormalities. Left ventricular diastolic parameters were normal.  2. Right ventricular systolic function is normal. The right ventricular size is normal. Tricuspid regurgitation signal is inadequate for assessing PA pressure.  3. The mitral valve is normal in structure. No evidence of mitral valve regurgitation. No evidence of mitral stenosis.  4. The aortic valve is tricuspid. Aortic valve regurgitation is not visualized. Aortic valve sclerosis/calcification is present, without any evidence of aortic stenosis.  5. The inferior vena cava is dilated in size with <50% respiratory variability, suggesting right atrial pressure of 15 mmHg. Conclusion(s)/Recommendation(s): No evidence of valvular vegetations on this transthoracic echocardiogram. Consider a transesophageal echocardiogram to exclude infective endocarditis if clinically indicated. FINDINGS  Left Ventricle: Left ventricular ejection fraction, by estimation, is 60 to 65%. The left ventricle has normal function. The left  ventricle has no regional wall motion abnormalities. The left ventricular internal cavity size was normal in size. There is  no left ventricular hypertrophy. Left ventricular diastolic parameters were normal. Normal left ventricular filling pressure. Right Ventricle: The right ventricular size is normal. No increase in right ventricular wall thickness. Right ventricular systolic function is normal. Tricuspid regurgitation signal is inadequate for assessing PA pressure. Left Atrium: Left atrial size was normal in size. Right Atrium: Right atrial size was normal in size. Pericardium: There is no evidence of pericardial effusion. Mitral Valve: The mitral valve is normal in structure. No evidence of mitral valve regurgitation. No evidence of mitral valve stenosis. Tricuspid Valve: The tricuspid valve is normal in structure. Tricuspid valve regurgitation is trivial. No evidence of tricuspid stenosis. Aortic Valve: The aortic valve is tricuspid. Aortic valve regurgitation is not visualized. Aortic valve sclerosis/calcification is present, without any evidence of aortic stenosis. Pulmonic Valve: The pulmonic valve was normal in structure. Pulmonic valve regurgitation is not visualized. No evidence of pulmonic stenosis. Aorta: The aortic root is normal in size and structure. Venous: The inferior vena cava is dilated in size with  less than 50% respiratory variability, suggesting right atrial pressure of 15 mmHg. IAS/Shunts: No atrial level shunt detected by color flow Doppler.  LEFT VENTRICLE PLAX 2D LVIDd:         5.40 cm   Diastology LVIDs:         3.70 cm   LV e' medial:    11.65 cm/s LV PW:         0.80 cm   LV E/e' medial:  7.0 LV IVS:        0.80 cm   LV e' lateral:   15.20 cm/s LVOT diam:     2.40 cm   LV E/e' lateral: 5.4 LV SV:         77 LV SV Index:   43 LVOT Area:     4.52 cm  RIGHT VENTRICLE RV S prime:     13.10 cm/s TAPSE (M-mode): 2.7 cm LEFT ATRIUM             Index        RIGHT ATRIUM           Index LA  diam:        2.30 cm 1.29 cm/m   RA Area:     16.00 cm LA Vol (A2C):   52.2 ml 29.33 ml/m  RA Volume:   40.70 ml  22.87 ml/m LA Vol (A4C):   57.0 ml 32.03 ml/m LA Biplane Vol: 57.3 ml 32.20 ml/m  AORTIC VALVE LVOT Vmax:   88.90 cm/s LVOT Vmean:  65.600 cm/s LVOT VTI:    0.170 m  AORTA Ao Root diam: 3.60 cm MITRAL VALVE MV Area (PHT): 2.59 cm    SHUNTS MV Decel Time: 293 msec    Systemic VTI:  0.17 m MV E velocity: 81.70 cm/s  Systemic Diam: 2.40 cm MV A velocity: 56.00 cm/s MV E/A ratio:  1.46 Gaylyn Keas MD Electronically signed by Gaylyn Keas MD Signature Date/Time: 10/06/2023/4:58:47 PM    Final    CT PELVIS W CONTRAST Result Date: 10/05/2023 CLINICAL DATA:  Scrotal cellulitis/swelling chronic testicle pain EXAM: CT PELVIS WITH CONTRAST TECHNIQUE: Multidetector CT imaging of the pelvis was performed using the standard protocol following the bolus administration of intravenous contrast. RADIATION DOSE REDUCTION: This exam was performed according to the departmental dose-optimization program which includes automated exposure control, adjustment of the mA and/or kV according to patient size and/or use of iterative reconstruction technique. CONTRAST:  75mL OMNIPAQUE  IOHEXOL  350 MG/ML SOLN COMPARISON:  CT 10/04/2023, scrotal ultrasound 10/04/2023 FINDINGS: Urinary Tract:  No abnormality visualized. Bowel:  Unremarkable visualized pelvic bowel loops. Vascular/Lymphatic: Aortic atherosclerosis. No aneurysm. No suspicious lymph nodes. Reproductive: Prostate unremarkable. Scrotal hydroceles. Marked scrotal edema. No evidence for rim enhancing abscess. Negative for soft tissue emphysema Other:  Negative for pelvic effusion or free air Musculoskeletal: No acute osseous abnormality. IMPRESSION: 1. Marked scrotal edema. No evidence for rim enhancing abscess or soft tissue emphysema. Scrotal hydroceles. 2. Aortic atherosclerosis. Aortic Atherosclerosis (ICD10-I70.0). Electronically Signed   By: Esmeralda Hedge M.D.    On: 10/05/2023 19:21   CT ABDOMEN PELVIS W CONTRAST Result Date: 10/04/2023 CLINICAL DATA:  Left lower quadrant abdominal pain. EXAM: CT ABDOMEN AND PELVIS WITH CONTRAST TECHNIQUE: Multidetector CT imaging of the abdomen and pelvis was performed using the standard protocol following bolus administration of intravenous contrast. RADIATION DOSE REDUCTION: This exam was performed according to the departmental dose-optimization program which includes automated exposure control, adjustment of the mA and/or kV according to patient size and/or  use of iterative reconstruction technique. CONTRAST:  75mL OMNIPAQUE  IOHEXOL  350 MG/ML SOLN COMPARISON:  04/28/2022 FINDINGS: Lower chest: No acute abnormality. Hepatobiliary: Stable 1.3 cm right hepatic cyst. Gallbladder is unremarkable. No biliary dilatation. Pancreas: Unremarkable. No pancreatic ductal dilatation or surrounding inflammatory changes. Spleen: Normal in size without focal abnormality. Adrenals/Urinary Tract: Adrenal glands are unremarkable. Kidneys are normal, without renal calculi, suspicious focal lesion, or hydronephrosis. Bladder is unremarkable. Stomach/Bowel: Stomach is within normal limits. Appendix appears normal. No evidence of bowel wall thickening, distention, or inflammatory changes. Moderate volume of stool throughout the colon. Vascular/Lymphatic: Abdominal aorta is normal in caliber with aortoiliac atherosclerotic calcification again noted. No enlarged abdominal or pelvic lymph nodes. Reproductive: Prostate is unremarkable. Other: No abdominal wall hernia or abnormality. No abdominopelvic ascites. No free air. Musculoskeletal: No acute or significant osseous findings. IMPRESSION: 1. No acute localizing findings in the abdomen or pelvis. 2.  Aortic Atherosclerosis (ICD10-I70.0). Electronically Signed   By: Mannie Seek M.D.   On: 10/04/2023 18:16   US  SCROTUM W/DOPPLER Result Date: 10/04/2023 CLINICAL DATA:  Left scrotal pain for 2 months.   Worsening x1 day. EXAM: SCROTAL ULTRASOUND DOPPLER ULTRASOUND OF THE TESTICLES TECHNIQUE: Complete ultrasound examination of the testicles, epididymis, and other scrotal structures was performed. Color and spectral Doppler ultrasound were also utilized to evaluate blood flow to the testicles. COMPARISON:  AP pelvis 05/04/2016 FINDINGS: Right testicle Measurements: 4.9 x 2.0 x 3.2 cm. No mass or microlithiasis visualized. Left testicle Measurements: 4.7 x 2.7 x 3.1 cm. No mass or microlithiasis visualized. Right epididymis:  Normal in size and appearance. Left epididymis:  Normal in size and appearance. Hydrocele:  Mild left and minimal right hydroceles. Varicocele:  None visualized. Pulsed Doppler interrogation of both testes demonstrates normal low resistance arterial and venous waveforms bilaterally. IMPRESSION: 1. Mild left and minimal right hydroceles. 2. Otherwise normal scrotal ultrasound. Electronically Signed   By: Bertina Broccoli M.D.   On: 10/04/2023 16:24    Microbiology: Results for orders placed or performed during the hospital encounter of 10/04/23  Urine Culture     Status: None   Collection Time: 10/04/23  5:11 PM   Specimen: Urine, Clean Catch  Result Value Ref Range Status   Specimen Description URINE, CLEAN CATCH  Final   Special Requests NONE  Final   Culture   Final    NO GROWTH Performed at [facility], [address] [place], [place] [address]    Report Status 10/06/2023 FINAL  Final  Blood culture (routine x 2)     Status: None (Preliminary result)   Collection Time: 10/04/23  5:30 PM   Specimen: BLOOD  Result Value Ref Range Status   Specimen Description BLOOD RIGHT ANTECUBITAL  Final   Special Requests   Final    BOTTLES DRAWN AEROBIC AND ANAEROBIC Blood Culture results may not be optimal due to an inadequate volume of blood received in culture bottles   Culture   Final    NO GROWTH 3 DAYS Performed at [facility], [address], [place], [place]  [address]    Report Status PENDING  Incomplete  Culture, blood (Routine X 2) w Reflex to ID Panel     Status: None (Preliminary result)   Collection Time: 10/05/23  5:03 AM   Specimen: BLOOD  Result Value Ref Range Status   Specimen Description BLOOD LEFT ANTECUBITAL  Final   Special Requests   Final    BOTTLES DRAWN AEROBIC AND ANAEROBIC Blood  Culture results may not be optimal due to an inadequate volume of blood received in culture bottles   Culture   Final    NO GROWTH 2 DAYS Performed at [facility], [address], [place], [place] [address]    Report Status PENDING  Incomplete    Labs: CBC: Recent Labs  Lab 10/04/23 1542 10/05/23 0503 10/06/23 1300 10/07/23 0656 10/07/23 1337  WBC 19.0* 19.8* 11.0* 5.8 6.6  NEUTROABS 17.1*  --   --   --  4.3  HGB 13.5 12.7* 11.8* 12.3* 12.3*  HCT 41.8 37.7* 36.4* 37.7* 39.4  MCV 85.5 83.0 85.0 83.8 88.5  PLT 289 293 244 263 292   Basic Metabolic Panel: Recent Labs  Lab 10/04/23 1542 10/05/23 0503 10/06/23 1300 10/07/23 0656 10/07/23 1337  NA 137 133* 133* 134* 135  K 3.7 3.6 3.9 3.7 4.1  CL 102 101 101 102 100  CO2 22 25 22 25 27   GLUCOSE 144* 87 181* 109* 96  BUN 14 13 9 7 10   CREATININE 0.82 0.93 0.83 0.76 0.86  CALCIUM 9.0 8.4* 8.0* 8.2* 8.4*   Liver Function Tests: Recent Labs  Lab 10/04/23 1542 10/06/23 1300 10/07/23 0656 10/07/23 1337  AST 33 15 13* 16  ALT 25 14 14 17   ALKPHOS 74 55 64 77  BILITOT 1.6* 0.5 0.5 0.5  PROT 6.2* 5.9* 5.7* 6.8  ALBUMIN 3.6 2.8* 2.6* 3.2*   CBG: No results for input(s): "GLUCAP" in the last 168 hours.   Signed: Cherylle Corwin, MD [facility] 10/07/2023

## 2023-10-07 NOTE — ED Provider Notes (Signed)
  EMERGENCY DEPARTMENT AT [facility] Provider Note   CSN: [id number] Arrival date & time: 10/07/23  1201     History Chief Complaint  Patient presents with   Groin Swelling    Chris Randall is a 48 y.o. male with h/o polysubstance use presents to the ER for reevaluation.  Patient was initially admitted for pyelonephritis with some testicular and scrotal changes.  He ended up leaving his medical advice earlier today as he did not feel safe.  He was refusing urology consultation at that time.  He is coming here again for continued pain.  Denies that it is worsened or improved.  Denies any new injury.  Complaining of dysuria without hematuria, urgency, or frequency.  Does still have some suprapubic pressure.  Denies nausea, vomiting, or bowel changes.  HPI     Home Medications Prior to Admission medications   Medication Sig Start Date End Date Taking? Authorizing Provider  buprenorphine -naloxone  (SUBOXONE ) 2-0.5 mg SUBL SL tablet Place 1 tablet under the tongue daily. Patient not taking: Reported on 10/07/2023 09/23/23   Floyce Hutching, MD  cephALEXin  (KEFLEX ) 500 MG capsule Take 1 capsule (500 mg total) by mouth 2 (two) times daily for 5 days. Patient not taking: Reported on 10/07/2023 10/07/23 10/12/23  Oral Billings, MD  hydrocortisone  cream 1 % Apply topically 2 (two) times daily. Patient not taking: Reported on 10/04/2023 09/22/23   Floyce Hutching, MD  QUEtiapine  (SEROQUEL ) 50 MG tablet Take 1 tablet (50 mg total) by mouth at bedtime. Patient not taking: Reported on 10/07/2023 09/22/23   Floyce Hutching, MD      Allergies    Motrin  Clarisse.Clinch ] and Vicodin [hydrocodone -acetaminophen ]    Review of Systems   Review of Systems  Constitutional:  Negative for chills and fever.  Respiratory:  Negative for shortness of breath.   Gastrointestinal:  Positive for abdominal pain. Negative for constipation, diarrhea, nausea and vomiting.   Genitourinary:  Positive for dysuria and testicular pain. Negative for frequency, hematuria, penile discharge, penile pain and urgency.    Physical Exam Updated Vital Signs BP 114/71 (BP Location: Left Arm)   Pulse 89   Temp (!) 97.5 F (36.4 C) (Oral)   Resp 16   SpO2 100%  Physical Exam Vitals and nursing note reviewed. Exam conducted with a chaperone present Cindie Credit, NT).  Constitutional:      General: He is not in acute distress.    Appearance: He is not toxic-appearing.  Eyes:     General: No scleral icterus. Cardiovascular:     Rate and Rhythm: Normal rate.  Pulmonary:     Effort: Pulmonary effort is normal. No respiratory distress.  Abdominal:     Palpations: Abdomen is soft.     Tenderness: There is abdominal tenderness. There is no guarding or rebound.     Comments: Some mild suprapubic tenderness to palpation  Genitourinary:    Penis: Normal and circumcised. No tenderness, discharge, swelling or lesions.      Testes:        Left: Tenderness and swelling present.     Comments: Unable to test for inguinal canal hernias d/t swelling and pain. Scrotum does not appear swollen, but does have some testicular and possible epididymal swelling. No overlying warmth or rash. Does not appear to have any perineal involvement.  Skin:    General: Skin is warm and dry.  Neurological:     Mental Status: He is alert.     ED  Results / Procedures / Treatments   Labs (all labs ordered are listed, but only abnormal results are displayed) Labs Reviewed  CBC WITH DIFFERENTIAL/PLATELET - Abnormal; Notable for the following components:      Result Value   Hemoglobin 12.3 (*)    All other components within normal limits  COMPREHENSIVE METABOLIC PANEL WITH GFR - Abnormal; Notable for the following components:   Calcium 8.4 (*)    Albumin 3.2 (*)    All other components within normal limits  URINALYSIS, ROUTINE W REFLEX MICROSCOPIC    EKG None  Radiology US  SCROTUM  W/DOPPLER Result Date: 10/07/2023 CLINICAL DATA:  Left testicular pain for 5 days. EXAM: SCROTAL ULTRASOUND DOPPLER ULTRASOUND OF THE TESTICLES TECHNIQUE: Complete ultrasound examination of the testicles, epididymis, and other scrotal structures was performed. Color and spectral Doppler ultrasound were also utilized to evaluate blood flow to the testicles. COMPARISON:  Oct 04, 2023. FINDINGS: Right testicle Measurements: 4.7 x 2.5 x 2.5 cm. No mass or microlithiasis visualized. Left testicle Measurements: 4.1 x 2.7 x 2.7 cm. No mass or microlithiasis visualized. Right epididymis:  Normal in size and appearance. Left epididymis:  Normal in size and appearance. Hydrocele: Small right hydrocele is again noted. There is now noted moderate size complex left hydrocele concerning for infection. Varicocele:  None visualized. Pulsed Doppler interrogation of both testes demonstrates normal low resistance arterial and venous waveforms bilaterally. IMPRESSION: No evidence of testicular mass or torsion. There is now noted moderate size complex left hydrocele with multiple septations concerning for infection. Electronically Signed   By: Rosalene Colon M.D.   On: 10/07/2023 14:37   ECHOCARDIOGRAM COMPLETE Result Date: 10/06/2023    ECHOCARDIOGRAM REPORT   Patient Name:   Chris Randall Date of Exam: 10/06/2023 Medical Rec #:  [id number]          Height:       64.0 in Accession #:    [id number]         Weight:       160.1 lb Date of Birth:  06-04-1975           BSA:          1.780 m Patient Age:    47 years           BP:           106/70 mmHg Patient Gender: M                  HR:           75 bpm. Exam Location:  Inpatient Procedure: 2D Echo, Color Doppler and Cardiac Doppler (Both Spectral and Color            Flow Doppler were utilized during procedure). Indications:    Bacteremia  History:        Patient has no prior history of Echocardiogram examinations.                 Risk Factors:Polysubstance Abuse.  Sonographer:     Sherline Distel Senior RDCS Referring Phys: [id number] Devaun K CHIU IMPRESSIONS  1. Left ventricular ejection fraction, by estimation, is 60 to 65%. The left ventricle has normal function. The left ventricle has no regional wall motion abnormalities. Left ventricular diastolic parameters were normal.  2. Right ventricular systolic function is normal. The right ventricular size is normal. Tricuspid regurgitation signal is inadequate for assessing PA pressure.  3. The mitral valve is normal in structure. No evidence of mitral valve regurgitation.  No evidence of mitral stenosis.  4. The aortic valve is tricuspid. Aortic valve regurgitation is not visualized. Aortic valve sclerosis/calcification is present, without any evidence of aortic stenosis.  5. The inferior vena cava is dilated in size with <50% respiratory variability, suggesting right atrial pressure of 15 mmHg. Conclusion(s)/Recommendation(s): No evidence of valvular vegetations on this transthoracic echocardiogram. Consider a transesophageal echocardiogram to exclude infective endocarditis if clinically indicated. FINDINGS  Left Ventricle: Left ventricular ejection fraction, by estimation, is 60 to 65%. The left ventricle has normal function. The left ventricle has no regional wall motion abnormalities. The left ventricular internal cavity size was normal in size. There is  no left ventricular hypertrophy. Left ventricular diastolic parameters were normal. Normal left ventricular filling pressure. Right Ventricle: The right ventricular size is normal. No increase in right ventricular wall thickness. Right ventricular systolic function is normal. Tricuspid regurgitation signal is inadequate for assessing PA pressure. Left Atrium: Left atrial size was normal in size. Right Atrium: Right atrial size was normal in size. Pericardium: There is no evidence of pericardial effusion. Mitral Valve: The mitral valve is normal in structure. No evidence of mitral valve regurgitation. No  evidence of mitral valve stenosis. Tricuspid Valve: The tricuspid valve is normal in structure. Tricuspid valve regurgitation is trivial. No evidence of tricuspid stenosis. Aortic Valve: The aortic valve is tricuspid. Aortic valve regurgitation is not visualized. Aortic valve sclerosis/calcification is present, without any evidence of aortic stenosis. Pulmonic Valve: The pulmonic valve was normal in structure. Pulmonic valve regurgitation is not visualized. No evidence of pulmonic stenosis. Aorta: The aortic root is normal in size and structure. Venous: The inferior vena cava is dilated in size with less than 50% respiratory variability, suggesting right atrial pressure of 15 mmHg. IAS/Shunts: No atrial level shunt detected by color flow Doppler.  LEFT VENTRICLE PLAX 2D LVIDd:         5.40 cm   Diastology LVIDs:         3.70 cm   LV e' medial:    11.65 cm/s LV PW:         0.80 cm   LV E/e' medial:  7.0 LV IVS:        0.80 cm   LV e' lateral:   15.20 cm/s LVOT diam:     2.40 cm   LV E/e' lateral: 5.4 LV SV:         77 LV SV Index:   43 LVOT Area:     4.52 cm  RIGHT VENTRICLE RV S prime:     13.10 cm/s TAPSE (M-mode): 2.7 cm LEFT ATRIUM             Index        RIGHT ATRIUM           Index LA diam:        2.30 cm 1.29 cm/m   RA Area:     16.00 cm LA Vol (A2C):   52.2 ml 29.33 ml/m  RA Volume:   40.70 ml  22.87 ml/m LA Vol (A4C):   57.0 ml 32.03 ml/m LA Biplane Vol: 57.3 ml 32.20 ml/m  AORTIC VALVE LVOT Vmax:   88.90 cm/s LVOT Vmean:  65.600 cm/s LVOT VTI:    0.170 m  AORTA Ao Root diam: 3.60 cm MITRAL VALVE MV Area (PHT): 2.59 cm    SHUNTS MV Decel Time: 293 msec    Systemic VTI:  0.17 m MV E velocity: 81.70 cm/s  Systemic Diam: 2.40 cm  MV A velocity: 56.00 cm/s MV E/A ratio:  1.46 Gaylyn Keas MD Electronically signed by Gaylyn Keas MD Signature Date/Time: 10/06/2023/4:58:47 PM    Final    CT PELVIS W CONTRAST Result Date: 10/05/2023 CLINICAL DATA:  Scrotal cellulitis/swelling chronic testicle pain EXAM:  CT PELVIS WITH CONTRAST TECHNIQUE: Multidetector CT imaging of the pelvis was performed using the standard protocol following the bolus administration of intravenous contrast. RADIATION DOSE REDUCTION: This exam was performed according to the departmental dose-optimization program which includes automated exposure control, adjustment of the mA and/or kV according to patient size and/or use of iterative reconstruction technique. CONTRAST:  75mL OMNIPAQUE  IOHEXOL  350 MG/ML SOLN COMPARISON:  CT 10/04/2023, scrotal ultrasound 10/04/2023 FINDINGS: Urinary Tract:  No abnormality visualized. Bowel:  Unremarkable visualized pelvic bowel loops. Vascular/Lymphatic: Aortic atherosclerosis. No aneurysm. No suspicious lymph nodes. Reproductive: Prostate unremarkable. Scrotal hydroceles. Marked scrotal edema. No evidence for rim enhancing abscess. Negative for soft tissue emphysema Other:  Negative for pelvic effusion or free air Musculoskeletal: No acute osseous abnormality. IMPRESSION: 1. Marked scrotal edema. No evidence for rim enhancing abscess or soft tissue emphysema. Scrotal hydroceles. 2. Aortic atherosclerosis. Aortic Atherosclerosis (ICD10-I70.0). Electronically Signed   By: Esmeralda Hedge M.D.   On: 10/05/2023 19:21    Procedures Procedures   Medications Ordered in ED Medications  cefTRIAXone  (ROCEPHIN ) 1 g in sodium chloride  0.9 % 100 mL IVPB (has no administration in time range)  morphine  (PF) 4 MG/ML injection 4 mg (4 mg Intravenous Given 10/07/23 1401)    ED Course/ Medical Decision Making/ A&P Clinical Course as of 10/07/23 1500  Fri Oct 07, 2023  1449 Urology NP has assessed the patient at bedside. Does not think CT needs to be done, but recommends IV antibiotics and readmission.  [RR]    Clinical Course User Index [RR] Spence Dux, PA-C   Medical Decision Making Amount and/or Complexity of Data Reviewed Labs: ordered. Radiology: ordered.  Risk Prescription drug management. Decision  regarding hospitalization.   48 y.o. male presents to the ER for evaluation of testicular pain for the past 3 days. Differential diagnosis includes but is not limited to testicular torsion, epididymitis, malignancy, infection, Fournier's gangrene. Vital signs temperature 97.5 otherwise unremarkable. Physical exam as noted above.   On previous chart evaluation, patient was admitted for possible psych and pyelonephritis.  He did not leave AGAINST MEDICAL ADVICE due to not getting pain medication.  He refused urology consultation because of this.  He reports that he felt "scared for his life" which is why he came to a different hospital. He reports he left [facility] and came straight here. He reports that the pain is the same and has not improved or worsened. Denies any injury. There does appear to be testicular/epididymal swelling. Does not appear to be any perineum involvement.  Will continue with ultrasound and recheck labs and urine.  He does not appear patient's coronary chlamydia has resulted yet.  Urine culture is also not resulted yet from previous hospital stay.  I independently reviewed and interpreted the patient's labs.  CBC without leukocytosis which is improved and patient's initial white blood count 19 on admission.  Hemoglobin stable at 12.3.  CMP shows mildly decreased calcium and albumin otherwise no other electrolyte or LFT abnormality.  Urinalysis is unremarkable.  US  shows  1. Marked scrotal edema. No evidence for rim enhancing abscess or soft tissue emphysema. Scrotal hydroceles. 2. Aortic atherosclerosis. Per radiologist's interpretation.  Per radiologist's interpretation.    I consulted urology and  spoke with Cam Sattenfield, NP. He assessed the patient at bedside. He thinks this is likely a complex hydrocele, recommends continuing antibiotics, does not think this is Fournier's gangrene and does not recommend CT imaging at this time.  Does recommend continuing admission.  Patient is  amenable. Admit to Dr. Francesco Inks.   Portions of this report may have been transcribed using voice recognition software. Every effort was made to ensure accuracy; however, inadvertent computerized transcription errors may be present.    Final Clinical Impression(s) / ED Diagnoses Final diagnoses:  Hydrocele in adult    Rx / DC Orders ED Discharge Orders     None         Spence Dux, PA-C 10/07/23 1638    Mozell Arias, MD 10/10/23 [phone]

## 2023-10-08 DIAGNOSIS — F1120 Opioid dependence, uncomplicated: Secondary | ICD-10-CM

## 2023-10-08 DIAGNOSIS — N431 Infected hydrocele: Secondary | ICD-10-CM | POA: Diagnosis not present

## 2023-10-08 LAB — BASIC METABOLIC PANEL WITH GFR
Anion gap: 10 (ref 5–15)
BUN: 10 mg/dL (ref 6–20)
CO2: 27 mmol/L (ref 22–32)
Calcium: 8.9 mg/dL (ref 8.9–10.3)
Chloride: 98 mmol/L (ref 98–111)
Creatinine, Ser: 0.67 mg/dL (ref 0.61–1.24)
GFR, Estimated: >60 mL/min (ref >60)
Glucose, Bld: 94 mg/dL (ref 70–99)
Potassium: 4.1 mmol/L (ref 3.5–5.1)
Sodium: 135 mmol/L (ref 135–145)

## 2023-10-08 LAB — CBC
HCT: 42.5 % (ref 39.0–52.0)
Hemoglobin: 13.4 g/dL (ref 13.0–17.0)
MCH: 27.2 pg (ref 26.0–34.0)
MCHC: 31.5 g/dL (ref 30.0–36.0)
MCV: 86.4 fL (ref 80.0–100.0)
Platelets: 331 10*3/uL (ref 150–400)
RBC: 4.92 MIL/uL (ref 4.22–5.81)
RDW: 13 % (ref 11.5–15.5)
WBC: 5.4 10*3/uL (ref 4.0–10.5)
nRBC: 0 % (ref 0.0–0.2)

## 2023-10-08 MED ORDER — DIPHENHYDRAMINE-ZINC ACETATE 2-0.1 % EX CREA
TOPICAL_CREAM | Freq: Three times a day (TID) | CUTANEOUS | Status: DC | PRN
  Filled 2023-10-08 (×2): qty 28

## 2023-10-08 MED ORDER — QUETIAPINE FUMARATE 25 MG PO TABS
50.0000 mg | ORAL_TABLET | Freq: Every day | ORAL | Status: DC
  Administered 2023-10-08 – 2023-10-17 (×10): 50 mg via ORAL
  Filled 2023-10-08 (×10): qty 2

## 2023-10-08 MED ORDER — MORPHINE SULFATE (PF) 2 MG/ML IV SOLN
1.0000 mg | INTRAVENOUS | Status: DC | PRN
  Administered 2023-10-08 – 2023-10-13 (×21): 2 mg via INTRAVENOUS
  Filled 2023-10-08 (×21): qty 1

## 2023-10-08 NOTE — Plan of Care (Signed)
  Problem: Education: Goal: Knowledge of General Education information will improve Description: Including pain rating scale, medication(s)/side effects and non-pharmacologic comfort measures Outcome: Progressing   Problem: Health Behavior/Discharge Planning: Goal: Ability to manage health-related needs will improve Outcome: Progressing   Problem: Clinical Measurements: Goal: Ability to maintain clinical measurements within normal limits will improve Outcome: Progressing Goal: Will remain free from infection Outcome: Not Progressing Goal: Diagnostic test results will improve Outcome: Progressing Goal: Respiratory complications will improve Outcome: Progressing   Problem: Activity: Goal: Risk for activity intolerance will decrease Outcome: Progressing   Problem: Coping: Goal: Level of anxiety will decrease Outcome: Progressing   Problem: Elimination: Goal: Will not experience complications related to bowel motility Outcome: Progressing

## 2023-10-08 NOTE — Plan of Care (Signed)

## 2023-10-08 NOTE — Consult Note (Signed)
 [facility] Psychiatric Consult Initial  Patient Name: .Chris Randall  MRN: [id number]  DOB: 12/24/1975  Consult Order details:  Orders (From admission, onward)     Start     Ordered   10/04/23 2301  IP CONSULT TO PSYCHIATRY       Ordering Provider: Kenny Peals, MD  Provider:  (Not yet assigned)  Question Answer Comment  Location [facility]   Reason for Consult? Hx of substance induced mood disorder experiencing paranoia and hallucinations.  Admitted for UTI.      10/04/23 2301             Mode of Visit: In person    Psychiatry Consult Evaluation  Service Date: Oct 08, 2023 LOS:  LOS: 1 day  Chief Complaint   Primary Psychiatric Diagnoses  MDD 2.  Polysubstance abuse    Assessment  Chris Randall is a 48 y.o. male admitted: Medicallyfor 10/07/2023 12:09 PM for testicular pain. He carries the psychiatric diagnoses of  and has a past medical history of tobacco use, GERD, UTI, asthma, erosive esophageal varices, hep c.   Patient's current presentation of poor sleep, decreased energy, decreased appetite and occasional low mood is most consistent with a diagnosis of adjustment disorder (all symptoms seem to be relatively new and tied to worsening in symptoms of grief).  Some of his impulsivity (makes a couple of clearly inappropriate jokes during interview ).  His current presentation of persistent and ongoing meth use despite medical and psychosocial consequences (numerous hospitalizations, separation from gf,  familial disruption), cravings to use, desire to cut down use with previous unsuccessful attempts, significant amounts of time spent using meth, impaired control over use, cravings, and evidence of tolerance are consistent with amphetamine use disorder, severe. Additionally, while meth use is likely contributing to symptoms of depression, it is felt that patient also meets criteria for comorbid major depressive disorder given severity and  persistence of symptoms.  Patient also endorses history of trauma with associated recurrent intrusive memories to past traumatic events and flashbacks, nightmares, hypervigilance, and avoidance behaviors consistent with posttraumatic stress disorder.  Although patient carries historical diagnosis of bipolar disorder, there is low suspicion for bipolar spectrum illness at this time as patient denies history of mania or hypomania outside of active periods of substance use.  Patient endorses inconsistent adherence with medications impacted by substance use.  Will attempt to consolidate medications as below and prioritize medications for depression and PTSD.  He expresses strong desire for inpatient rehab and shows insight into importance of going directly from hospital to rehab; agree with this goal if possible to minimize risk of return to use.  Although patient has history of suicidal ideation, he denies currently and it is not felt that she meets criteria for involuntary commitment at this time however will continue to assess safety and most appropriate disposition.   Today, patient reports reasonable level of anxiety related to next steps once he leaves the hospital but remains committed in his desire to go to inpatient rehab for meth use disorder after medical hospitalization. No acute safety concerns on interview. He is not presenting with any acute mania at this time.  Otherwise, no changes to plan of care at this time.     10/08/2023: Patient seen face to face in his hospital room. He is awake, alert, and oriented x 4. Patient is calm and cooperative, watching TV when writer enters his room. When asked why he left [place], patient  states he could not stay because the friend of his ex girlfriend works there and was making him feel paranoid and uncomfortable due to what he had experienced in the past. However, patient denies delusions, hallucinations, depressive symptoms, anxiety, sleep issues or appetite  problem. He denies suicidal or homicidal ideation, intent or plan. Patient states he does not need any psychiatric treatment at this time but would like to be discharged to "[facility]" recovery services as soon as his pain subsides.   Diagnoses:  Active Hospital problems: Principal Problem:   Infected hydrocele Active Problems:   Tobacco abuse   Substance induced mood disorder (HCC)   GERD/Barrett's esophagus/varices   Polysubstance abuse (HCC)    Plan   ## Psychiatric Medication Recommendations:  -Continue with current recommendations per primary team including pain management -Consider discharge to [facility] recovery services as requested by the patient.    ## Medical Decision Making Capacity: Not specifically addressed in this encounter  ## Further Work-up:  -- None TSH, B12, folate, EKG, While pt on Qtc prolonging medications, please monitor & replete K+ to 4 and Mg2+ to 2, TOC consult for substance abuse resources, U/A, or UDS -- most recent EKG on  09/19/23 had QtC of 417 -- Pertinent labwork reviewed earlier this admission includes: positive urinalysis for UTI (moderate leuks and nitrites, UDS positive for opiates, amphetamines, and cocaine.  CBC abnormal as a result of white blood cell count (19.8).    ## Disposition:-- There are no psychiatric contraindications to discharge at this time preferred door-to-door transfer however if patient is medically cleared prior to bed offer an inpatient rehab; consider transfer to facility based crisis center at [facility] behavioral health.   ## Behavioral / Environmental: - No specific recommendations at this time.     ## Safety and Observation Level:  - Based on my clinical evaluation, I estimate the patient to be at low risk of self harm in the current setting. - At this time, we recommend  routine. This decision is based on my review of the chart including patient's history and current presentation, interview of the patient,  mental status examination, and consideration of suicide risk including evaluating suicidal ideation, plan, intent, suicidal or self-harm behaviors, risk factors, and protective factors. This judgment is based on our ability to directly address suicide risk, implement suicide prevention strategies, and develop a safety plan while the patient is in the clinical setting. Please contact our team if there is a concern that risk level has changed.  CSSR Risk Category:C-SSRS RISK CATEGORY: No Risk   Patients current symptoms do not reflect the need for ongoing inpatient psychiatric services or inpatient psychiatric hospitalization. While future psychiatric events cannot be accurately predicted, the patient does not currently require acute inpatient psychiatric care and does not currently meet [place]  involuntary commitment criteria.  Suicide Risk Assessment: Patient has following modifiable risk factors for suicide: untreated depression and medication noncompliance, which we are addressing by inpatient rehab, assistance with outpatient resources and referrals. Patient has following non-modifiable or demographic risk factors for suicide: male gender, separation or divorce, history of suicide attempt, history of self harm behavior, and psychiatric hospitalization Patient has the following protective factors against suicide: Access to outpatient mental health care, Supportive friends, Cultural, spiritual, or religious beliefs that discourage suicide, and Frustration tolerance  Thank you for this consult request. Recommendations have been communicated to the primary team.  We will sign off at this time.   Jameriah Trotti A Gardy Montanari, MD  History of Present Illness  Relevant Aspects of [facility] Course:  Admitted on 10/07/2023 for testicular pain and urinary tract infection.   Patient Report:  Patient is alert and oriented, cooperative.  He is noted to be very tearful upon entering the room, reports  ports recently discovering his childhood friend passed away from a heart attack.  He tells me he continues to struggle with substances and recently was at [facility] for detox, however relapsed immediately upon discharge.  He reports he has been using crystal meth a little over 1 year, amount is unknown he describes it as "a little line or pinched of" in terms of Suboxone  he reports using 8 mg pad " broken into 1 /4 over about 10 years."  He denies currently feeling suicidal, reports endorsing suicidal ideations x 2 weeks ago.  He describes his mood as sad and hopeless.  He reports current stresses include recent separation "moving on" from girlfriend, friends dying, unemployed.  He reports his last episode of self-harm was this past winter in 2025.  He reports cutting made his heart ache pain go away.  When assessing for past psychiatric history he endorses numerous inpatient psychiatric hospitalizations.  Currently not receiving any outpatient services.  Patient reports he is currently taking Seroquel  50 mg p.o. nightly, however would like to have this reduced to 25.  Patient also inquires about starting Adderall, told patient to discuss with inpatient rehab feel restarting prescribed medication it was previously abuse is not a good habit.  Psych ROS:  Depression: Sadness, hopelessness, tearfulness, insomnia Anxiety: Nervousness, excessive worrying, heart palpitations Mania (lifetime and current): Endorses however he is usually under the influence of the substance Psychosis: (lifetime and current): Denies currently reports history of hallucinations when under the influence of a substance  Collateral information:  N/a  Review of Systems  Psychiatric/Behavioral:  Positive for substance abuse. Negative for depression, hallucinations and suicidal ideas. The patient is not nervous/anxious and does not have insomnia.   All other systems reviewed and are negative.    Psychiatric and  Social History  Psychiatric History:  Information collected from Patient and chart review.   Prev Dx/Sx: Adhd, polysusbtance abuse, MDD Current Psych Provider: Denies Home Meds (current): Suboxone  Previous Med Trials: Suboxone  Therapy: [facility]  Prior Psych Hospitalization: Numerous too many to count  Prior Self Harm: Yes cutting Prior Violence: Yes  Family Psych History: Per patient " extensive mental illness" Family Hx suicide: MGGM completed by hanging per patient  Social History:  Developmental Hx: WNL Educational Hx: 8th grade Occupational Hx: Unemployed Legal Hx: Denies Living Situation: Homeless Spiritual Hx: Christian Access to weapons/lethal means: Denies   Substance History Alcohol: Denies  Illicit drugs: Crystal meth.  Prescription drug abuse: Suboxone  Rehab hx: Denies  Exam Findings  Physical Exam: Sitting on bed very tearful reports finding out a friend passed away by heart attack.  Vital Signs:  Temp:  [98 F (36.7 C)-98.8 F (37.1 C)] 98 F (36.7 C) (05/17 1222) Pulse Rate:  [61-72] 62 (05/17 1222) Resp:  [18-20] 20 (05/17 1222) BP: (98-106)/(55-68) 98/55 (05/17 1222) SpO2:  [97 %-98 %] 98 % (05/17 1222) Blood pressure (!) 98/55, pulse 62, temperature 98 F (36.7 C), temperature source Oral, resp. rate 20, height 5\' 4"  (1.626 m), weight 72.6 kg, SpO2 98%. Body mass index is 27.47 kg/m.  Physical Exam Vitals and nursing note reviewed.  Constitutional:      Appearance: Normal appearance. He is normal weight.  Neurological:  General: No focal deficit present.     Mental Status: He is alert and oriented to person, place, and time. Mental status is at baseline.  Psychiatric:        Attention and Perception: Attention and perception normal.        Mood and Affect: Mood is anxious and depressed. Affect is tearful.        Speech: Speech is tangential.        Behavior: Behavior is hyperactive. Behavior is cooperative.        Thought Content: Thought  content normal.        Cognition and Memory: Cognition and memory normal.        Judgment: Judgment normal.     Mental Status Exam: General Appearance: Fairly Groomed  Orientation:  Full (Time, Place, and Person)  Memory:  Immediate;   Fair Recent;   Good  Concentration:  Concentration: Good and Attention Span: Good  Recall:  Fair  Attention  Fair  Eye Contact:  Fair  Speech:  Clear and Coherent and Normal Rate  Language:  Good  Volume:  Normal  Mood: " I feel good"  Affect:  Appropriate, Restricted, and Tearful  Thought Process:  Coherent, Linear, and Descriptions of Associations: Circumstantial  Thought Content:  Logical and Tangential  Suicidal Thoughts:  No  Homicidal Thoughts:  No  Judgement:  Intact  Insight:  Present  Psychomotor Activity:  Normal  Akathisia:  No  Fund of Knowledge:  Good      Assets:  Communication Skills Desire for Improvement Financial Resources/Insurance Leisure Time Physical Health Resilience Social Support  Cognition:  WNL  ADL's:  Intact  AIMS (if indicated):        Other History   These have been pulled in through the EMR, reviewed, and updated if appropriate.  Family History:  The patient's family history includes Cancer in an other family member; Heart failure in his mother; Hypertension in an other family member.  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    Bronchitis    Esophageal varices (HCC) 2003 vs 2008   pt reprted varices on EGD in [place] "10 to 15 years ago. No varices or portal gastropathy on EGD 08/02/16   Hematemesis 08/02/2016   Hx GERD, findings on 08/02/16 EGD: esophagitis LA grade B, ?underlying Barrett's esphagus; 4 cm HH; hemorrhagic gastritis and red blood in stomach (source or bleeding); NO VARICES OR PORTALGASTROPATHY.    Hepatitis C    Substance or medication-induced bipolar and related disorder with onset  during intoxication (HCC) 12/29/2016   Opioid, cocaine, alcohol    Surgical History: Past Surgical History:  Procedure Laterality Date   ESOPHAGOGASTRODUODENOSCOPY (EGD) WITH PROPOFOL  N/A 08/02/2016   Procedure: ESOPHAGOGASTRODUODENOSCOPY (EGD) WITH PROPOFOL ;  Surgeon: Kenney Peacemaker, MD;  Location: [facility];  Service: Endoscopy;  Laterality: N/A;   ROTATOR CUFF REPAIR  1995   SHOULDER SURGERY       Medications:   Current Facility-Administered Medications:    ceFEPIme  (MAXIPIME ) 2 g in sodium chloride  0.9 % 100 mL IVPB, 2 g, Intravenous, Q8H, Davis, [place], RPH, Last Rate: 200 mL/hr at 10/08/23 1346, 2 g at 10/08/23 1346   diphenhydrAMINE -zinc acetate (BENADRYL ) 2-0.1 % cream, , Topical, TID PRN, Haydee Lipa, MD, Given at 10/08/23 1721   docusate sodium  (COLACE) capsule 100 mg, 100 mg, Oral, BID, Raymona Caldwell, MD, 100 mg at  10/08/23 0833   morphine  (PF) 2 MG/ML injection 1-2 mg, 1-2 mg, Intravenous, Q4H PRN, Haydee Lipa, MD, 2 mg at 10/08/23 1450   nicotine  (NICODERM CQ  - dosed in mg/24 hours) patch 21 mg, 21 mg, Transdermal, Daily, Raymona Caldwell, MD, 21 mg at 10/08/23 1610   ondansetron  (ZOFRAN ) tablet 4 mg, 4 mg, Oral, Q6H PRN, 4 mg at 10/08/23 1725 **OR** ondansetron  (ZOFRAN ) injection 4 mg, 4 mg, Intravenous, Q6H PRN, Raymona Caldwell, MD   oxyCODONE  (Oxy IR/ROXICODONE ) immediate release tablet 5 mg, 5 mg, Oral, Q4H PRN, Raymona Caldwell, MD, 5 mg at 10/08/23 1721   pantoprazole  (PROTONIX ) EC tablet 40 mg, 40 mg, Oral, Daily, Raymona Caldwell, MD, 40 mg at 10/08/23 9604   QUEtiapine  (SEROQUEL ) tablet 50 mg, 50 mg, Oral, QHS, Raymona Caldwell, MD   traMADol  (ULTRAM ) tablet 50 mg, 50 mg, Oral, Q8H PRN, Raymona Caldwell, MD, 50 mg at 10/08/23 0730   [COMPLETED] vancomycin  (VANCOREADY) IVPB 1500 mg/300 mL, 1,500 mg, Intravenous, Once, Last Rate: 150 mL/hr at 10/07/23 1811, 1,500 mg at 10/07/23 1811 **FOLLOWED BY** vancomycin  (VANCOCIN ) IVPB 1000 mg/200 mL premix, 1,000 mg,  Intravenous, Q12H, Roselee Cong, [facility], Last Rate: 200 mL/hr at 10/08/23 1638, 1,000 mg at 10/08/23 1638  Allergies: Allergies  Allergen Reactions   Motrin  [Ibuprofen ] Other (See Comments)    Hx of esophageal ulcers    Vicodin [Hydrocodone -Acetaminophen ] Other (See Comments)    Irritates esophageal ulcers     Chris Marinello Nanine Babcock, MD

## 2023-10-08 NOTE — Progress Notes (Signed)
 PROGRESS NOTE    Chris Randall  Chris Randall DOB: June 17, 1975 DOA: 10/07/2023 PCP: Patient, No Pcp Per   Brief Narrative:  Chris Randall is a 48 y.o. male with medical history significant of polysubstance use (EtOH, opioids, methamphetamine, cocaine, tobacco) with substance-induced mood disorder, GERD/Barrett's esophagus, homelessness who presented to ED with left testicular swelling. He was admitted on 5/13 for same complaints to [facility] for UTI/pyelonephritis and scrotal cellulitis and substance induced mood disorder with psychosis/hallucinations and paranoia. He left AMA 5/16 at 9:45AM, refused to be seen by urology and wanted pain medication. He has now returned.  Assessment & Plan:   Principal Problem:   Infected hydrocele Active Problems:   Polysubstance abuse (HCC)   Substance induced mood disorder (HCC)   GERD/Barrett's esophagus/varices   Tobacco abuse  Infected hydrocele Questionable UTI/Cellulitis 48 year old presenting to ED with worsening pain, swelling of his left scrotum and imaging finding of infected/reactive hydrocele -Continue antibiotics to vanc/cefepime  with drug hx/homelessness -GC/Chlamydia, RPR and HIV negative -Follow previous culture -Urology recommending 10 days of antibiotics and to transition to oral once culture results and sensitivities are finalized. No findings or concerns for Fournier's.  -pain control with oral/IV for now  -elevate scrotum, recommended jock strap for when he leaves  -schedule colace    Polysubstance abuse (HCC) UDS positive for cocaine, amphetamines and opiates on 10/05/23  He is wanting to go to rehab to get off meth after discharge; TOC following Hep C ab positive Nicotine  patch as requested   Substance induced mood disorder (HCC) Rule out acute psychiatric event/paranoia -left AMA 5/16 am - he felt his life was in danger  -psychiatry consulted  -continue seroquel  50mg  nightly    Topical dermatitis -Likely secondary  to positioning - benadryl  cream TID as appropriate   GERD/Barrett's esophagus/varices Start PPI   DVT prophylaxis: SCDs Start: 10/07/23 1721 Place TED hose Start: 10/07/23 1721   Code Status:   Code Status: Full Code  Family Communication: None present  Status is: Inpt  Dispo: The patient is from: Home              Anticipated d/c is to: TBD              Anticipated d/c date is: TBD              Patient currently NOT medically stable for discharge  Consultants:  Urology, Pysch  Procedures:  None  Antimicrobials:  Vancomycin , cefepime  x10 days   Subjective: No acute issues/events overnight - notes new rash over L buttock/arm now improving  Objective: Vitals:   10/07/23 1718 10/07/23 2014 10/08/23 0037 10/08/23 0442  BP: 108/65 106/66 103/68 100/64  Pulse: 67 72 64 61  Resp: 18 18 18 18   Temp: 98.2 F (36.8 C) 98.8 F (37.1 C) 98.5 F (36.9 C) 98 F (36.7 C)  TempSrc: Oral Oral Oral Oral  SpO2:  97% 97% 98%  Weight: 72.6 kg     Height: 5\' 4"  (1.626 m)       Intake/Output Summary (Last 24 hours) at 10/08/2023 0754 Last data filed at 10/08/2023 0355 Gross per 24 hour  Intake 100 ml  Output --  Net 100 ml   Filed Weights   10/07/23 1718  Weight: 72.6 kg    Examination:  General exam: Appears calm and comfortable  Respiratory system: Clear to auscultation. Respiratory effort normal. Cardiovascular system: S1 & S2 heard, RRR. No JVD, murmurs, rubs, gallops or clicks. No pedal edema.  Gastrointestinal system: Abdomen is nondistended, soft and nontender. No organomegaly or masses felt. Normal bowel sounds heard. Central nervous system: Alert and oriented. No focal neurological deficits. Extremities: Symmetric 5 x 5 power. Skin: Noted blanching erythema of L superior buttock and L forearm consistent with topical dermatitis  Data Reviewed: I have personally reviewed following labs and imaging studies  CBC: Recent Labs  Lab 10/04/23 1542 10/05/23 0503  10/06/23 1300 10/07/23 0656 10/07/23 1337 10/08/23 0713  WBC 19.0* 19.8* 11.0* 5.8 6.6 5.4  NEUTROABS 17.1*  --   --   --  4.3  --   HGB 13.5 12.7* 11.8* 12.3* 12.3* 13.4  HCT 41.8 37.7* 36.4* 37.7* 39.4 42.5  MCV 85.5 83.0 85.0 83.8 88.5 86.4  PLT 289 293 244 263 292 331   Basic Metabolic Panel: Recent Labs  Lab 10/04/23 1542 10/05/23 0503 10/06/23 1300 10/07/23 0656 10/07/23 1337  NA 137 133* 133* 134* 135  K 3.7 3.6 3.9 3.7 4.1  CL 102 101 101 102 100  CO2 22 25 22 25 27   GLUCOSE 144* 87 181* 109* 96  BUN 14 13 9 7 10   CREATININE 0.82 0.93 0.83 0.76 0.86  CALCIUM 9.0 8.4* 8.0* 8.2* 8.4*   GFR: Estimated Creatinine Clearance: 98.1 mL/min (by C-G formula based on SCr of 0.86 mg/dL). Liver Function Tests: Recent Labs  Lab 10/04/23 1542 10/06/23 1300 10/07/23 0656 10/07/23 1337  AST 33 15 13* 16  ALT 25 14 14 17   ALKPHOS 74 55 64 77  BILITOT 1.6* 0.5 0.5 0.5  PROT 6.2* 5.9* 5.7* 6.8  ALBUMIN 3.6 2.8* 2.6* 3.2*   Recent Labs  Lab 10/04/23 1730  LIPASE 22   Anemia Panel: Recent Labs    10/07/23 1632  VITAMINB12 238   Sepsis Labs: Recent Labs  Lab 10/04/23 1730  LATICACIDVEN 1.0    Recent Results (from the past 240 hours)  Urine Culture     Status: None   Collection Time: 10/04/23  5:11 PM   Specimen: Urine, Clean Catch  Result Value Ref Range Status   Specimen Description URINE, CLEAN CATCH  Final   Special Requests NONE  Final   Culture   Final    NO GROWTH Performed at [facility], [address] [place], [place] [address]    Report Status 10/06/2023 FINAL  Final  Blood culture (routine x 2)     Status: None (Preliminary result)   Collection Time: 10/04/23  5:30 PM   Specimen: BLOOD  Result Value Ref Range Status   Specimen Description BLOOD RIGHT ANTECUBITAL  Final   Special Requests   Final    BOTTLES DRAWN AEROBIC AND ANAEROBIC Blood Culture results may not be optimal due to an inadequate volume of blood received in culture  bottles   Culture   Final    NO GROWTH 4 DAYS Performed at [facility], [address], [place], [place] [address]    Report Status PENDING  Incomplete  Culture, blood (Routine X 2) w Reflex to ID Panel     Status: None (Preliminary result)   Collection Time: 10/05/23  5:03 AM   Specimen: BLOOD  Result Value Ref Range Status   Specimen Description BLOOD LEFT ANTECUBITAL  Final   Special Requests   Final    BOTTLES DRAWN AEROBIC AND ANAEROBIC Blood Culture results may not be optimal due to an inadequate volume of blood received in culture bottles   Culture   Final  NO GROWTH 3 DAYS Performed at [facility], [address] [place], [place] [address]    Report Status PENDING  Incomplete    Radiology Studies: US  SCROTUM W/DOPPLER Result Date: 10/07/2023 CLINICAL DATA:  Left testicular pain for 5 days. EXAM: SCROTAL ULTRASOUND DOPPLER ULTRASOUND OF THE TESTICLES TECHNIQUE: Complete ultrasound examination of the testicles, epididymis, and other scrotal structures was performed. Color and spectral Doppler ultrasound were also utilized to evaluate blood flow to the testicles. COMPARISON:  Oct 04, 2023. FINDINGS: Right testicle Measurements: 4.7 x 2.5 x 2.5 cm. No mass or microlithiasis visualized. Left testicle Measurements: 4.1 x 2.7 x 2.7 cm. No mass or microlithiasis visualized. Right epididymis:  Normal in size and appearance. Left epididymis:  Normal in size and appearance. Hydrocele: Small right hydrocele is again noted. There is now noted moderate size complex left hydrocele concerning for infection. Varicocele:  None visualized. Pulsed Doppler interrogation of both testes demonstrates normal low resistance arterial and venous waveforms bilaterally. IMPRESSION: No evidence of testicular mass or torsion. There is now noted moderate size complex left hydrocele with multiple septations concerning for infection. Electronically Signed   By: Rosalene Colon M.D.   On: 10/07/2023 14:37    ECHOCARDIOGRAM COMPLETE Result Date: 10/06/2023    ECHOCARDIOGRAM REPORT   Patient Name:   Chris Randall Date of Exam: 10/06/2023 Medical Rec #:  [id number]          Height:       64.0 in Accession #:    [id number]         Weight:       160.1 lb Date of Birth:  [birth year]           BSA:          1.780 m Patient Age:    47 years           BP:           106/70 mmHg Patient Gender: M                  HR:           75 bpm. Exam Location:  Inpatient Procedure: 2D Echo, Color Doppler and Cardiac Doppler (Both Spectral and Color            Flow Doppler were utilized during procedure). Indications:    Bacteremia  History:        Patient has no prior history of Echocardiogram examinations.                 Risk Factors:Polysubstance Abuse.  Sonographer:    Sherline Distel Senior RDCS Referring Phys: [id number] [address] K CHIU IMPRESSIONS  1. Left ventricular ejection fraction, by estimation, is 60 to 65%. The left ventricle has normal function. The left ventricle has no regional wall motion abnormalities. Left ventricular diastolic parameters were normal.  2. Right ventricular systolic function is normal. The right ventricular size is normal. Tricuspid regurgitation signal is inadequate for assessing PA pressure.  3. The mitral valve is normal in structure. No evidence of mitral valve regurgitation. No evidence of mitral stenosis.  4. The aortic valve is tricuspid. Aortic valve regurgitation is not visualized. Aortic valve sclerosis/calcification is present, without any evidence of aortic stenosis.  5. The inferior vena cava is dilated in size with <50% respiratory variability, suggesting right atrial pressure of 15 mmHg. Conclusion(s)/Recommendation(s): No evidence of valvular vegetations on this transthoracic echocardiogram. Consider a transesophageal echocardiogram to exclude infective endocarditis if clinically  indicated. FINDINGS  Left Ventricle: Left ventricular ejection fraction, by estimation, is 60 to 65%. The left ventricle  has normal function. The left ventricle has no regional wall motion abnormalities. The left ventricular internal cavity size was normal in size. There is  no left ventricular hypertrophy. Left ventricular diastolic parameters were normal. Normal left ventricular filling pressure. Right Ventricle: The right ventricular size is normal. No increase in right ventricular wall thickness. Right ventricular systolic function is normal. Tricuspid regurgitation signal is inadequate for assessing PA pressure. Left Atrium: Left atrial size was normal in size. Right Atrium: Right atrial size was normal in size. Pericardium: There is no evidence of pericardial effusion. Mitral Valve: The mitral valve is normal in structure. No evidence of mitral valve regurgitation. No evidence of mitral valve stenosis. Tricuspid Valve: The tricuspid valve is normal in structure. Tricuspid valve regurgitation is trivial. No evidence of tricuspid stenosis. Aortic Valve: The aortic valve is tricuspid. Aortic valve regurgitation is not visualized. Aortic valve sclerosis/calcification is present, without any evidence of aortic stenosis. Pulmonic Valve: The pulmonic valve was normal in structure. Pulmonic valve regurgitation is not visualized. No evidence of pulmonic stenosis. Aorta: The aortic root is normal in size and structure. Venous: The inferior vena cava is dilated in size with less than 50% respiratory variability, suggesting right atrial pressure of 15 mmHg. IAS/Shunts: No atrial level shunt detected by color flow Doppler.  LEFT VENTRICLE PLAX 2D LVIDd:         5.40 cm   Diastology LVIDs:         3.70 cm   LV e' medial:    11.65 cm/s LV PW:         0.80 cm   LV E/e' medial:  7.0 LV IVS:        0.80 cm   LV e' lateral:   15.20 cm/s LVOT diam:     2.40 cm   LV E/e' lateral: 5.4 LV SV:         77 LV SV Index:   43 LVOT Area:     4.52 cm  RIGHT VENTRICLE RV S prime:     13.10 cm/s TAPSE (M-mode): 2.7 cm LEFT ATRIUM             Index         RIGHT ATRIUM           Index LA diam:        2.30 cm 1.29 cm/m   RA Area:     16.00 cm LA Vol (A2C):   52.2 ml 29.33 ml/m  RA Volume:   40.70 ml  22.87 ml/m LA Vol (A4C):   57.0 ml 32.03 ml/m LA Biplane Vol: 57.3 ml 32.20 ml/m  AORTIC VALVE LVOT Vmax:   88.90 cm/s LVOT Vmean:  65.600 cm/s LVOT VTI:    0.170 m  AORTA Ao Root diam: 3.60 cm MITRAL VALVE MV Area (PHT): 2.59 cm    SHUNTS MV Decel Time: 293 msec    Systemic VTI:  0.17 m MV E velocity: 81.70 cm/s  Systemic Diam: 2.40 cm MV A velocity: 56.00 cm/s MV E/A ratio:  1.46 Gaylyn Keas MD Electronically signed by Gaylyn Keas MD Signature Date/Time: 10/06/2023/4:58:47 PM    Final    Scheduled Meds:  docusate sodium   100 mg Oral BID   nicotine   21 mg Transdermal Daily   pantoprazole   40 mg Oral Daily   QUEtiapine   50 mg Oral QHS   Continuous Infusions:  ceFEPime  (MAXIPIME ) IV 2 g (10/08/23 0623)   vancomycin  1,000 mg (10/08/23 0509)     LOS: 1 day   Time spent:  Haydee Lipa, DO [facility]  If 7PM-7AM, please contact night-coverage [email or web address]  10/08/2023, 7:54 AM

## 2023-10-09 DIAGNOSIS — N431 Infected hydrocele: Secondary | ICD-10-CM | POA: Diagnosis not present

## 2023-10-09 LAB — CULTURE, BLOOD (ROUTINE X 2): Culture: NO GROWTH

## 2023-10-09 MED ORDER — METHOCARBAMOL 1000 MG/10ML IJ SOLN
500.0000 mg | Freq: Once | INTRAMUSCULAR | Status: AC
Start: 2023-10-09 — End: 2023-10-09
  Administered 2023-10-09: 500 mg via INTRAVENOUS
  Filled 2023-10-09: qty 10

## 2023-10-09 NOTE — Plan of Care (Signed)

## 2023-10-09 NOTE — Plan of Care (Signed)

## 2023-10-09 NOTE — Progress Notes (Signed)
 PROGRESS NOTE    Chris Randall  WUJ:[id number] DOB: June 03, 1975 DOA: 10/07/2023 PCP: Patient, No Pcp Per   Brief Narrative:  Chris Randall is a 48 y.o. male with medical history significant of polysubstance use (EtOH, opioids, methamphetamine, cocaine, tobacco) with substance-induced mood disorder, GERD/Barrett's esophagus, homelessness who presented to ED with left testicular swelling. He was admitted on 5/13 for same complaints to [facility] for UTI/pyelonephritis and scrotal cellulitis and substance induced mood disorder with psychosis/hallucinations and paranoia. He left AMA 5/16 at 9:45AM, refused to be seen by urology and wanted pain medication. He has now returned.  Assessment & Plan:   Principal Problem:   Infected hydrocele Active Problems:   Polysubstance abuse (HCC)   Substance induced mood disorder (HCC)   GERD/Barrett's esophagus/varices   Tobacco abuse  Infected hydrocele Questionable UTI/hematuria  Cellulitis 48 year old presenting to ED with worsening pain, swelling of his left scrotum and imaging finding of infected/reactive hydrocele -Continue antibiotics to vanc/cefepime  given his drug use hx -GC/Chlamydia, RPR and HIV negative -Follow previous culture -no growth to date -Urology recommending 10 days of antibiotics and to transition to oral once culture results and sensitivities are finalized. No findings or concerns for Fournier's.  -pain control with oral/IV for now  -elevate scrotum, recommended jock strap for when he leaves  -schedule colace    Polysubstance abuse (HCC) UDS positive for cocaine, amphetamines and opiates on 10/05/23  He is wanting to go to rehab to get off meth after discharge; TOC following Hep C ab positive Nicotine  patch as requested   Substance induced mood disorder (HCC) Rule out acute psychiatric event/paranoia -left AMA 5/16 am - he felt his life was in danger  -psychiatry consulted -appreciate insight recommendations, no change to  medications at this time -continue seroquel  50mg  nightly    Topical dermatitis -Likely secondary to positioning - benadryl  cream TID as appropriate  GERD/Barrett's esophagus/varices Start PPI   DVT prophylaxis: SCDs Start: 10/07/23 1721 Place TED hose Start: 10/07/23 1721   Code Status:   Code Status: Full Code  Family Communication: None present  Status is: Inpt  Dispo: The patient is from: Home              Anticipated d/c is to: TBD              Anticipated d/c date is: TBD              Patient currently NOT medically stable for discharge given ongoing need for IV antibiotics  Consultants:  Urology, Pysch  Procedures:  None  Antimicrobials:  Vancomycin , cefepime  x10 days   Subjective: Noted hematuria overnight, scant x 1, no other acute issues or events  Objective: Vitals:   10/08/23 0442 10/08/23 1222 10/08/23 2012 10/09/23 0422  BP: 100/64 (!) 98/55 97/62 103/63  Pulse: 61 62 66 60  Resp: 18 20 18 18   Temp: 98 F (36.7 C) 98 F (36.7 C) 98.2 F (36.8 C) 98.1 F (36.7 C)  TempSrc: Oral Oral Oral Oral  SpO2: 98% 98% 97% 97%  Weight:      Height:        Intake/Output Summary (Last 24 hours) at 10/09/2023 0800 Last data filed at 10/08/2023 1700 Gross per 24 hour  Intake 1080 ml  Output --  Net 1080 ml   Filed Weights   10/07/23 1718  Weight: 72.6 kg    Examination:  General exam: Appears calm and comfortable  Respiratory system: Clear to auscultation. Respiratory effort  normal. Cardiovascular system: S1 & S2 heard, RRR. No JVD, murmurs, rubs, gallops or clicks. No pedal edema. Gastrointestinal system: Abdomen is nondistended, soft and nontender. No organomegaly or masses felt. Normal bowel sounds heard. Central nervous system: Alert and oriented. No focal neurological deficits. Extremities: Symmetric 5 x 5 power. Skin: Blanching erythema of L superior buttock and L forearm improving  Data Reviewed: I have personally reviewed following labs and  imaging studies  CBC: Recent Labs  Lab 10/04/23 1542 10/05/23 0503 10/06/23 1300 10/07/23 0656 10/07/23 1337 10/08/23 0713  WBC 19.0* 19.8* 11.0* 5.8 6.6 5.4  NEUTROABS 17.1*  --   --   --  4.3  --   HGB 13.5 12.7* 11.8* 12.3* 12.3* 13.4  HCT 41.8 37.7* 36.4* 37.7* 39.4 42.5  MCV 85.5 83.0 85.0 83.8 88.5 86.4  PLT 289 293 244 263 292 331   Basic Metabolic Panel: Recent Labs  Lab 10/05/23 0503 10/06/23 1300 10/07/23 0656 10/07/23 1337 10/08/23 0713  NA 133* 133* 134* 135 135  K 3.6 3.9 3.7 4.1 4.1  CL 101 101 102 100 98  CO2 25 22 25 27 27   GLUCOSE 87 181* 109* 96 94  BUN 13 9 7 10 10   CREATININE 0.93 0.83 0.76 0.86 0.67  CALCIUM 8.4* 8.0* 8.2* 8.4* 8.9   GFR: Estimated Creatinine Clearance: 105.4 mL/min (by C-G formula based on SCr of 0.67 mg/dL). Liver Function Tests: Recent Labs  Lab 10/04/23 1542 10/06/23 1300 10/07/23 0656 10/07/23 1337  AST 33 15 13* 16  ALT 25 14 14 17   ALKPHOS 74 55 64 77  BILITOT 1.6* 0.5 0.5 0.5  PROT 6.2* 5.9* 5.7* 6.8  ALBUMIN 3.6 2.8* 2.6* 3.2*   Recent Labs  Lab 10/04/23 1730  LIPASE 22   Anemia Panel: Recent Labs    10/07/23 1632  VITAMINB12 238   Sepsis Labs: Recent Labs  Lab 10/04/23 1730  LATICACIDVEN 1.0    Recent Results (from the past 240 hours)  Urine Culture     Status: None   Collection Time: 10/04/23  5:11 PM   Specimen: Urine, Clean Catch  Result Value Ref Range Status   Specimen Description URINE, CLEAN CATCH  Final   Special Requests NONE  Final   Culture   Final    NO GROWTH Performed at [facility], [address] [place], [place] [address]    Report Status 10/06/2023 FINAL  Final  Blood culture (routine x 2)     Status: None   Collection Time: 10/04/23  5:30 PM   Specimen: BLOOD  Result Value Ref Range Status   Specimen Description BLOOD RIGHT ANTECUBITAL  Final   Special Requests   Final    BOTTLES DRAWN AEROBIC AND ANAEROBIC Blood Culture results may not be optimal due to an  inadequate volume of blood received in culture bottles   Culture   Final    NO GROWTH 5 DAYS Performed at [facility], [address] [place], [place] [address]    Report Status 10/09/2023 FINAL  Final  Culture, blood (Routine X 2) w Reflex to ID Panel     Status: None (Preliminary result)   Collection Time: 10/05/23  5:03 AM   Specimen: BLOOD  Result Value Ref Range Status   Specimen Description BLOOD LEFT ANTECUBITAL  Final   Special Requests   Final    BOTTLES DRAWN AEROBIC AND ANAEROBIC Blood Culture results may not be optimal due to an inadequate volume of  blood received in culture bottles   Culture   Final    NO GROWTH 4 DAYS Performed at [facility], [address], [place], [place] [address]    Report Status PENDING  Incomplete    Radiology Studies: US  SCROTUM W/DOPPLER Result Date: 10/07/2023 CLINICAL DATA:  Left testicular pain for 5 days. EXAM: SCROTAL ULTRASOUND DOPPLER ULTRASOUND OF THE TESTICLES TECHNIQUE: Complete ultrasound examination of the testicles, epididymis, and other scrotal structures was performed. Color and spectral Doppler ultrasound were also utilized to evaluate blood flow to the testicles. COMPARISON:  Oct 04, 2023. FINDINGS: Right testicle Measurements: 4.7 x 2.5 x 2.5 cm. No mass or microlithiasis visualized. Left testicle Measurements: 4.1 x 2.7 x 2.7 cm. No mass or microlithiasis visualized. Right epididymis:  Normal in size and appearance. Left epididymis:  Normal in size and appearance. Hydrocele: Small right hydrocele is again noted. There is now noted moderate size complex left hydrocele concerning for infection. Varicocele:  None visualized. Pulsed Doppler interrogation of both testes demonstrates normal low resistance arterial and venous waveforms bilaterally. IMPRESSION: No evidence of testicular mass or torsion. There is now noted moderate size complex left hydrocele with multiple septations concerning for infection. Electronically Signed    By: Rosalene Colon M.D.   On: 10/07/2023 14:37   Scheduled Meds:  docusate sodium   100 mg Oral BID   nicotine   21 mg Transdermal Daily   pantoprazole   40 mg Oral Daily   QUEtiapine   50 mg Oral QHS   Continuous Infusions:  ceFEPime  (MAXIPIME ) IV 2 g (10/09/23 0602)   vancomycin  1,000 mg (10/09/23 0424)     LOS: 2 days   Time spent:  Haydee Lipa, DO [facility]  If 7PM-7AM, please contact night-coverage [email or web address]  10/09/2023, 8:00 AM

## 2023-10-09 NOTE — Plan of Care (Signed)

## 2023-10-10 DIAGNOSIS — N431 Infected hydrocele: Secondary | ICD-10-CM | POA: Diagnosis not present

## 2023-10-10 LAB — CBC
HCT: 45.5 % (ref 39.0–52.0)
Hemoglobin: 14.2 g/dL (ref 13.0–17.0)
MCH: 27 pg (ref 26.0–34.0)
MCHC: 31.2 g/dL (ref 30.0–36.0)
MCV: 86.7 fL (ref 80.0–100.0)
Platelets: 374 10*3/uL (ref 150–400)
RBC: 5.25 MIL/uL (ref 4.22–5.81)
RDW: 13 % (ref 11.5–15.5)
WBC: 6.2 10*3/uL (ref 4.0–10.5)
nRBC: 0 % (ref 0.0–0.2)

## 2023-10-10 LAB — BASIC METABOLIC PANEL WITH GFR
Anion gap: 8 (ref 5–15)
BUN: 13 mg/dL (ref 6–20)
CO2: 31 mmol/L (ref 22–32)
Calcium: 8.9 mg/dL (ref 8.9–10.3)
Chloride: 97 mmol/L — ABNORMAL LOW (ref 98–111)
Creatinine, Ser: 0.71 mg/dL (ref 0.61–1.24)
GFR, Estimated: >60 mL/min (ref >60)
Glucose, Bld: 107 mg/dL — ABNORMAL HIGH (ref 70–99)
Potassium: 4.2 mmol/L (ref 3.5–5.1)
Sodium: 136 mmol/L (ref 135–145)

## 2023-10-10 LAB — CULTURE, BLOOD (ROUTINE X 2): Culture: NO GROWTH

## 2023-10-10 NOTE — Progress Notes (Signed)
 PROGRESS NOTE    Chris Randall  ZOX:[id number] DOB: March 04, 1976 DOA: 10/07/2023 PCP: Patient, No Pcp Per   Brief Narrative:  Chris Randall is a 48 y.o. male with medical history significant of polysubstance use (EtOH, opioids, methamphetamine, cocaine, tobacco) with substance-induced mood disorder, GERD/Barrett's esophagus, homelessness who presented to ED with left testicular swelling. He was admitted on 5/13 for same complaints to [facility] for UTI/pyelonephritis and scrotal cellulitis and substance induced mood disorder with psychosis/hallucinations and paranoia. He left AMA 5/16 at 9:45AM, refused to be seen by urology and wanted pain medication. He has now returned.  Assessment & Plan:   Principal Problem:   Infected hydrocele Active Problems:   Polysubstance abuse (HCC)   Substance induced mood disorder (HCC)   GERD/Barrett's esophagus/varices   Tobacco abuse  Infected hydrocele Questionable UTI/hematuria  Cellulitis 48 year old presenting to ED with worsening pain, swelling of his left scrotum and imaging finding of infected/reactive hydrocele -Continue antibiotics to vanc/cefepime  given his drug use hx -GC/Chlamydia, RPR and HIV negative -Follow previous culture -no growth to date -Urology recommending 10 days of antibiotics and to transition to oral once culture results and sensitivities are finalized. No findings or concerns for Fournier's.  -pain control with oral/IV for now  -elevate scrotum, recommended jock strap for when he leaves  -schedule colace    Polysubstance abuse (HCC) UDS positive for cocaine, amphetamines and opiates on 10/05/23  He is wanting to go to rehab to get off meth after discharge; TOC following Hep C ab positive Nicotine  patch as requested   Substance induced mood disorder (HCC) Rule out acute psychiatric event/paranoia -left AMA 5/16 am - he felt his life was in danger  -psychiatry consulted -appreciate insight recommendations, no change to  medications at this time -continue seroquel  50mg  nightly    Topical dermatitis -Likely secondary to positioning - benadryl  cream TID as appropriate  GERD/Barrett's esophagus/varices Start PPI   DVT prophylaxis: SCDs Start: 10/07/23 1721 Place TED hose Start: 10/07/23 1721   Code Status:   Code Status: Full Code  Family Communication: None present  Status is: Inpt  Dispo: The patient is from: Home              Anticipated d/c is to: TBD              Anticipated d/c date is: TBD              Patient currently NOT medically stable for discharge given ongoing need for IV antibiotics  Consultants:  Urology, Pysch  Procedures:  None  Antimicrobials:  Vancomycin , cefepime  x10 days   Subjective: No acute issues or events reported overnight  Objective: Vitals:   10/09/23 1700 10/09/23 1934 10/09/23 2127 10/10/23 0437  BP: 120/72 (!) 97/55 117/66 114/63  Pulse: 70 67 64 (!) 58  Resp: 18 18  18   Temp: 98.3 F (36.8 C) 98.1 F (36.7 C)  98.6 F (37 C)  TempSrc: Oral Oral  Oral  SpO2: 97% 100%  99%  Weight:      Height:        Intake/Output Summary (Last 24 hours) at 10/10/2023 0811 Last data filed at 10/09/2023 2000 Gross per 24 hour  Intake 1700 ml  Output --  Net 1700 ml   Filed Weights   10/07/23 1718  Weight: 72.6 kg    Examination:  General exam: Appears calm and comfortable  Respiratory system: Clear to auscultation. Respiratory effort normal. Cardiovascular system: S1 & S2 heard,  RRR. No JVD, murmurs, rubs, gallops or clicks. No pedal edema. Gastrointestinal system: Abdomen is nondistended, soft and nontender. No organomegaly or masses felt. Normal bowel sounds heard. Central nervous system: Alert and oriented. No focal neurological deficits. Extremities: Symmetric 5 x 5 power. Skin: Blanching erythema of L superior buttock and L forearm improving  Data Reviewed: I have personally reviewed following labs and imaging studies  CBC: Recent Labs  Lab  10/04/23 1542 10/05/23 0503 10/06/23 1300 10/07/23 0656 10/07/23 1337 10/08/23 0713  WBC 19.0* 19.8* 11.0* 5.8 6.6 5.4  NEUTROABS 17.1*  --   --   --  4.3  --   HGB 13.5 12.7* 11.8* 12.3* 12.3* 13.4  HCT 41.8 37.7* 36.4* 37.7* 39.4 42.5  MCV 85.5 83.0 85.0 83.8 88.5 86.4  PLT 289 293 244 263 292 331   Basic Metabolic Panel: Recent Labs  Lab 10/05/23 0503 10/06/23 1300 10/07/23 0656 10/07/23 1337 10/08/23 0713  NA 133* 133* 134* 135 135  K 3.6 3.9 3.7 4.1 4.1  CL 101 101 102 100 98  CO2 25 22 25 27 27   GLUCOSE 87 181* 109* 96 94  BUN 13 9 7 10 10   CREATININE 0.93 0.83 0.76 0.86 0.67  CALCIUM 8.4* 8.0* 8.2* 8.4* 8.9   GFR: Estimated Creatinine Clearance: 105.4 mL/min (by C-G formula based on SCr of 0.67 mg/dL). Liver Function Tests: Recent Labs  Lab 10/04/23 1542 10/06/23 1300 10/07/23 0656 10/07/23 1337  AST 33 15 13* 16  ALT 25 14 14 17   ALKPHOS 74 55 64 77  BILITOT 1.6* 0.5 0.5 0.5  PROT 6.2* 5.9* 5.7* 6.8  ALBUMIN 3.6 2.8* 2.6* 3.2*   Recent Labs  Lab 10/04/23 1730  LIPASE 22   Anemia Panel: Recent Labs    10/07/23 1632  VITAMINB12 238   Sepsis Labs: Recent Labs  Lab 10/04/23 1730  LATICACIDVEN 1.0    Recent Results (from the past 240 hours)  Urine Culture     Status: None   Collection Time: 10/04/23  5:11 PM   Specimen: Urine, Clean Catch  Result Value Ref Range Status   Specimen Description URINE, CLEAN CATCH  Final   Special Requests NONE  Final   Culture   Final    NO GROWTH Performed at [facility], [address], [place], [place] [address]    Report Status 10/06/2023 FINAL  Final  Blood culture (routine x 2)     Status: None   Collection Time: 10/04/23  5:30 PM   Specimen: BLOOD  Result Value Ref Range Status   Specimen Description BLOOD RIGHT ANTECUBITAL  Final   Special Requests   Final    BOTTLES DRAWN AEROBIC AND ANAEROBIC Blood Culture results may not be optimal due to an inadequate volume of blood received in  culture bottles   Culture   Final    NO GROWTH 5 DAYS Performed at [facility], [address], [place], [place] [address]    Report Status 10/09/2023 FINAL  Final  Culture, blood (Routine X 2) w Reflex to ID Panel     Status: None   Collection Time: 10/05/23  5:03 AM   Specimen: BLOOD  Result Value Ref Range Status   Specimen Description BLOOD LEFT ANTECUBITAL  Final   Special Requests   Final    BOTTLES DRAWN AEROBIC AND ANAEROBIC Blood Culture results may not be optimal due to an inadequate volume of blood received in culture bottles   Culture  Final    NO GROWTH 5 DAYS Performed at [facility], [address] [place], [place] [address]    Report Status 10/10/2023 FINAL  Final    Radiology Studies: No results found.  Scheduled Meds:  docusate sodium   100 mg Oral BID   nicotine   21 mg Transdermal Daily   pantoprazole   40 mg Oral Daily   QUEtiapine   50 mg Oral QHS   Continuous Infusions:  ceFEPime  (MAXIPIME ) IV 2 g (10/10/23 0702)   vancomycin  1,000 mg (10/10/23 0539)     LOS: 3 days   Time spent:  Haydee Lipa, DO [facility]  If 7PM-7AM, please contact night-coverage [email or web address]  10/10/2023, 8:11 AM

## 2023-10-10 NOTE — Plan of Care (Signed)

## 2023-10-10 NOTE — TOC Initial Note (Signed)
 Transition of Care [facility]) - Initial/Assessment Note    Patient Details  Name: Chris Randall MRN: [id number] Date of Birth: 1976-01-09  Transition of Care [facility]) CM/SW Contact:    Jonni Nettle, LCSW Phone Number: 10/10/2023, 10:20 AM  Clinical Narrative:                 CSW met with pt at bedside to discuss SDOH risk and discharge planning. Pt shared he wants to go to [facility] following hospital stay. Pt reports he called [facility], reports they have beds available. CSW offered additional resources for housing, food, transportation, and utilities. Pt denied additional resources; states he is only focused on going to rehab for substance abuse. Pt states he will work on his housing instability once he gets out of rehab. CSW sent referral to [facility] via fax, [phone]. TOC will continue to follow.   Expected Discharge Plan: Homeless Shelter Barriers to Discharge: Continued Medical Work up   Patient Goals and CMS Choice Patient states their goals for this hospitalization and ongoing recovery are:: To go to [facility] for rehab    Expected Discharge Plan and Services In-house Referral: Clinical Social Work Discharge Planning Services: Other - See comment (Referral to [facility]) Post Acute Care Choice: NA Living arrangements for the past 2 months: Homeless                 DME Arranged: N/A DME Agency: NA       HH Arranged: NA    Prior Living Arrangements/Services Living arrangements for the past 2 months: Homeless Lives with:: Self Patient language and need for interpreter reviewed:: Yes Do you feel safe going back to the place where you live?: No   Pt expressed wanting to go straight from hospital to rehab for substance abuse  Need for Family Participation in Patient Care: No (Comment) Care giver support system in place?: No (comment)   Criminal Activity/Legal Involvement Pertinent to Current Situation/Hospitalization: No - Comment as  needed  Activities of Daily Living N/A    Permission Sought/Granted Permission sought to share information with : Facility Industrial/product designer granted to share information with : Yes, Verbal Permission Granted     Permission granted to share info w AGENCY: [facility]  Emotional Assessment Appearance:: Appears stated age Attitude/Demeanor/Rapport: Guarded, Engaged, Ambitious Affect (typically observed): Guarded, Stable, Pleasant Orientation: : Oriented to Self, Oriented to Place, Oriented to  Time, Oriented to Situation Alcohol / Substance Use: Illicit Drugs Psych Involvement: No (comment) (psych cleared by psych provider)  Admission diagnosis:  Infected hydrocele [N43.1] Patient Active Problem List   Diagnosis Date Noted   Infected hydrocele 10/07/2023   Polysubstance abuse (HCC) 10/07/2023   Pyelonephritis 10/07/2023   Hallucinations 10/07/2023   Cellulitis of scrotum 10/05/2023   UTI (urinary tract infection) 10/04/2023   Rash and nonspecific skin eruption 09/20/2023   History of learning disability 09/20/2023   Limited literacy 09/20/2023   Mood disorder (HCC) 09/19/2023   Severe stimulant use disorder (HCC) 09/19/2023   Stimulant-induced mood disorder (HCC) 09/19/2023   Opioid use disorder, severe, dependence (HCC) 12/29/2016   Cocaine use disorder, mild, abuse (HCC) 12/29/2016   Substance or medication-induced bipolar and related disorder with onset during intoxication (HCC) 12/29/2016   GERD/Barrett's esophagus/varices 12/29/2016   Suicidal ideation 12/26/2016   Severe episode of recurrent major depressive disorder, without psychotic features (HCC) 12/26/2016   Substance induced mood disorder (HCC) 12/26/2016   Alcohol abuse with physiological dependence (HCC) 12/26/2016   Hepatitis  C    Hematemesis/vomiting blood 08/02/2016   Abdominal pain 08/02/2016   Tobacco abuse 08/02/2016   Asthma 08/02/2016   Esophageal varices determined by endoscopy (HCC)  08/02/2016   Abnormal transaminases    Erosive esophagitis    Erosive gastritis with hemorrhage    PCP:  Patient, No Pcp Per Pharmacy:   [facility] - [place], [place] - [address] [place] [address] Phone: [phone] Fax: [phone]  [facility] [place] [facility] [address] [place] [address] Phone: [phone] Fax: [phone]  [id number] [place], [place] - [address] [facility] [address] [place] [address] Phone: [phone] Fax: [phone]  [facility] [address] [place] [address] Phone: [phone] Fax: [phone]     Social Drivers of Health (SDOH) Social History: SDOH Screenings   Food Insecurity: Food Insecurity Present (10/07/2023)  Housing: High Risk (10/07/2023)  Transportation Needs: Unmet Transportation Needs (10/07/2023)  Utilities: At Risk (10/07/2023)  Alcohol Screen: Low Risk  (09/19/2023)  Social Connections: Unknown (09/29/2021)   Received from [facility]  Stress: No Stress Concern Present (05/20/2021)   Received from [facility]  Tobacco Use: High Risk (10/04/2023)   SDOH Interventions: Pt denied resources for housing, food, and utilities.     Readmission Risk Interventions    10/10/2023   10:12 AM  Readmission Risk Prevention Plan  Transportation Screening Complete  PCP or Specialist Appt within 3-5 Days Patient refused  HRI or Home Care Consult Not Complete  HRI or Home Care Consult comments N/A  Social Work Consult for Recovery Care Planning/Counseling Complete  Palliative Care Screening Not Applicable  Medication Review Oceanographer) Complete

## 2023-10-11 DIAGNOSIS — N431 Infected hydrocele: Secondary | ICD-10-CM | POA: Diagnosis not present

## 2023-10-11 NOTE — TOC Progression Note (Addendum)
 Transition of Care [facility]) - Progression Note    Patient Details  Name: Chris Randall MRN: [id number] Date of Birth: Feb 11, 1976  Transition of Care [facility]) CM/SW Contact  Jonni Nettle, LCSW Phone Number: 10/11/2023, 10:52 AM  Clinical Narrative:    ADDENDUM  CSW met with pt at bedside to discuss admission to [facility] for residential substance abuse treatment. CSW advised pt that he will need proof of [facility] residence. Pt reports that he is currently homeless, does not have permanent address or any ID. However, his current mailing address is at his sister's house in [place]. CSW and pt spoke with pt's sister, Adelina Homme [phone], who will bring recent mail to hospital with pt's name and mailing address on it. TOC will continue to follow.  1:17 PM - CSW spoke with [facility] admissions staff, Octavio Ben [phone]. [phone], via phone call regarding admission to residential treatment. Per Ludwig Safer, pt will need medical clearance note and proof of [facility] residence, prior to admission. Pt will need to be at [facility] by 9 AM on day of admission. CSW will await for medical clearance from MD before scheduling admission date. TOC will continue to follow.  CSW left voicemail for Ludwig Safer [phone], admissions staff, at [facility]. CSW will await follow. up regarding pt's referral.   Expected Discharge Plan: Homeless Shelter Barriers to Discharge: Continued Medical Work up  Expected Discharge Plan and Services In-house Referral: Clinical Social Work Discharge Planning Services: Other - See comment (Referral to [facility]) Post Acute Care Choice: NA Living arrangements for the past 2 months: Homeless                 DME Arranged: N/A DME Agency: NA       HH Arranged: NA           Social Determinants of Health (SDOH) Interventions SDOH Screenings   Food Insecurity: Food Insecurity Present (10/07/2023)  Housing: High Risk  (10/07/2023)  Transportation Needs: Unmet Transportation Needs (10/07/2023)  Utilities: At Risk (10/07/2023)  Alcohol Screen: Low Risk  (09/19/2023)  Social Connections: Unknown (09/29/2021)   Received from [facility]  Stress: No Stress Concern Present (05/20/2021)   Received from [facility], [facility]  Tobacco Use: High Risk (10/04/2023)    Readmission Risk Interventions    10/10/2023   10:12 AM  Readmission Risk Prevention Plan  Transportation Screening Complete  PCP or Specialist Appt within 3-5 Days Patient refused  HRI or Home Care Consult Not Complete  HRI or Home Care Consult comments N/A  Social Work Consult for Recovery Care Planning/Counseling Complete  Palliative Care Screening Not Applicable  Medication Review Oceanographer) Complete

## 2023-10-11 NOTE — Plan of Care (Signed)

## 2023-10-11 NOTE — Progress Notes (Signed)
 PROGRESS NOTE    Chris Randall  NFA:[id number] DOB: 18-Mar-1976 DOA: 10/07/2023 PCP: Patient, No Pcp Per   Brief Narrative:  Chris Randall is a 48 y.o. male with medical history significant of polysubstance use (EtOH, opioids, methamphetamine, cocaine, tobacco) with substance-induced mood disorder, GERD/Barrett's esophagus, homelessness who presented to ED with left testicular swelling. He was admitted on 5/13 for same complaints to [facility] for UTI/pyelonephritis and scrotal cellulitis and substance induced mood disorder with psychosis/hallucinations and paranoia. He left AMA 5/16 at 9:45AM, refused to be seen by urology and wanted pain medication. He has now returned.  Assessment & Plan:   Principal Problem:   Infected hydrocele Active Problems:   Polysubstance abuse (HCC)   Substance induced mood disorder (HCC)   GERD/Barrett's esophagus/varices   Tobacco abuse  Infected hydrocele Questionable UTI/hematuria  Cellulitis 48 year old presenting to ED with worsening pain, swelling of his left scrotum and imaging finding of infected/reactive hydrocele -Continue antibiotics to vanc/cefepime  given his drug use hx -GC/Chlamydia, RPR and HIV negative -Follow previous culture -no growth to date -Urology recommending 10 days of antibiotics and to transition to oral once culture results and sensitivities are finalized. No findings or concerns for Fournier's.  -pain control with oral/IV for now  -elevate scrotum, recommended jock strap for when he leaves  -schedule colace    Polysubstance abuse (HCC) UDS positive for cocaine, amphetamines and opiates on 10/05/23  He is wanting to go to rehab to get off meth after discharge; TOC following Hep C ab positive Nicotine  patch as requested   Substance induced mood disorder (HCC) Rule out acute psychiatric event/paranoia -left AMA 5/16 am - he felt his life was in danger  -psychiatry consulted -appreciate insight recommendations, no change to  medications at this time -continue seroquel  50mg  nightly    Topical dermatitis -Likely secondary to positioning - benadryl  cream TID as appropriate  GERD/Barrett's esophagus/varices Start PPI   DVT prophylaxis: SCDs Start: 10/07/23 1721 Place TED hose Start: 10/07/23 1721   Code Status:   Code Status: Full Code  Family Communication: None present  Status is: Inpt  Dispo: The patient is from: Home              Anticipated d/c is to: TBD              Anticipated d/c date is: TBD              Patient currently NOT medically stable for discharge given ongoing need for IV antibiotics  Consultants:  Urology, Pysch  Procedures:  None  Antimicrobials:  Vancomycin , cefepime  x10 days   Subjective: No acute issues or events reported overnight  Objective: Vitals:   10/10/23 0437 10/10/23 1215 10/10/23 2021 10/11/23 0546  BP: 114/63 118/66 120/71 105/65  Pulse: (!) 58 64 70 (!) 58  Resp: 18 18 16 18   Temp: 98.6 F (37 C) 98 F (36.7 C) 98.1 F (36.7 C) 98 F (36.7 C)  TempSrc: Oral     SpO2: 99% 100% 100% 99%  Weight:      Height:        Intake/Output Summary (Last 24 hours) at 10/11/2023 0819 Last data filed at 10/10/2023 2103 Gross per 24 hour  Intake 240 ml  Output --  Net 240 ml   Filed Weights   10/07/23 1718  Weight: 72.6 kg    Examination:  General exam: Appears calm and comfortable  Respiratory system: Clear to auscultation. Respiratory effort normal. Cardiovascular system: S1 &  S2 heard, RRR. No JVD, murmurs, rubs, gallops or clicks. No pedal edema. Gastrointestinal system: Abdomen is nondistended, soft and nontender. No organomegaly or masses felt. Normal bowel sounds heard. Central nervous system: Alert and oriented. No focal neurological deficits. Extremities: Symmetric 5 x 5 power. Skin: Blanching erythema of L superior buttock and L forearm improving  Data Reviewed: I have personally reviewed following labs and imaging studies  CBC: Recent  Labs  Lab 10/04/23 1542 10/05/23 0503 10/06/23 1300 10/07/23 0656 10/07/23 1337 10/08/23 0713 10/10/23 0819  WBC 19.0*   < > 11.0* 5.8 6.6 5.4 6.2  NEUTROABS 17.1*  --   --   --  4.3  --   --   HGB 13.5   < > 11.8* 12.3* 12.3* 13.4 14.2  HCT 41.8   < > 36.4* 37.7* 39.4 42.5 45.5  MCV 85.5   < > 85.0 83.8 88.5 86.4 86.7  PLT 289   < > 244 263 292 331 374   < > = values in this interval not displayed.   Basic Metabolic Panel: Recent Labs  Lab 10/06/23 1300 10/07/23 0656 10/07/23 1337 10/08/23 0713 10/10/23 0819  NA 133* 134* 135 135 136  K 3.9 3.7 4.1 4.1 4.2  CL 101 102 100 98 97*  CO2 22 25 27 27 31   GLUCOSE 181* 109* 96 94 107*  BUN 9 7 10 10 13   CREATININE 0.83 0.76 0.86 0.67 0.71  CALCIUM 8.0* 8.2* 8.4* 8.9 8.9   GFR: Estimated Creatinine Clearance: 105.4 mL/min (by C-G formula based on SCr of 0.71 mg/dL). Liver Function Tests: Recent Labs  Lab 10/04/23 1542 10/06/23 1300 10/07/23 0656 10/07/23 1337  AST 33 15 13* 16  ALT 25 14 14 17   ALKPHOS 74 55 64 77  BILITOT 1.6* 0.5 0.5 0.5  PROT 6.2* 5.9* 5.7* 6.8  ALBUMIN 3.6 2.8* 2.6* 3.2*   Recent Labs  Lab 10/04/23 1730  LIPASE 22   Anemia Panel: No results for input(s): "VITAMINB12", "FOLATE", "FERRITIN", "TIBC", "IRON", "RETICCTPCT" in the last 72 hours.  Sepsis Labs: Recent Labs  Lab 10/04/23 1730  LATICACIDVEN 1.0    Recent Results (from the past 240 hours)  Urine Culture     Status: None   Collection Time: 10/04/23  5:11 PM   Specimen: Urine, Clean Catch  Result Value Ref Range Status   Specimen Description URINE, CLEAN CATCH  Final   Special Requests NONE  Final   Culture   Final    NO GROWTH Performed at [facility], [address], [place], [place] [address]    Report Status 10/06/2023 FINAL  Final  Blood culture (routine x 2)     Status: None   Collection Time: 10/04/23  5:30 PM   Specimen: BLOOD  Result Value Ref Range Status   Specimen Description BLOOD RIGHT ANTECUBITAL   Final   Special Requests   Final    BOTTLES DRAWN AEROBIC AND ANAEROBIC Blood Culture results may not be optimal due to an inadequate volume of blood received in culture bottles   Culture   Final    NO GROWTH 5 DAYS Performed at [facility], [address], [place], [place] [address]    Report Status 10/09/2023 FINAL  Final  Culture, blood (Routine X 2) w Reflex to ID Panel     Status: None   Collection Time: 10/05/23  5:03 AM   Specimen: BLOOD  Result Value Ref Range Status   Specimen Description  BLOOD LEFT ANTECUBITAL  Final   Special Requests   Final    BOTTLES DRAWN AEROBIC AND ANAEROBIC Blood Culture results may not be optimal due to an inadequate volume of blood received in culture bottles   Culture   Final    NO GROWTH 5 DAYS Performed at [facility], [address] [place], [place] [address]    Report Status 10/10/2023 FINAL  Final    Radiology Studies: No results found.  Scheduled Meds:  docusate sodium   100 mg Oral BID   nicotine   21 mg Transdermal Daily   pantoprazole   40 mg Oral Daily   QUEtiapine   50 mg Oral QHS   Continuous Infusions:  ceFEPime  (MAXIPIME ) IV 2 g (10/11/23 0559)   vancomycin  1,000 mg (10/11/23 0447)     LOS: 4 days   Time spent:  Haydee Lipa, DO [facility]  If 7PM-7AM, please contact night-coverage [email or web address]  10/11/2023, 8:19 AM

## 2023-10-12 DIAGNOSIS — N431 Infected hydrocele: Secondary | ICD-10-CM | POA: Diagnosis not present

## 2023-10-12 NOTE — TOC Progression Note (Signed)
 Transition of Care [facility]) - Progression Note    Patient Details  Name: Chris Randall MRN: [id number] Date of Birth: 1975-07-19  Transition of Care [facility]) CM/SW Contact  Tessie Fila, RN Phone Number: 10/12/2023, 12:52 PM  Clinical Narrative:    Met with pt to receive the address for proof of residence. He states that his mail goes to [address] [place] [address]. Informed pt that proof of residence in [place] would be needed for admittance into [facility]. Pt states understanding and says his daughter will be able to bring his mail with correct address to the hospital. Faxed referral to [facility] at [phone]. TOC following.   Expected Discharge Plan: Homeless Shelter Barriers to Discharge: Continued Medical Work up  Expected Discharge Plan and Services In-house Referral: Clinical Social Work Discharge Planning Services: Other - See comment (Referral to [facility]) Post Acute Care Choice: NA Living arrangements for the past 2 months: Homeless                 DME Arranged: N/A DME Agency: NA       HH Arranged: NA           Social Determinants of Health (SDOH) Interventions SDOH Screenings   Food Insecurity: Food Insecurity Present (10/07/2023)  Housing: High Risk (10/07/2023)  Transportation Needs: Unmet Transportation Needs (10/07/2023)  Utilities: At Risk (10/07/2023)  Alcohol Screen: Low Risk  (09/19/2023)  Social Connections: Unknown (09/29/2021)   Received from [facility]  Stress: No Stress Concern Present (05/20/2021)   Received from [facility], [facility]  Tobacco Use: High Risk (10/04/2023)    Readmission Risk Interventions    10/10/2023   10:12 AM  Readmission Risk Prevention Plan  Transportation Screening Complete  PCP or Specialist Appt within 3-5 Days Patient refused  HRI or Home Care Consult Not Complete  HRI or Home Care Consult comments N/A  Social Work Consult for Recovery Care  Planning/Counseling Complete  Palliative Care Screening Not Applicable  Medication Review Ocean[employer]) Complete

## 2023-10-12 NOTE — Plan of Care (Signed)

## 2023-10-12 NOTE — Progress Notes (Signed)
 PROGRESS NOTE    Chris Randall  ZOX:[id number] DOB: 10-29-1975 DOA: 10/07/2023 PCP: Patient, No Pcp Per   Brief Narrative:  Chris Randall is a 48 y.o. male with medical history significant of polysubstance use (EtOH, opioids, methamphetamine, cocaine, tobacco) with substance-induced mood disorder, GERD/Barrett's esophagus, homelessness who presented to ED with left testicular swelling. He was admitted on 5/13 for same complaints to [facility] for UTI/pyelonephritis and scrotal cellulitis and substance induced mood disorder with psychosis/hallucinations and paranoia. He left AMA 5/16 at 9:45AM, refused to be seen by urology and wanted pain medication. He has now returned.  Subjective: Still complaining of swelling and tenderness of testicle area, with pain radiating to the groin and abdomen area. Otherwise hemodynamically, afebrile, normotensive   Assessment & Plan:   Principal Problem:   Infected hydrocele Active Problems:   Polysubstance abuse (HCC)   Substance induced mood disorder (HCC)   GERD/Barrett's esophagus/varices   Tobacco abuse  Infected hydrocele Questionable UTI/hematuria  Cellulitis 48 year old presenting to ED with worsening pain, swelling of his left scrotum and imaging finding of infected/reactive hydrocele  -Currently hemodynamically stable  -Continue antibiotics to vanc/cefepime  given his drug use hx -GC/Chlamydia, RPR and HIV negative -Cultures reviewed, no growth to date -Cussing with pharmacy in anticipation of changing antibiotics to p.o.  -Urology recommending 10 days of antibiotics and to transition to oral once culture results and sensitivities are finalized.  No findings or concerns for Fournier's.   -pain control with oral/IV for now  -elevate scrotum, recommended jock strap for when he leaves  -schedule colace    Polysubstance abuse (HCC) No signs of withdrawal, currently stable UDS positive for cocaine, amphetamines and opiates on 10/05/23   He is wanting to go to rehab to get off meth after discharge; TOC following Hep C ab positive Nicotine  patch as requested   Substance induced mood disorder (HCC) Rule out acute psychiatric event/paranoia -left AMA 5/16 am - he felt his life was in danger  -psychiatry consulted -appreciate insight recommendations, no change to medications at this time -continue seroquel  50mg  nightly    Topical dermatitis -Likely secondary to positioning - benadryl  cream TID as appropriate  GERD/Barrett's esophagus/varices Start PPI   DVT prophylaxis: SCDs Start: 10/07/23 1721 Place TED hose Start: 10/07/23 1721   Code Status:   Code Status: Full Code  Family Communication: None present  Status is: Inpt  Dispo: The patient is from: Home              Anticipated d/c is to: TBD              Anticipated d/c date is: TBD              Patient currently NOT medically stable for discharge given ongoing need for IV antibiotics  Consultants:  Urology, Pysch  Procedures:  None  Antimicrobials:  Vancomycin , cefepime  x10 days     Objective: Vitals:   10/11/23 1333 10/11/23 2242 10/12/23 0406 10/12/23 1136  BP: 120/65 129/85 112/74 119/82  Pulse: 70 71 (!) 59 72  Resp: 16 17 16 18   Temp: 98.3 F (36.8 C) 98 F (36.7 C) 97.6 F (36.4 C) 98.3 F (36.8 C)  TempSrc:  Oral Oral   SpO2: 100% 100% 99% 99%  Weight:      Height:        Intake/Output Summary (Last 24 hours) at 10/12/2023 1214 Last data filed at 10/12/2023 1025 Gross per 24 hour  Intake 356 ml  Output --  Net 356 ml   Filed Weights   10/07/23 1718  Weight: 72.6 kg    Examination:  General:  AAO x 3,  cooperative, no distress;   HEENT:  Normocephalic, PERRL, otherwise with in Normal limits   Neuro:  CNII-XII intact. , normal motor and sensation, reflexes intact   Lungs:   Clear to auscultation BL, Respirations unlabored,  No wheezes / crackles  Cardio:    S1/S2, RRR, No murmure, No Rubs or Gallops   Abdomen:  Soft,  non-tender, bowel sounds active all four quadrants, no guarding or peritoneal signs.  Muscular  skeletal:  Limited exam -global generalized weaknesses - in bed, able to move all 4 extremities,   2+ pulses,  symmetric, No pitting edema  Skin:  Dry, warm to touch, negative for any Rashes, Erythema edema improving in the testicular area still tender  Wounds: Please see nursing documentation       Data Reviewed: I have personally reviewed following labs and imaging studies  CBC: Recent Labs  Lab 10/06/23 1300 10/07/23 0656 10/07/23 1337 10/08/23 0713 10/10/23 0819  WBC 11.0* 5.8 6.6 5.4 6.2  NEUTROABS  --   --  4.3  --   --   HGB 11.8* 12.3* 12.3* 13.4 14.2  HCT 36.4* 37.7* 39.4 42.5 45.5  MCV 85.0 83.8 88.5 86.4 86.7  PLT 244 263 292 331 374   Basic Metabolic Panel: Recent Labs  Lab 10/06/23 1300 10/07/23 0656 10/07/23 1337 10/08/23 0713 10/10/23 0819  NA 133* 134* 135 135 136  K 3.9 3.7 4.1 4.1 4.2  CL 101 102 100 98 97*  CO2 22 25 27 27 31   GLUCOSE 181* 109* 96 94 107*  BUN 9 7 10 10 13   CREATININE 0.83 0.76 0.86 0.67 0.71  CALCIUM 8.0* 8.2* 8.4* 8.9 8.9   GFR: Estimated Creatinine Clearance: 105.4 mL/min (by C-G formula based on SCr of 0.71 mg/dL). Liver Function Tests: Recent Labs  Lab 10/06/23 1300 10/07/23 0656 10/07/23 1337  AST 15 13* 16  ALT 14 14 17   ALKPHOS 55 64 77  BILITOT 0.5 0.5 0.5  PROT 5.9* 5.7* 6.8  ALBUMIN 2.8* 2.6* 3.2*     Recent Results (from the past 240 hours)  Urine Culture     Status: None   Collection Time: 10/04/23  5:11 PM   Specimen: Urine, Clean Catch  Result Value Ref Range Status   Specimen Description URINE, CLEAN CATCH  Final   Special Requests NONE  Final   Culture   Final    NO GROWTH Performed at [facility], [address], [place], [place] [address]    Report Status 10/06/2023 FINAL  Final  Blood culture (routine x 2)     Status: None   Collection Time: 10/04/23  5:30 PM   Specimen: BLOOD   Result Value Ref Range Status   Specimen Description BLOOD RIGHT ANTECUBITAL  Final   Special Requests   Final    BOTTLES DRAWN AEROBIC AND ANAEROBIC Blood Culture results may not be optimal due to an inadequate volume of blood received in culture bottles   Culture   Final    NO GROWTH 5 DAYS Performed at [facility], [address] [place], [place] [address]    Report Status 10/09/2023 FINAL  Final  Culture, blood (Routine X 2) w Reflex to ID Panel     Status: None   Collection Time: 10/05/23  5:03 AM   Specimen: BLOOD  Result  Value Ref Range Status   Specimen Description BLOOD LEFT ANTECUBITAL  Final   Special Requests   Final    BOTTLES DRAWN AEROBIC AND ANAEROBIC Blood Culture results may not be optimal due to an inadequate volume of blood received in culture bottles   Culture   Final    NO GROWTH 5 DAYS Performed at [facility], [address] [place], [place] [address]    Report Status 10/10/2023 FINAL  Final    Radiology Studies: No results found.  Scheduled Meds:  docusate sodium   100 mg Oral BID   nicotine   21 mg Transdermal Daily   pantoprazole   40 mg Oral Daily   QUEtiapine   50 mg Oral QHS   Continuous Infusions:  ceFEPime  (MAXIPIME ) IV 2 g (10/12/23 0637)   vancomycin  1,000 mg (10/12/23 0511)     LOS: 5 days   Time spent: 55 min   SIGNED: Bobbetta Burnet, MD, FHM. FAAFP [facility],  Pager (please use [email or web address] to page/text)  Please use Epic Secure Chat for non-urgent communication (7AM-7PM) If 7PM-7AM, please contact night-coverage [email or web address],  10/12/2023, 12:15 PM

## 2023-10-13 ENCOUNTER — Other Ambulatory Visit (HOSPITAL_COMMUNITY): Payer: Self-pay

## 2023-10-13 DIAGNOSIS — N431 Infected hydrocele: Secondary | ICD-10-CM | POA: Diagnosis not present

## 2023-10-13 LAB — CBC
HCT: 42.7 % (ref 39.0–52.0)
Hemoglobin: 13.4 g/dL (ref 13.0–17.0)
MCH: 27.5 pg (ref 26.0–34.0)
MCHC: 31.4 g/dL (ref 30.0–36.0)
MCV: 87.5 fL (ref 80.0–100.0)
Platelets: 368 10*3/uL (ref 150–400)
RBC: 4.88 MIL/uL (ref 4.22–5.81)
RDW: 13 % (ref 11.5–15.5)
WBC: 11.1 10*3/uL — ABNORMAL HIGH (ref 4.0–10.5)
nRBC: 0 % (ref 0.0–0.2)

## 2023-10-13 LAB — BASIC METABOLIC PANEL WITH GFR
Anion gap: 9 (ref 5–15)
BUN: 18 mg/dL (ref 6–20)
CO2: 24 mmol/L (ref 22–32)
Calcium: 8.7 mg/dL — ABNORMAL LOW (ref 8.9–10.3)
Chloride: 100 mmol/L (ref 98–111)
Creatinine, Ser: 0.79 mg/dL (ref 0.61–1.24)
GFR, Estimated: >60 mL/min (ref >60)
Glucose, Bld: 91 mg/dL (ref 70–99)
Potassium: 4.1 mmol/L (ref 3.5–5.1)
Sodium: 133 mmol/L — ABNORMAL LOW (ref 135–145)

## 2023-10-13 MED ORDER — AMOXICILLIN-POT CLAVULANATE 875-125 MG PO TABS
1.0000 | ORAL_TABLET | Freq: Two times a day (BID) | ORAL | 0 refills | Status: DC
  Filled 2023-10-13: qty 20, 10d supply, fill #0

## 2023-10-13 MED ORDER — NICOTINE 21 MG/24HR TD PT24
21.0000 mg | MEDICATED_PATCH | Freq: Every day | TRANSDERMAL | 0 refills | Status: AC
  Filled 2023-10-13: qty 28, 28d supply, fill #0

## 2023-10-13 MED ORDER — VANCOMYCIN HCL IN DEXTROSE 1-5 GM/200ML-% IV SOLN
1000.0000 mg | Freq: Two times a day (BID) | INTRAVENOUS | Status: AC
  Administered 2023-10-13: 1000 mg via INTRAVENOUS
  Filled 2023-10-13 (×2): qty 200

## 2023-10-13 MED ORDER — SODIUM CHLORIDE 0.9 % IV SOLN
2.0000 g | Freq: Three times a day (TID) | INTRAVENOUS | Status: AC
  Administered 2023-10-13 (×2): 2 g via INTRAVENOUS
  Filled 2023-10-13 (×2): qty 12.5

## 2023-10-13 MED ORDER — DOXYCYCLINE HYCLATE 100 MG PO TABS
100.0000 mg | ORAL_TABLET | Freq: Two times a day (BID) | ORAL | Status: DC
  Administered 2023-10-14: 100 mg via ORAL
  Filled 2023-10-13: qty 1

## 2023-10-13 MED ORDER — OXYCODONE HCL 5 MG PO TABS
10.0000 mg | ORAL_TABLET | ORAL | Status: DC | PRN
  Administered 2023-10-13 – 2023-10-18 (×25): 10 mg via ORAL
  Filled 2023-10-13 (×25): qty 2

## 2023-10-13 MED ORDER — QUETIAPINE FUMARATE 50 MG PO TABS
50.0000 mg | ORAL_TABLET | Freq: Every day | ORAL | 0 refills | Status: AC
  Filled 2023-10-13: qty 30, 30d supply, fill #0

## 2023-10-13 MED ORDER — AMOXICILLIN-POT CLAVULANATE 875-125 MG PO TABS: 1.0000 | ORAL_TABLET | Freq: Two times a day (BID) | ORAL | Status: DC

## 2023-10-13 MED ORDER — BUPRENORPHINE HCL-NALOXONE HCL 2-0.5 MG SL SUBL
1.0000 | SUBLINGUAL_TABLET | Freq: Every day | SUBLINGUAL | 0 refills | Status: AC
  Filled 2023-10-13: qty 3, 3d supply, fill #0

## 2023-10-13 MED ORDER — TRAMADOL HCL 50 MG PO TABS
50.0000 mg | ORAL_TABLET | Freq: Three times a day (TID) | ORAL | 0 refills | Status: AC | PRN
  Filled 2023-10-13: qty 10, 4d supply, fill #0

## 2023-10-13 MED ORDER — DOXYCYCLINE HYCLATE 100 MG PO TABS
100.0000 mg | ORAL_TABLET | Freq: Two times a day (BID) | ORAL | 0 refills | Status: DC
  Filled 2023-10-13: qty 20, 10d supply, fill #0

## 2023-10-13 NOTE — Plan of Care (Signed)
 Patient rated scrotum pain 8/10, pt address goal is to be able to move with minimal pain, patient ambulated in room, OOB to shower independently, adequate food intake, cont x2, plan of care continued.   Problem: Education: Goal: Knowledge of General Education information will improve Description: Including pain rating scale, medication(s)/side effects and non-pharmacologic comfort measures Outcome: Progressing   Problem: Health Behavior/Discharge Planning: Goal: Ability to manage health-related needs will improve Outcome: Progressing   Problem: Clinical Measurements: Goal: Ability to maintain clinical measurements within normal limits will improve Outcome: Progressing Goal: Will remain free from infection Outcome: Progressing Goal: Diagnostic test results will improve Outcome: Progressing Goal: Respiratory complications will improve Outcome: Progressing Goal: Cardiovascular complication will be avoided Outcome: Progressing   Problem: Activity: Goal: Risk for activity intolerance will decrease Outcome: Progressing   Problem: Nutrition: Goal: Adequate nutrition will be maintained Outcome: Progressing   Problem: Coping: Goal: Level of anxiety will decrease Outcome: Progressing   Problem: Elimination: Goal: Will not experience complications related to bowel motility Outcome: Progressing Goal: Will not experience complications related to urinary retention Outcome: Progressing   Problem: Pain Managment: Goal: General experience of comfort will improve and/or be controlled Outcome: Progressing   Problem: Safety: Goal: Ability to remain free from injury will improve Outcome: Progressing   Problem: Skin Integrity: Goal: Risk for impaired skin integrity will decrease Outcome: Progressing

## 2023-10-13 NOTE — Progress Notes (Signed)
 PROGRESS NOTE    Chris Randall  HYQ:[id number] DOB: Oct 29, 1975 DOA: 10/07/2023 PCP: Patient, No Pcp Per   Brief Narrative:  Chris Randall is a 48 y.o. male with medical history significant of polysubstance use (EtOH, opioids, methamphetamine, cocaine, tobacco) with substance-induced mood disorder, GERD/Barrett's esophagus, homelessness who presented to ED with left testicular swelling. He was admitted on 5/13 for same complaints to [facility] for UTI/pyelonephritis and scrotal cellulitis and substance induced mood disorder with psychosis/hallucinations and paranoia. He left AMA 5/16 at 9:45AM, refused to be seen by urology and wanted pain medication. He has now returned.  Subjective: The patient was seen and examined this morning, still complaining of testicular edema erythema but improving, but ambulation and exertion pain radiates to the groin and abdominal area   Discharged on oral antibiotics but in a.m. so he could go to rehab center   Assessment & Plan:   Principal Problem:   Infected hydrocele Active Problems:   Polysubstance abuse (HCC)   Substance induced mood disorder (HCC)   GERD/Barrett's esophagus/varices   Tobacco abuse  Infected hydrocele Questionable UTI/hematuria  - Cellulitis 48 year old presenting to ED with worsening pain, swelling of his left scrotum and imaging finding of infected/reactive hydrocele  - Main hemodynamically stable,  -Continue antibiotics to vanc/cefepime  given his drug use hx -GC/Chlamydia, RPR and HIV negative -Cultures reviewed, no growth to date  - Consulted with pharmacy in anticipation of changing antibiotics to p.o. Augmentin  plus doxycycline   -Urology recommending 10 days of antibiotics and to transition to oral once culture results and sensitivities are finalized. ---Growth on cultures -transitioning to p.o. ABX No findings or concerns for Fournier's.   -Pain medication discontinued, continue oral pain meds -elevate scrotum,  recommended jock strap for when he leaves      Polysubstance abuse (HCC) No signs of withdrawal, currently stable UDS positive for cocaine, amphetamines and opiates on 10/05/23  He is wanting to go to rehab to get off meth after discharge; TOC following And planning to go to [facility] rehab in a.m. 10/14/2023  Hep C ab positive Nicotine  patch as requested   Substance induced mood disorder (HCC) Rule out acute psychiatric event/paranoia -left AMA 5/16 am - he felt his life was in danger  -psychiatry consulted -appreciate insight recommendations, no change to medications at this time -continue seroquel  50mg  nightly    Topical dermatitis -Likely secondary to positioning - benadryl  cream TID as appropriate  GERD/Barrett's esophagus/varices Start PPI   DVT prophylaxis: SCDs Start: 10/07/23 1721 Place TED hose Start: 10/07/23 1721   Code Status:   Code Status: Full Code  Family Communication: None present  Status is: Inpt  Dispo: The patient is from: Home              Anticipated d/c is to: TBD              Anticipated d/c date is: TBD Anticipated discharge in a.m.    And planning to go to [facility] rehab in a.m. 10/14/2023     Consultants:  Urology, Pysch  Procedures:  None  Antimicrobials:  Vancomycin , cefepime  x10 days     Objective: Vitals:   10/12/23 0406 10/12/23 1136 10/12/23 2029 10/13/23 0433  BP: 112/74 119/82 116/74 105/61  Pulse: (!) 59 72 79 62  Resp: 16 18 18 18   Temp: 97.6 F (36.4 C) 98.3 F (36.8 C) 98.1 F (36.7 C) 97.7 F (36.5 C)  TempSrc: Oral  Oral Oral  SpO2: 99% 99% 99%  97%  Weight:      Height:       No intake or output data in the 24 hours ending 10/13/23 1219  Filed Weights   10/07/23 1718  Weight: 72.6 kg         General:  AAO x 3,  cooperative, no distress;   HEENT:  Normocephalic, PERRL, otherwise with in Normal limits   Neuro:  CNII-XII intact. , normal motor and sensation, reflexes intact   Lungs:   Clear to  auscultation BL, Respirations unlabored,  No wheezes / crackles  Cardio:    S1/S2, RRR, No murmure, No Rubs or Gallops   Abdomen:  Soft, non-tender, bowel sounds active all four quadrants, no guarding or peritoneal signs.  Muscular  skeletal:  Limited exam -global generalized weaknesses - in bed, able to move all 4 extremities,   2+ pulses,  symmetric, No pitting edema  Skin:  Dry, warm to touch, negative for any Rashes, Erythema edema-improving  Wounds: Please see nursing documentation        Data Reviewed: I have personally reviewed following labs and imaging studies  CBC: Recent Labs  Lab 10/07/23 0656 10/07/23 1337 10/08/23 0713 10/10/23 0819 10/13/23 0604  WBC 5.8 6.6 5.4 6.2 11.1*  NEUTROABS  --  4.3  --   --   --   HGB 12.3* 12.3* 13.4 14.2 13.4  HCT 37.7* 39.4 42.5 45.5 42.7  MCV 83.8 88.5 86.4 86.7 87.5  PLT 263 292 331 374 368   Basic Metabolic Panel: Recent Labs  Lab 10/07/23 0656 10/07/23 1337 10/08/23 0713 10/10/23 0819 10/13/23 0604  NA 134* 135 135 136 133*  K 3.7 4.1 4.1 4.2 4.1  CL 102 100 98 97* 100  CO2 25 27 27 31 24   GLUCOSE 109* 96 94 107* 91  BUN 7 10 10 13 18   CREATININE 0.76 0.86 0.67 0.71 0.79  CALCIUM 8.2* 8.4* 8.9 8.9 8.7*   GFR: Estimated Creatinine Clearance: 105.4 mL/min (by C-G formula based on SCr of 0.79 mg/dL). Liver Function Tests: Recent Labs  Lab 10/06/23 1300 10/07/23 0656 10/07/23 1337  AST 15 13* 16  ALT 14 14 17   ALKPHOS 55 64 77  BILITOT 0.5 0.5 0.5  PROT 5.9* 5.7* 6.8  ALBUMIN 2.8* 2.6* 3.2*     Recent Results (from the past 240 hours)  Urine Culture     Status: None   Collection Time: 10/04/23  5:11 PM   Specimen: Urine, Clean Catch  Result Value Ref Range Status   Specimen Description URINE, CLEAN CATCH  Final   Special Requests NONE  Final   Culture   Final    NO GROWTH Performed at [facility], [address] [place], [place] [address]    Report Status 10/06/2023 FINAL  Final  Blood  culture (routine x 2)     Status: None   Collection Time: 10/04/23  5:30 PM   Specimen: BLOOD  Result Value Ref Range Status   Specimen Description BLOOD RIGHT ANTECUBITAL  Final   Special Requests   Final    BOTTLES DRAWN AEROBIC AND ANAEROBIC Blood Culture results may not be optimal due to an inadequate volume of blood received in culture bottles   Culture   Final    NO GROWTH 5 DAYS Performed at [facility], [address] [place], [place] [address]    Report Status 10/09/2023 FINAL  Final  Culture, blood (Routine X 2) w Reflex to ID Panel  Status: None   Collection Time: 10/05/23  5:03 AM   Specimen: BLOOD  Result Value Ref Range Status   Specimen Description BLOOD LEFT ANTECUBITAL  Final   Special Requests   Final    BOTTLES DRAWN AEROBIC AND ANAEROBIC Blood Culture results may not be optimal due to an inadequate volume of blood received in culture bottles   Culture   Final    NO GROWTH 5 DAYS Performed at [facility], [address], [place], [place] [address]    Report Status 10/10/2023 FINAL  Final    Radiology Studies: No results found.  Scheduled Meds:  [START ON 10/14/2023] amoxicillin-clavulanate  1 tablet Oral Q12H   docusate sodium   100 mg Oral BID   nicotine   21 mg Transdermal Daily   pantoprazole   40 mg Oral Daily   QUEtiapine   50 mg Oral QHS   Continuous Infusions:  ceFEPime  (MAXIPIME ) IV     vancomycin        LOS: 6 days   Time spent: 55 min   SIGNED: Bobbetta Burnet, MD, FHM. FAAFP [facility],  Pager (please use [email or web address] to page/text)  Please use Epic Secure Chat for non-urgent communication (7AM-7PM) If 7PM-7AM, please contact night-coverage [email or web address],  10/13/2023, 12:19 PM

## 2023-10-13 NOTE — Plan of Care (Signed)

## 2023-10-14 ENCOUNTER — Other Ambulatory Visit (HOSPITAL_COMMUNITY): Payer: Self-pay

## 2023-10-14 ENCOUNTER — Encounter (HOSPITAL_COMMUNITY): Payer: Self-pay | Admitting: Family Medicine

## 2023-10-14 DIAGNOSIS — N431 Infected hydrocele: Secondary | ICD-10-CM | POA: Diagnosis not present

## 2023-10-14 MED ORDER — AMOXICILLIN-POT CLAVULANATE 875-125 MG PO TABS: 1.0000 | ORAL_TABLET | Freq: Two times a day (BID) | ORAL | Status: DC

## 2023-10-14 MED ORDER — DOXYCYCLINE HYCLATE 100 MG PO TABS
100.0000 mg | ORAL_TABLET | Freq: Two times a day (BID) | ORAL | Status: DC
  Administered 2023-10-15 (×2): 100 mg via ORAL
  Filled 2023-10-14 (×2): qty 1

## 2023-10-14 MED ORDER — VANCOMYCIN HCL IN DEXTROSE 1-5 GM/200ML-% IV SOLN
1000.0000 mg | Freq: Two times a day (BID) | INTRAVENOUS | Status: AC
  Administered 2023-10-14 (×2): 1000 mg via INTRAVENOUS
  Filled 2023-10-14 (×2): qty 200

## 2023-10-14 MED ORDER — AMOXICILLIN-POT CLAVULANATE 875-125 MG PO TABS
1.0000 | ORAL_TABLET | Freq: Two times a day (BID) | ORAL | Status: DC
  Administered 2023-10-15 (×2): 1 via ORAL
  Filled 2023-10-14 (×2): qty 1

## 2023-10-14 MED ORDER — SODIUM CHLORIDE 0.9 % IV SOLN
2.0000 g | Freq: Three times a day (TID) | INTRAVENOUS | Status: AC
  Administered 2023-10-14 (×3): 2 g via INTRAVENOUS
  Filled 2023-10-14 (×3): qty 12.5

## 2023-10-14 NOTE — Plan of Care (Signed)

## 2023-10-14 NOTE — Progress Notes (Signed)
 PROGRESS NOTE    Chris Randall  OAC:[id number] DOB: January 27, 1976 DOA: 10/07/2023 PCP: Patient, No Pcp Per   Brief Narrative:  Chris Randall is a 48 y.o. male with medical history significant of polysubstance use (EtOH, opioids, methamphetamine, cocaine, tobacco) with substance-induced mood disorder, GERD/Barrett's esophagus, homelessness who presented to ED with left testicular swelling. He was admitted on 5/13 for same complaints to [facility] for UTI/pyelonephritis and scrotal cellulitis and substance induced mood disorder with psychosis/hallucinations and paranoia. He left AMA 5/16 at 9:45AM, refused to be seen by urology and wanted pain medication. He has now returned.  Subjective:  Patient was seen and examined this morning, stable no acute distress with exception of reporting tenderness pain and radiation to groin and pelvic area, left testicular area but improved swelling and redness    Assessment & Plan:   Principal Problem:   Infected hydrocele Active Problems:   Polysubstance abuse (HCC)   Substance induced mood disorder (HCC)   GERD/Barrett's esophagus/varices   Tobacco abuse  Infected hydrocele Questionable UTI/hematuria  - Cellulitis 48 year old presenting to ED with worsening pain, swelling of his left scrotum and imaging finding of infected/reactive hydrocele  - Main hemodynamically stable,  -Continue antibiotics to vanc/cefepime  given his drug use hx -GC/Chlamydia, RPR and HIV negative -Cultures reviewed, no growth to date  - Consulted with pharmacy in anticipation of changing antibiotics to  Due to worsening pain continuing IV antibiotics for today   Anticipating to switching to po Abx PO Augmentin + doxycycline  on D/c   -Urology recommending 10 days of antibiotics and to transition to oral once culture results and sensitivities are finalized. ---Growth on cultures -transitioning to p.o. ABX No findings or concerns for Fournier's.   -Pain medication  discontinued, continue oral pain meds -elevate scrotum, recommended jock strap for when he leaves      Polysubstance abuse (HCC) No signs of withdrawal, currently stable UDS positive for cocaine, amphetamines and opiates on 10/05/23  He is wanting to go to rehab to get off meth after discharge; TOC following And planning to go to [facility] rehab in a.m. 10/14/2023  Hep C ab positive Nicotine  patch as requested   Substance induced mood disorder (HCC) Rule out acute psychiatric event/paranoia -left AMA 5/16 am - he felt his life was in danger  -psychiatry consulted -appreciate insight recommendations, no change to medications at this time -continue seroquel  50mg  nightly    Topical dermatitis -Likely secondary to positioning - benadryl  cream TID as appropriate  GERD/Barrett's esophagus/varices Start PPI   DVT prophylaxis: SCDs Start: 10/07/23 1721 Place TED hose Start: 10/07/23 1721   Code Status:   Code Status: Full Code  Family Communication: None present  Status is: Inpt  Dispo: The patient is from: Home              Anticipated d/c is to: [facility]              Anticipated d/c date is: Likely 10/15/2023, 2 Anticipated discharge in a.m.    And planning to go to [facility] rehab in a.m.     Consultants:  Urology, Pysch  Procedures:  None  Antimicrobials:  Vancomycin , cefepime  x10 days     Objective: Vitals:   10/13/23 1420 10/13/23 1625 10/13/23 1917 10/14/23 0445  BP: (!) 135/105 (!) 141/74 126/85 111/68  Pulse: 82 82 79 65  Resp: 18  20 18   Temp: 98.3 F (36.8 C)  99.1 F (37.3 C) 98.3 F (36.8 C)  TempSrc:  Oral     SpO2: 100%  98% 99%  Weight:      Height:        Intake/Output Summary (Last 24 hours) at 10/14/2023 1141 Last data filed at 10/14/2023 1610 Gross per 24 hour  Intake 800.86 ml  Output --  Net 800.86 ml    Filed Weights   10/07/23 1718  Weight: 72.6 kg          General:  AAO x 3,  cooperative, no distress;   HEENT:   Normocephalic, PERRL, otherwise with in Normal limits   Neuro:  CNII-XII intact. , normal motor and sensation, reflexes intact   Lungs:   Clear to auscultation BL, Respirations unlabored,  No wheezes / crackles  Cardio:    S1/S2, RRR, No murmure, No Rubs or Gallops   Abdomen:  Soft, non-tender, bowel sounds active all four quadrants, no guarding or peritoneal signs.  Muscular  skeletal:  Limited exam -global generalized weaknesses - in bed, able to move all 4 extremities,   2+ pulses,  symmetric, No pitting edema  Skin:  Dry, warm to touch, negative for any Rashes, Groin/testicular erythema edema improving, still tender  Wounds: Please see nursing documentation          Data Reviewed: I have personally reviewed following labs and imaging studies  CBC: Recent Labs  Lab 10/07/23 1337 10/08/23 0713 10/10/23 0819 10/13/23 0604  WBC 6.6 5.4 6.2 11.1*  NEUTROABS 4.3  --   --   --   HGB 12.3* 13.4 14.2 13.4  HCT 39.4 42.5 45.5 42.7  MCV 88.5 86.4 86.7 87.5  PLT 292 331 374 368   Basic Metabolic Panel: Recent Labs  Lab 10/07/23 1337 10/08/23 0713 10/10/23 0819 10/13/23 0604  NA 135 135 136 133*  K 4.1 4.1 4.2 4.1  CL 100 98 97* 100  CO2 27 27 31 24   GLUCOSE 96 94 107* 91  BUN 10 10 13 18   CREATININE 0.86 0.67 0.71 0.79  CALCIUM 8.4* 8.9 8.9 8.7*   GFR: Estimated Creatinine Clearance: 105.4 mL/min (by C-G formula based on SCr of 0.79 mg/dL). Liver Function Tests: Recent Labs  Lab 10/07/23 1337  AST 16  ALT 17  ALKPHOS 77  BILITOT 0.5  PROT 6.8  ALBUMIN 3.2*     Recent Results (from the past 240 hours)  Urine Culture     Status: None   Collection Time: 10/04/23  5:11 PM   Specimen: Urine, Clean Catch  Result Value Ref Range Status   Specimen Description URINE, CLEAN CATCH  Final   Special Requests NONE  Final   Culture   Final    NO GROWTH Performed at [facility], [address], [place], [place] [address]    Report Status 10/06/2023 FINAL   Final  Blood culture (routine x 2)     Status: None   Collection Time: 10/04/23  5:30 PM   Specimen: BLOOD  Result Value Ref Range Status   Specimen Description BLOOD RIGHT ANTECUBITAL  Final   Special Requests   Final    BOTTLES DRAWN AEROBIC AND ANAEROBIC Blood Culture results may not be optimal due to an inadequate volume of blood received in culture bottles   Culture   Final    NO GROWTH 5 DAYS Performed at [facility], [address] [place], [place] [address]    Report Status 10/09/2023 FINAL  Final  Culture, blood (Routine X 2) w Reflex to ID Panel  Status: None   Collection Time: 10/05/23  5:03 AM   Specimen: BLOOD  Result Value Ref Range Status   Specimen Description BLOOD LEFT ANTECUBITAL  Final   Special Requests   Final    BOTTLES DRAWN AEROBIC AND ANAEROBIC Blood Culture results may not be optimal due to an inadequate volume of blood received in culture bottles   Culture   Final    NO GROWTH 5 DAYS Performed at [facility], [address], [place], [place] [address]    Report Status 10/10/2023 FINAL  Final    Radiology Studies: No results found.  Scheduled Meds:  [START ON 10/15/2023] amoxicillin-clavulanate  1 tablet Oral Q12H   docusate sodium   100 mg Oral BID   [START ON 10/15/2023] doxycycline   100 mg Oral Q12H   nicotine   21 mg Transdermal Daily   pantoprazole   40 mg Oral Daily   QUEtiapine   50 mg Oral QHS   Continuous Infusions:  ceFEPime  (MAXIPIME ) IV 2 g (10/13/23 2122)   ceFEPime  (MAXIPIME ) IV 2 g (10/14/23 0845)   vancomycin  1,000 mg (10/13/23 1756)   vancomycin  1,000 mg (10/14/23 1000)     LOS: 7 days   Time spent: 35 min   SIGNED: Bobbetta Burnet, MD, FHM. FAAFP [facility],  Pager (please use [email or web address] to page/text)  Please use Epic Secure Chat for non-urgent communication (7AM-7PM) If 7PM-7AM, please contact night-coverage [email or web address],  10/14/2023, 11:41 AM

## 2023-10-14 NOTE — TOC Progression Note (Signed)
 Transition of Care [facility]) - Progression Note    Patient Details  Name: Chris Randall MRN: [id number] Date of Birth: 12-23-1975  Transition of Care [facility]) CM/SW Contact  Marty Sleet, LCSW Phone Number: 10/14/2023, 4:12 PM  Clinical Narrative:    [facility] unable to accept new intakes until next week. Spoke with pt as he will be medically stable for discharge over the weekend. Pt reports he has nowhere to go at discharge. He cannot stay with his sister as he called CPS on her and has received multiple threats for his life. Pt is concerned if discharged to the streets he will get killed. CSW asked pt whether he has spoken to the police in which, pt reports he was laughed at by police at [facility] when he reported.    Expected Discharge Plan: Homeless Shelter Barriers to Discharge: Continued Medical Work up  Expected Discharge Plan and Services In-house Referral: Clinical Social Work Discharge Planning Services: Other - See comment (Referral to [facility]) Post Acute Care Choice: NA Living arrangements for the past 2 months: Homeless                 DME Arranged: N/A DME Agency: NA       HH Arranged: NA           Social Determinants of Health (SDOH) Interventions SDOH Screenings   Food Insecurity: Food Insecurity Present (10/07/2023)  Housing: High Risk (10/07/2023)  Transportation Needs: Unmet Transportation Needs (10/07/2023)  Utilities: At Risk (10/07/2023)  Alcohol Screen: Low Risk  (09/19/2023)  Social Connections: Unknown (09/29/2021)   Received from [facility]  Stress: No Stress Concern Present (05/20/2021)   Received from [facility], [facility]  Tobacco Use: High Risk (10/04/2023)    Readmission Risk Interventions    10/10/2023   10:12 AM  Readmission Risk Prevention Plan  Transportation Screening Complete  PCP or Specialist Appt within 3-5 Days Patient refused  HRI or Home Care Consult Not Complete  HRI or Home Care Consult  comments N/A  Social Work Consult for Recovery Care Planning/Counseling Complete  Palliative Care Screening Not Applicable  Medication Review Oceanographer) Complete

## 2023-10-15 DIAGNOSIS — N431 Infected hydrocele: Secondary | ICD-10-CM | POA: Diagnosis not present

## 2023-10-15 MED ORDER — MORPHINE SULFATE (PF) 2 MG/ML IV SOLN: 1.0000 mg | INTRAVENOUS | Status: DC | PRN

## 2023-10-15 MED ORDER — MUSCLE RUB 10-15 % EX CREA
TOPICAL_CREAM | CUTANEOUS | Status: DC | PRN
  Administered 2023-10-15: 1 via TOPICAL
  Filled 2023-10-15: qty 85

## 2023-10-15 MED ORDER — MORPHINE SULFATE (PF) 2 MG/ML IV SOLN
1.0000 mg | Freq: Four times a day (QID) | INTRAVENOUS | Status: DC | PRN
  Administered 2023-10-15 – 2023-10-18 (×10): 1 mg via INTRAVENOUS
  Filled 2023-10-15 (×11): qty 1

## 2023-10-15 NOTE — Plan of Care (Signed)

## 2023-10-15 NOTE — Progress Notes (Signed)
 PROGRESS NOTE    Chris Randall  NFA:[id number] DOB: [birth year] DOA: 10/07/2023 PCP: Patient, No Pcp Per   Brief Narrative:  Chris Randall is a 48 y.o. male with medical history significant of polysubstance use (EtOH, opioids, methamphetamine, cocaine, tobacco) with substance-induced mood disorder, GERD/Barrett's esophagus, homelessness who presented to ED with left testicular swelling. He was admitted on 5/13 for same complaints to [facility] for UTI/pyelonephritis and scrotal cellulitis and substance induced mood disorder with psychosis/hallucinations and paranoia. He left AMA 5/16 at 9:45AM, refused to be seen by urology and wanted pain medication. He has now returned.  Subjective:  The patient was seen and examined this morning, reporting improved edema erythema, but increased tenderness which radiates to his groin and to his abdomen area.   Assessment & Plan:   Principal Problem:   Infected hydrocele Active Problems:   Polysubstance abuse (HCC)   Substance induced mood disorder (HCC)   GERD/Barrett's esophagus/varices   Tobacco abuse  Infected hydrocele W UTI/hematuria  - Cellulitis 48 year old presenting to ED with worsening pain, swelling of his left scrotum and imaging finding of infected/reactive hydrocele  - Remained hemodynamically stable  -Continue antibiotics vanc/cefepime  given his drug use hx>> switching to p.o. Abx  -GC/Chlamydia, RPR and HIV negative -Cultures reviewed, no growth to date  Switching to po Abx PO Augmentin + doxycycline    -Urology recommending 10 days of antibiotics and to transition to oral once culture results and sensitivities are finalized. ---Growth on cultures -transitioning to p.o. ABX No findings or concerns for Fournier's.   -Pain medication discontinued, continue oral pain meds -elevate scrotum, recommended jock strap for when he leaves      Polysubstance abuse (HCC) No signs of withdrawal, currently stable UDS positive for  cocaine, amphetamines and opiates on 10/05/23  He is wanting to go to rehab to get off meth after discharge; TOC following And planning to go to [facility] rehab in a.m. 10/14/2023  Hep C ab positive Nicotine  patch as requested   Substance induced mood disorder (HCC) Rule out acute psychiatric event/paranoia -left AMA 5/16 am - he felt his life was in danger  -psychiatry consulted -appreciate insight recommendations, no change to medications at this time -continue seroquel  50mg  nightly    Topical dermatitis -Likely secondary to positioning - benadryl  cream TID as appropriate  GERD/Barrett's esophagus/varices Start PPI   DVT prophylaxis: SCDs Start: 10/07/23 1721 Place TED hose Start: 10/07/23 1721   Code Status:   Code Status: Full Code  Family Communication: None present  Status is: Inpt  Dispo: The patient is from: Home              Anticipated d/c is to: [facility]              Anticipated d/c date is: Likely 10/15/2023, 2 Anticipated discharge in a.m.    And planning to go to [facility] rehab in a.m.     Consultants:  Urology, Pysch  Procedures:  None  Antimicrobials:  Vancomycin , cefepime  x10 days     Objective: Vitals:   10/14/23 0445 10/14/23 1152 10/14/23 1935 10/15/23 0424  BP: 111/68 116/74 120/62 103/63  Pulse: 65 68 71 63  Resp: 18 16 20 20   Temp: 98.3 F (36.8 C) 98 F (36.7 C) 98.4 F (36.9 C) 98.4 F (36.9 C)  TempSrc:  Oral    SpO2: 99% 100% 100% 98%  Weight:      Height:        Intake/Output Summary (Last 24  hours) at 10/15/2023 1134 Last data filed at 10/14/2023 1400 Gross per 24 hour  Intake 900 ml  Output --  Net 900 ml    Filed Weights   10/07/23 1718  Weight: 72.6 kg            General:  AAO x 3,  cooperative, no distress;   HEENT:  Normocephalic, PERRL, otherwise with in Normal limits   Neuro:  CNII-XII intact. , normal motor and sensation, reflexes intact   Lungs:   Clear to auscultation BL, Respirations unlabored,  No  wheezes / crackles  Cardio:    S1/S2, RRR, No murmure, No Rubs or Gallops   Abdomen:  Soft, non-tender, bowel sounds active all four quadrants, no guarding or peritoneal signs.  Muscular  skeletal:  Limited exam -global generalized weaknesses - in bed, able to move all 4 extremities,   2+ pulses,  symmetric, No pitting edema  Skin:  Dry, warm to touch, improved testicular edema, erythema  Wounds: Please see nursing documentation          Data Reviewed: I have personally reviewed following labs and imaging studies  CBC: Recent Labs  Lab 10/10/23 0819 10/13/23 0604  WBC 6.2 11.1*  HGB 14.2 13.4  HCT 45.5 42.7  MCV 86.7 87.5  PLT 374 368   Basic Metabolic Panel: Recent Labs  Lab 10/10/23 0819 10/13/23 0604  NA 136 133*  K 4.2 4.1  CL 97* 100  CO2 31 24  GLUCOSE 107* 91  BUN 13 18  CREATININE 0.71 0.79  CALCIUM 8.9 8.7*   GFR: Estimated Creatinine Clearance: 105.4 mL/min (by C-G formula based on SCr of 0.79 mg/dL). Liver Function Tests: No results for input(s): "AST", "ALT", "ALKPHOS", "BILITOT", "PROT", "ALBUMIN" in the last 168 hours.    No results found for this or any previous visit (from the past 240 hours).   Radiology Studies: No results found.  Scheduled Meds:  amoxicillin-clavulanate  1 tablet Oral Q12H   docusate sodium   100 mg Oral BID   doxycycline   100 mg Oral Q12H   nicotine   21 mg Transdermal Daily   pantoprazole   40 mg Oral Daily   QUEtiapine   50 mg Oral QHS   Continuous Infusions:     LOS: 8 days   Time spent: 35 min   SIGNED: Bobbetta Burnet, MD, FHM. FAAFP [facility],  Pager (please use [email or web address] to page/text)  Please use Epic Secure Chat for non-urgent communication (7AM-7PM) If 7PM-7AM, please contact night-coverage [email or web address],  10/15/2023, 11:34 AM

## 2023-10-15 NOTE — Plan of Care (Signed)
  Problem: Education: Goal: Knowledge of General Education information will improve Description: Including pain rating scale, medication(s)/side effects and non-pharmacologic comfort measures Outcome: Progressing   Problem: Clinical Measurements: Goal: Will remain free from infection Outcome: Progressing Goal: Diagnostic test results will improve Outcome: Progressing Goal: Respiratory complications will improve Outcome: Progressing Goal: Cardiovascular complication will be avoided Outcome: Progressing   Problem: Activity: Goal: Risk for activity intolerance will decrease Outcome: Progressing   Problem: Nutrition: Goal: Adequate nutrition will be maintained Outcome: Progressing   Problem: Coping: Goal: Level of anxiety will decrease Outcome: Progressing   Problem: Elimination: Goal: Will not experience complications related to bowel motility Outcome: Progressing Goal: Will not experience complications related to urinary retention Outcome: Progressing   Problem: Safety: Goal: Ability to remain free from injury will improve Outcome: Progressing   Problem: Skin Integrity: Goal: Risk for impaired skin integrity will decrease Outcome: Progressing   Problem: Health Behavior/Discharge Planning: Goal: Ability to manage health-related needs will improve Outcome: Not Progressing   Problem: Pain Managment: Goal: General experience of comfort will improve and/or be controlled Outcome: Not Progressing

## 2023-10-15 NOTE — Progress Notes (Signed)
 TOC meds in a secure bag delivered to inpt pharmacy by this RN.

## 2023-10-16 ENCOUNTER — Inpatient Hospital Stay (HOSPITAL_COMMUNITY)

## 2023-10-16 DIAGNOSIS — N431 Infected hydrocele: Secondary | ICD-10-CM | POA: Diagnosis not present

## 2023-10-16 LAB — BASIC METABOLIC PANEL WITH GFR
Anion gap: 8 (ref 5–15)
BUN: 20 mg/dL (ref 6–20)
CO2: 29 mmol/L (ref 22–32)
Calcium: 8.9 mg/dL (ref 8.9–10.3)
Chloride: 97 mmol/L — ABNORMAL LOW (ref 98–111)
Creatinine, Ser: 0.68 mg/dL (ref 0.61–1.24)
GFR, Estimated: >60 mL/min (ref >60)
Glucose, Bld: 105 mg/dL — ABNORMAL HIGH (ref 70–99)
Potassium: 3.9 mmol/L (ref 3.5–5.1)
Sodium: 134 mmol/L — ABNORMAL LOW (ref 135–145)

## 2023-10-16 LAB — CBC
HCT: 42.5 % (ref 39.0–52.0)
Hemoglobin: 13.4 g/dL (ref 13.0–17.0)
MCH: 27.4 pg (ref 26.0–34.0)
MCHC: 31.5 g/dL (ref 30.0–36.0)
MCV: 86.9 fL (ref 80.0–100.0)
Platelets: 374 10*3/uL (ref 150–400)
RBC: 4.89 MIL/uL (ref 4.22–5.81)
RDW: 13.4 % (ref 11.5–15.5)
WBC: 12.3 10*3/uL — ABNORMAL HIGH (ref 4.0–10.5)
nRBC: 0 % (ref 0.0–0.2)

## 2023-10-16 MED ORDER — SODIUM CHLORIDE 0.9 % IV SOLN
2.0000 g | Freq: Three times a day (TID) | INTRAVENOUS | Status: AC
  Administered 2023-10-16 – 2023-10-17 (×3): 2 g via INTRAVENOUS
  Filled 2023-10-16 (×3): qty 12.5

## 2023-10-16 MED ORDER — NICOTINE 21 MG/24HR TD PT24
21.0000 mg | MEDICATED_PATCH | Freq: Every day | TRANSDERMAL | Status: DC
  Filled 2023-10-16: qty 1

## 2023-10-16 MED ORDER — BUPROPION HCL ER (XL) 150 MG PO TB24
150.0000 mg | ORAL_TABLET | Freq: Every day | ORAL | Status: DC
  Administered 2023-10-16 – 2023-10-18 (×3): 150 mg via ORAL
  Filled 2023-10-16 (×3): qty 1

## 2023-10-16 MED ORDER — NICOTINE 21 MG/24HR TD PT24
21.0000 mg | MEDICATED_PATCH | Freq: Every day | TRANSDERMAL | Status: DC
  Administered 2023-10-17: 21 mg via TRANSDERMAL
  Filled 2023-10-16: qty 1

## 2023-10-16 MED ORDER — VANCOMYCIN HCL IN DEXTROSE 1-5 GM/200ML-% IV SOLN
1000.0000 mg | Freq: Two times a day (BID) | INTRAVENOUS | Status: AC
  Administered 2023-10-16 – 2023-10-17 (×4): 1000 mg via INTRAVENOUS
  Filled 2023-10-16 (×4): qty 200

## 2023-10-16 NOTE — Plan of Care (Signed)

## 2023-10-16 NOTE — Progress Notes (Signed)
 PROGRESS NOTE    Chris Randall  ZOX:[id number] DOB: 03-29-1976 DOA: 10/07/2023 PCP: Patient, No Pcp Per   Brief Narrative:  Chris Randall is a 48 y.o. male with medical history significant of polysubstance use (EtOH, opioids, methamphetamine, cocaine, tobacco) with substance-induced mood disorder, GERD/Barrett's esophagus, homelessness who presented to ED with left testicular swelling. He was admitted on 5/13 for same complaints to [facility] for UTI/pyelonephritis and scrotal cellulitis and substance induced mood disorder with psychosis/hallucinations and paranoia. He left AMA 5/16 at 9:45AM, refused to be seen by urology and wanted pain medication. He has now returned.  Subjective:  Patient was seen and examined this morning, reporting worsening tenderness, swelling of his testicle.   Assessment & Plan:   Principal Problem:   Infected hydrocele Active Problems:   Polysubstance abuse (HCC)   Substance induced mood disorder (HCC)   GERD/Barrett's esophagus/varices   Tobacco abuse  Infected hydrocele W UTI/hematuria  - Cellulitis 48 year old presenting to ED with worsening pain, swelling of his left scrotum and imaging finding of infected/reactive hydrocele  - Hemodynamically stable, had elevation in WBCs, afebrile   worsening left testicular edema and tenderness - Will pursue with focus ultrasound, and CT pelvis with contrast  -Continue antibiotics vanc/cefepime  given his drug use hx>> day #10 Due to worsening edema and tenderness reinitiating IV Vanco and cefepime   -GC/Chlamydia, RPR and HIV negative -Cultures reviewed, no growth to date  Anticipating switching to po Abx PO Augmentin + doxycycline    -Urology recommending 10 days of antibiotics and to transition to oral once culture results and sensitivities are finalized. ---Growth on cultures -transitioning to p.o. ABX No findings or concerns for Fournier's.   -Continue pain management -elevate scrotum, recommended  jock strap for when he leaves      Polysubstance abuse (HCC) No signs of withdrawal, currently stable UDS positive for cocaine, amphetamines and opiates on 10/05/23  He is wanting to go to rehab to get off meth after discharge; TOC following And planning to go to [facility] rehab in a.m. 10/14/2023  Hep C ab positive Nicotine  patch as requested   Substance induced mood disorder (HCC) Rule out acute psychiatric event/paranoia -left AMA 5/16 am - he felt his life was in danger  -psychiatry consulted -appreciate insight recommendations, no change to medications at this time -continue seroquel  50mg  nightly    Topical dermatitis -Likely secondary to positioning - benadryl  cream TID as appropriate  GERD/Barrett's esophagus/varices Start PPI   DVT prophylaxis: SCDs Start: 10/07/23 1721 Place TED hose Start: 10/07/23 1721   Code Status:   Code Status: Full Code  Family Communication: None present  Status is: Inpt  Dispo: The patient is from: Home              Anticipated d/c is to: [facility]              Anticipated d/c date is: Likely 10/15/2023, 2 Anticipated discharge in a.m.     planning to go to [facility] rehab in 1-2 days      Consultants:  Urology, Pysch  Procedures:  None  Antimicrobials:  Vancomycin , cefepime  x10 days     Objective: Vitals:   10/15/23 0424 10/15/23 1300 10/15/23 1951 10/16/23 0601  BP: 103/63 124/75 121/68 122/82  Pulse: 63 79 75 65  Resp: 20 18 20 18   Temp: 98.4 F (36.9 C) 98.2 F (36.8 C) 98.4 F (36.9 C) 98.3 F (36.8 C)  TempSrc:  Oral  Oral  SpO2: 98% 100% 98%  98%  Weight:      Height:        Intake/Output Summary (Last 24 hours) at 10/16/2023 1108 Last data filed at 10/15/2023 1400 Gross per 24 hour  Intake 240 ml  Output --  Net 240 ml    Filed Weights   10/07/23 1718  Weight: 72.6 kg           General:  AAO x 3,  cooperative, no distress;   HEENT:  Normocephalic, PERRL, otherwise with in Normal limits   Neuro:   CNII-XII intact. , normal motor and sensation, reflexes intact   Lungs:   Clear to auscultation BL, Respirations unlabored,  No wheezes / crackles  Cardio:    S1/S2, RRR, No murmure, No Rubs or Gallops   Abdomen:  Soft, non-tender, bowel sounds active all four quadrants, no guarding or peritoneal signs.  Muscular  skeletal:  Limited exam -global generalized weaknesses - in bed, able to move all 4 extremities,   2+ pulses,  symmetric, No pitting edema  Skin:  Dry, warm to touch, negative for any Rashes, Worsening testicular edema tenderness with radiation to the groin No open wounds ulcers or vesicular lesions  Wounds: Please see nursing documentation              Data Reviewed: I have personally reviewed following labs and imaging studies  CBC: Recent Labs  Lab 10/10/23 0819 10/13/23 0604 10/16/23 0630  WBC 6.2 11.1* 12.3*  HGB 14.2 13.4 13.4  HCT 45.5 42.7 42.5  MCV 86.7 87.5 86.9  PLT 374 368 374   Basic Metabolic Panel: Recent Labs  Lab 10/10/23 0819 10/13/23 0604 10/16/23 0630  NA 136 133* 134*  K 4.2 4.1 3.9  CL 97* 100 97*  CO2 31 24 29   GLUCOSE 107* 91 105*  BUN 13 18 20   CREATININE 0.71 0.79 0.68  CALCIUM 8.9 8.7* 8.9   GFR: Estimated Creatinine Clearance: 105.4 mL/min (by C-G formula based on SCr of 0.68 mg/dL). Liver Function Tests: No results for input(s): "AST", "ALT", "ALKPHOS", "BILITOT", "PROT", "ALBUMIN" in the last 168 hours.    No results found for this or any previous visit (from the past 240 hours).   Radiology Studies: US  SCROTUM W/DOPPLER Result Date: 10/16/2023 CLINICAL DATA:  Left scrotal pain for 2 weeks. EXAM: ULTRASOUND OF SCROTUM TECHNIQUE: Complete ultrasound examination of the testicles, epididymis, and other scrotal structures was performed. COMPARISON:  10/07/2023. FINDINGS: The right testis measured 4.3 x 2.7 x 2.7 cm, and the left testis measured 0.2 x 3.0 x 2.7 cm. The testes demonstrated blood flow with Doppler. There  was an unremarkable appearance of the epididymis on the right. The left epididymis appears hypoechoic and hypervascular suggesting epididymitis. There is minimal hydrocele bilaterally. IMPRESSION: 1. Findings suggest left epididymitis. 2. Minimal bilateral hydroceles. 3. No testicular abnormalities. Electronically Signed   By: Sydell Eva M.D.   On: 10/16/2023 10:39    Scheduled Meds:  buPROPion   150 mg Oral Daily   docusate sodium   100 mg Oral BID   nicotine   21 mg Transdermal Daily   pantoprazole   40 mg Oral Daily   QUEtiapine   50 mg Oral QHS   Continuous Infusions:  ceFEPime  (MAXIPIME ) IV 2 g (10/16/23 1006)   vancomycin  1,000 mg (10/16/23 1046)      LOS: 9 days   Time spent: 55 min   SIGNED: Bobbetta Burnet, MD, FHM. FAAFP [facility],  Pager (please use [email or web address] to page/text)  Please use Epic  Secure Chat for non-urgent communication (7AM-7PM) If 7PM-7AM, please contact night-coverage [email or web address],  10/16/2023, 11:08 AM

## 2023-10-17 ENCOUNTER — Inpatient Hospital Stay (HOSPITAL_COMMUNITY)

## 2023-10-17 DIAGNOSIS — N431 Infected hydrocele: Secondary | ICD-10-CM | POA: Diagnosis not present

## 2023-10-17 MED ORDER — MORPHINE SULFATE (PF) 2 MG/ML IV SOLN
1.0000 mg | Freq: Once | INTRAVENOUS | Status: AC
  Administered 2023-10-17: 1 mg via INTRAVENOUS
  Filled 2023-10-17: qty 1

## 2023-10-17 MED ORDER — SODIUM CHLORIDE (PF) 0.9 % IJ SOLN
INTRAMUSCULAR | Status: AC
  Filled 2023-10-17: qty 50

## 2023-10-17 MED ORDER — IOHEXOL 300 MG/ML  SOLN
100.0000 mL | Freq: Once | INTRAMUSCULAR | Status: AC | PRN
Start: 2023-10-17 — End: 2023-10-17
  Administered 2023-10-17: 100 mL via INTRAVENOUS

## 2023-10-17 MED ORDER — NICOTINE 21 MG/24HR TD PT24
21.0000 mg | MEDICATED_PATCH | Freq: Every day | TRANSDERMAL | Status: DC
  Administered 2023-10-17 – 2023-10-18 (×2): 21 mg via TRANSDERMAL
  Filled 2023-10-17 (×2): qty 1

## 2023-10-17 NOTE — Plan of Care (Signed)

## 2023-10-17 NOTE — Progress Notes (Signed)
 PROGRESS NOTE    Chris Randall  XBJ:[id number] DOB: 04-06-1976 DOA: 10/07/2023 PCP: Patient, No Pcp Per   Brief Narrative:  Chris Randall is a 48 y.o. male with medical history significant of polysubstance use (EtOH, opioids, methamphetamine, cocaine, tobacco) with substance-induced mood disorder, GERD/Barrett's esophagus, homelessness who presented to ED with left testicular swelling. He was admitted on 5/13 for same complaints to [facility] for UTI/pyelonephritis and scrotal cellulitis and substance induced mood disorder with psychosis/hallucinations and paranoia. He left AMA 5/16 at 9:45AM, refused to be seen by urology and wanted pain medication. He has now returned.  Subjective:  Patient was seen and examined this morning, stable no acute distress, still complaining of left testicular edema and tenderness improved erythema   Assessment & Plan:   Principal Problem:   Infected hydrocele Active Problems:   Polysubstance abuse (HCC)   Substance induced mood disorder (HCC)   GERD/Barrett's esophagus/varices   Tobacco abuse  Infected hydrocele W UTI/hematuria  - Cellulitis Still complaining of testicular swelling of his initial imaging finding of infected/reactive hydrocele  -Repeat ultrasound 10/16/2023 reported: left epididymitis. Minimal bilateral hydroceles - Afebrile, leukocytosis  worsening left testicular edema and tenderness - Pending CT pelvis with contrast  -Continue antibiotics vanc/cefepime  given his drug use hx>> day #10 Due to worsening edema and tenderness reinitiating IV Vanco and cefepime   -GC/Chlamydia, RPR and HIV negative -Cultures reviewed, no growth to date  Anticipating switching to po Abx PO Augmentin + doxycycline    -Urology recommending 10 days of antibiotics and to transition to oral once culture results and sensitivities are finalized. ---Growth on cultures - No findings or concerns for Fournier's.   -Continue pain management -elevate scrotum,  recommended jock strap      Polysubstance abuse (HCC) No signs of withdrawal, currently stable UDS positive for cocaine, amphetamines and opiates on 10/05/23  He is wanting to go to rehab to get off meth after discharge; TOC following And planning to go to [facility] rehab in a.m. 10/14/2023  H/p Hep C ab positive -Per patient s/p treatment as an outpatient Nicotine  patch as requested   Substance induced mood disorder (HCC) Rule out acute psychiatric event/paranoia -left AMA 5/16 am - he felt his life was in danger  -psychiatry consulted -appreciate insight recommendations, no change to medications at this time -continue seroquel  50mg  nightly  -Added Wellbutrin to help with mood stabilization and craving for nicotine    Topical dermatitis -Likely secondary to positioning - benadryl  cream TID as appropriate  GERD/Barrett's esophagus/varices Start PPI   DVT prophylaxis: SCDs Start: 10/07/23 1721 Place TED hose Start: 10/07/23 1721   Code Status:   Code Status: Full Code  Family Communication: None present  Status is: Inpt  Dispo: The patient is from: Home              Anticipated d/c is to: [facility]              Anticipated d/c date is: Likely 10/15/2023, 2 Anticipated discharge in a.m.     planning to go to [facility] rehab in 1-2 days      Consultants:  Urology, Pysch  Procedures:  None  Antimicrobials:  Vancomycin , cefepime  x10 days     Objective: Vitals:   10/15/23 1951 10/16/23 0601 10/16/23 2144 10/17/23 0418  BP: 121/68 122/82 122/80 110/70  Pulse: 75 65 75 72  Resp: 20 18 18 18   Temp: 98.4 F (36.9 C) 98.3 F (36.8 C) 98.6 F (37 C) 98.2 F (36.8 C)  TempSrc:  Oral Oral Oral  SpO2: 98% 98% 100% 97%  Weight:      Height:        Intake/Output Summary (Last 24 hours) at 10/17/2023 1022 Last data filed at 10/17/2023 0858 Gross per 24 hour  Intake 240 ml  Output --  Net 240 ml    Filed Weights   10/07/23 1718  Weight: 72.6 kg             General:  AAO x 3,  cooperative, no distress;   HEENT:  Normocephalic, PERRL, otherwise with in Normal limits   Neuro:  CNII-XII intact. , normal motor and sensation, reflexes intact   Lungs:   Clear to auscultation BL, Respirations unlabored,  No wheezes / crackles  Cardio:    S1/S2, RRR, No murmure, No Rubs or Gallops   Abdomen:  Soft, non-tender, bowel sounds active all four quadrants, no guarding or peritoneal signs.  Muscular  skeletal:  Limited exam -global generalized weaknesses - in bed, able to move all 4 extremities,   2+ pulses,  symmetric, No pitting edema  Skin:  Dry, warm to touch, negative for any Rashes, Left testicular edema improved erythema, tenderness  Wounds: Please see nursing documentation              Data Reviewed: I have personally reviewed following labs and imaging studies  CBC: Recent Labs  Lab 10/13/23 0604 10/16/23 0630  WBC 11.1* 12.3*  HGB 13.4 13.4  HCT 42.7 42.5  MCV 87.5 86.9  PLT 368 374   Basic Metabolic Panel: Recent Labs  Lab 10/13/23 0604 10/16/23 0630  NA 133* 134*  K 4.1 3.9  CL 100 97*  CO2 24 29  GLUCOSE 91 105*  BUN 18 20  CREATININE 0.79 0.68  CALCIUM 8.7* 8.9   GFR: Estimated Creatinine Clearance: 105.4 mL/min (by C-G formula based on SCr of 0.68 mg/dL). Liver Function Tests: No results for input(s): "AST", "ALT", "ALKPHOS", "BILITOT", "PROT", "ALBUMIN" in the last 168 hours.    No results found for this or any previous visit (from the past 240 hours).   Radiology Studies: US  SCROTUM W/DOPPLER Result Date: 10/16/2023 CLINICAL DATA:  Left scrotal pain for 2 weeks. EXAM: ULTRASOUND OF SCROTUM TECHNIQUE: Complete ultrasound examination of the testicles, epididymis, and other scrotal structures was performed. COMPARISON:  10/07/2023. FINDINGS: The right testis measured 4.3 x 2.7 x 2.7 cm, and the left testis measured 0.2 x 3.0 x 2.7 cm. The testes demonstrated blood flow with Doppler. There was an unremarkable  appearance of the epididymis on the right. The left epididymis appears hypoechoic and hypervascular suggesting epididymitis. There is minimal hydrocele bilaterally. IMPRESSION: 1. Findings suggest left epididymitis. 2. Minimal bilateral hydroceles. 3. No testicular abnormalities. Electronically Signed   By: Sydell Eva M.D.   On: 10/16/2023 10:39    Scheduled Meds:  buPROPion  150 mg Oral Daily   docusate sodium   100 mg Oral BID   nicotine   21 mg Transdermal Daily   pantoprazole   40 mg Oral Daily   QUEtiapine   50 mg Oral QHS   Continuous Infusions:  vancomycin  1,000 mg (10/17/23 0909)      LOS: 10 days   Time spent: 55 min   SIGNED: Bobbetta Burnet, MD, FHM. FAAFP [facility],  Pager (please use [email or web address] to page/text)  Please use Epic Secure Chat for non-urgent communication (7AM-7PM) If 7PM-7AM, please contact night-coverage [email or web address],  10/17/2023, 10:22 AM

## 2023-10-18 ENCOUNTER — Other Ambulatory Visit (HOSPITAL_COMMUNITY): Payer: Self-pay

## 2023-10-18 DIAGNOSIS — N431 Infected hydrocele: Secondary | ICD-10-CM | POA: Diagnosis not present

## 2023-10-18 MED ORDER — DOXYCYCLINE HYCLATE 100 MG PO TABS
100.0000 mg | ORAL_TABLET | Freq: Two times a day (BID) | ORAL | 0 refills | Status: AC
  Filled 2023-10-18: qty 6, 3d supply, fill #0

## 2023-10-18 MED ORDER — BUPROPION HCL ER (XL) 150 MG PO TB24
150.0000 mg | ORAL_TABLET | Freq: Every day | ORAL | 2 refills | Status: AC
  Filled 2023-10-18: qty 30, 30d supply, fill #0

## 2023-10-18 MED ORDER — AMOXICILLIN-POT CLAVULANATE 875-125 MG PO TABS
1.0000 | ORAL_TABLET | Freq: Two times a day (BID) | ORAL | 0 refills | Status: AC
  Filled 2023-10-18: qty 6, 3d supply, fill #0

## 2023-10-18 MED ORDER — AMOXICILLIN-POT CLAVULANATE 875-125 MG PO TABS
1.0000 | ORAL_TABLET | Freq: Two times a day (BID) | ORAL | Status: DC
  Administered 2023-10-18: 1 via ORAL
  Filled 2023-10-18: qty 1

## 2023-10-18 MED ORDER — MUSCLE RUB 10-15 % EX CREA
1.0000 | TOPICAL_CREAM | CUTANEOUS | 0 refills | Status: AC | PRN
  Filled 2023-10-18: qty 85, 5d supply, fill #0

## 2023-10-18 MED ORDER — DOXYCYCLINE HYCLATE 100 MG PO TABS
100.0000 mg | ORAL_TABLET | Freq: Two times a day (BID) | ORAL | Status: DC
  Administered 2023-10-18: 100 mg via ORAL
  Filled 2023-10-18: qty 1

## 2023-10-18 NOTE — Progress Notes (Signed)
 Discharge instructions given to patient, questions asked, answered, but patient asking for more clarification. Flavia Hughs Department Director to room to talk with patient. Discharge medications delivered to patient at bedside and discharge medications stored in main [facility] pharmacy delivered to patient D Rosevelt Constable RN

## 2023-10-18 NOTE — Discharge Summary (Signed)
 Physician Discharge Summary   Patient: Chris Randall MRN: [id number] DOB: [birth year]  Admit date:     10/07/2023  Discharge date: 10/18/23  Discharge Physician: Bobbetta Burnet   PCP: Patient, No Pcp Per   Recommendations at discharge:   Follow with PCP in 1-2 week Follow-up with psych in 2-4 weeks To continue current medications Abstain from alcohol and drugs  Discharge Diagnoses: Principal Problem:   Infected hydrocele Active Problems:   Polysubstance abuse (HCC)   Substance induced mood disorder (HCC)   GERD/Barrett's esophagus/varices   Tobacco abuse  Resolved Problems:   * No resolved hospital problems. *  Hospital Course: 48 y.o. male with medical history significant for polysubstance use (EtOH, opioids, methamphetamine, cocaine, tobacco) with substance-induced mood disorder, GERD/Barrett's esophagus, homelessness who is admitted with UTI and suspected left pyelonephritis.   Infected hydrocele W UTI/hematuria  - Cellulitis 48 year old presenting to ED with worsening pain, swelling of his left scrotum and imaging finding of infected/reactive hydrocele   -Reoccurring of left testicular edema erythema tenderness, followed by repeat scrotal ultrasound, data CT of pelvis area, all reporting improvement - Concluded 10 days of IV antibiotics of Vanco/cefepime , continuing with 4 more days of oral antibiotics including 14 days of antibiotics    -GC/Chlamydia, RPR and HIV negative -Cultures reviewed, no growth to date   switching to po Abx PO Augmentin + doxycycline  x 4 more days   -Urology recommending 10 days of antibiotics and to transition to oral once culture results and sensitivities are finalized. ---Growth on cultures -transitioning to p.o. ABX No findings or concerns for Fournier's.    -Continue pain management -elevate scrotum, recommended jock strap for when he leaves      Polysubstance abuse (HCC) including tobacco abuse -Started Wellbutrin for craving  and assisting with tobacco cessation Continue NicoDerm patch No signs of withdrawal on this admission UDS positive for cocaine, amphetamines and opiates on 10/05/23  He is wanting to go to rehab to get off meth after discharge; TOC following And planning to go to [facility] rehab in a.m. 10/14/2023   Hep C ab positive-per patient status post treatment     Substance induced mood disorder (HCC) Rule out acute psychiatric event/paranoia -left AMA 5/16 am - he felt his life was in danger  -psychiatry consulted -appreciate insight recommendations, no change to medications at this time -continue seroquel  50mg  nightly added Wellbutrin   Topical dermatitis - Resolved    GERD/Barrett's esophagus/varices Start PPI     Procedures performed: Scrotal ultrasound x 2, pelvic CT x 2 Disposition: Home is supposed to be discharged to homeless shelter versus [facility] rehab center Diet recommendation:  Discharge Diet Orders (From admission, onward)     Start     Ordered   10/18/23 0000  Diet - low sodium heart healthy        10/18/23 0836           Regular diet DISCHARGE MEDICATION: Allergies as of 10/18/2023       Reactions   Motrin  [ibuprofen ] Other (See Comments)   Hx of esophageal ulcers    Vicodin [hydrocodone -acetaminophen ] Other (See Comments)   Irritates esophageal ulcers         Medication List     STOP taking these medications    cephALEXin  500 MG capsule Commonly known as: KEFLEX    hydrocortisone  cream 1 %       TAKE these medications    amoxicillin-clavulanate 875-125 MG tablet Commonly known as: AUGMENTIN Take 1  tablet by mouth every 12 (twelve) hours for 3 days. Start taking on: Oct 19, 2023   buprenorphine -naloxone  2-0.5 mg Subl SL tablet Commonly known as: SUBOXONE  Place 1 tablet under the tongue daily for 3 days.   buPROPion  150 MG 24 hr tablet Commonly known as: WELLBUTRIN  XL Take 1 tablet (150 mg total) by mouth daily.   doxycycline  100 MG  tablet Commonly known as: VIBRA -TABS Take 1 tablet (100 mg total) by mouth every 12 (twelve) hours for 3 days. Start taking on: Oct 19, 2023   Muscle Rub 10-15 % Crea Apply 1 Application topically as needed for up to 5 days for muscle pain.   nicotine  21 mg/24hr patch Commonly known as: NICODERM CQ  - dosed in mg/24 hours Place 1 patch (21 mg total) onto the skin daily.   QUEtiapine  50 MG tablet Commonly known as: SEROQUEL  Take 1 tablet (50 mg total) by mouth at bedtime.   traMADol  50 MG tablet Commonly known as: ULTRAM  Take 1 tablet (50 mg total) by mouth every 8 (eight) hours as needed for up to 3 days (mild pain).        Follow-up Information     Services, [facility] Follow up.   Contact information: [address] [place] [address] [phone]                Discharge Exam: Chris Randall Weights   10/07/23 1718  Weight: 72.6 kg       General:  AAO x 3,  cooperative, no distress;   HEENT:  Normocephalic, PERRL, otherwise with in Normal limits   Neuro:  CNII-XII intact. , normal motor and sensation, reflexes intact   Lungs:   Clear to auscultation BL, Respirations unlabored,  No wheezes / crackles  Cardio:    S1/S2, RRR, No murmure, No Rubs or Gallops   Abdomen:  Soft, non-tender, bowel sounds active all four quadrants, no guarding or peritoneal signs.  Muscular  skeletal:  Limited exam -global generalized weaknesses - in bed, able to move all 4 extremities,   2+ pulses,  symmetric, No pitting edema  Skin:  Dry, warm to touch, negative for any Rashes, Improved scrotal edema erythema and tenderness  Wounds: Please see nursing documentation           Condition at discharge: good  The results of significant diagnostics from this hospitalization (including imaging, microbiology, ancillary and laboratory) are listed below for reference.   Imaging Studies: CT ABDOMEN PELVIS W CONTRAST Result Date: 10/17/2023 CLINICAL DATA:  Groin  lymphadenopathy. EXAM: CT ABDOMEN AND PELVIS WITH CONTRAST TECHNIQUE: Multidetector CT imaging of the abdomen and pelvis was performed using the standard protocol following bolus administration of intravenous contrast. RADIATION DOSE REDUCTION: This exam was performed according to the departmental dose-optimization program which includes automated exposure control, adjustment of the mA and/or kV according to patient size and/or use of iterative reconstruction technique. CONTRAST:  OMNIPAQUE  IOHEXOL  300 MG/ML  SOLN COMPARISON:  10/05/2023 FINDINGS: Lower Chest: No acute findings. Hepatobiliary: No suspicious hepatic masses identified. Small right hepatic lobe cyst noted. Gallbladder is unremarkable. No evidence of biliary ductal dilatation. Pancreas:  No mass or inflammatory changes. Spleen: Within normal limits in size and appearance. Adrenals/Urinary Tract: No suspicious masses identified. No evidence of ureteral calculi or hydronephrosis. Stomach/Bowel: No evidence of obstruction, inflammatory process or abnormal fluid collections. Normal appendix visualized. Vascular/Lymphatic: No pathologically enlarged lymph nodes. Shotty sub-cm bilateral inguinal lymph nodes are again seen, which are not pathologically enlarged. No  acute vascular findings. Reproductive: Normal size prostate. Decreased scrotal wall edema noted since prior study. Small left hydrocele noted. Other:  None. Musculoskeletal:  No suspicious bone lesions identified. IMPRESSION: No evidence of abdominal or pelvic lymphadenopathy. Decreased scrotal wall edema since prior study. Small left hydrocele. Electronically Signed   By: Marlyce Sine M.D.   On: 10/17/2023 15:13   US  SCROTUM W/DOPPLER Result Date: 10/16/2023 CLINICAL DATA:  Left scrotal pain for 2 weeks. EXAM: ULTRASOUND OF SCROTUM TECHNIQUE: Complete ultrasound examination of the testicles, epididymis, and other scrotal structures was performed. COMPARISON:  10/07/2023. FINDINGS: The  right testis measured 4.3 x 2.7 x 2.7 cm, and the left testis measured 0.2 x 3.0 x 2.7 cm. The testes demonstrated blood flow with Doppler. There was an unremarkable appearance of the epididymis on the right. The left epididymis appears hypoechoic and hypervascular suggesting epididymitis. There is minimal hydrocele bilaterally. IMPRESSION: 1. Findings suggest left epididymitis. 2. Minimal bilateral hydroceles. 3. No testicular abnormalities. Electronically Signed   By: Sydell Eva M.D.   On: 10/16/2023 10:39   US  SCROTUM W/DOPPLER Result Date: 10/07/2023 CLINICAL DATA:  Left testicular pain for 5 days. EXAM: SCROTAL ULTRASOUND DOPPLER ULTRASOUND OF THE TESTICLES TECHNIQUE: Complete ultrasound examination of the testicles, epididymis, and other scrotal structures was performed. Color and spectral Doppler ultrasound were also utilized to evaluate blood flow to the testicles. COMPARISON:  Oct 04, 2023. FINDINGS: Right testicle Measurements: 4.7 x 2.5 x 2.5 cm. No mass or microlithiasis visualized. Left testicle Measurements: 4.1 x 2.7 x 2.7 cm. No mass or microlithiasis visualized. Right epididymis:  Normal in size and appearance. Left epididymis:  Normal in size and appearance. Hydrocele: Small right hydrocele is again noted. There is now noted moderate size complex left hydrocele concerning for infection. Varicocele:  None visualized. Pulsed Doppler interrogation of both testes demonstrates normal low resistance arterial and venous waveforms bilaterally. IMPRESSION: No evidence of testicular mass or torsion. There is now noted moderate size complex left hydrocele with multiple septations concerning for infection. Electronically Signed   By: Rosalene Colon M.D.   On: 10/07/2023 14:37   ECHOCARDIOGRAM COMPLETE Result Date: 10/06/2023    ECHOCARDIOGRAM REPORT   Patient Name:   Chris Randall Date of Exam: 10/06/2023 Medical Rec #:  [id number]          Height:       64.0 in Accession #:    [id number]          Weight:       160.1 lb Date of Birth:  17-Jun-1975           BSA:          1.780 m Patient Age:    47 years           BP:           106/70 mmHg Patient Gender: M                  HR:           75 bpm. Exam Location:  Inpatient Procedure: 2D Echo, Color Doppler and Cardiac Doppler (Both Spectral and Color            Flow Doppler were utilized during procedure). Indications:    Bacteremia  History:        Patient has no prior history of Echocardiogram examinations.                 Risk Factors:Polysubstance Abuse.  Sonographer:  Sherline Distel Senior RDCS Referring Phys: [id number] Uriyah K CHIU IMPRESSIONS  1. Left ventricular ejection fraction, by estimation, is 60 to 65%. The left ventricle has normal function. The left ventricle has no regional wall motion abnormalities. Left ventricular diastolic parameters were normal.  2. Right ventricular systolic function is normal. The right ventricular size is normal. Tricuspid regurgitation signal is inadequate for assessing PA pressure.  3. The mitral valve is normal in structure. No evidence of mitral valve regurgitation. No evidence of mitral stenosis.  4. The aortic valve is tricuspid. Aortic valve regurgitation is not visualized. Aortic valve sclerosis/calcification is present, without any evidence of aortic stenosis.  5. The inferior vena cava is dilated in size with <50% respiratory variability, suggesting right atrial pressure of 15 mmHg. Conclusion(s)/Recommendation(s): No evidence of valvular vegetations on this transthoracic echocardiogram. Consider a transesophageal echocardiogram to exclude infective endocarditis if clinically indicated. FINDINGS  Left Ventricle: Left ventricular ejection fraction, by estimation, is 60 to 65%. The left ventricle has normal function. The left ventricle has no regional wall motion abnormalities. The left ventricular internal cavity size was normal in size. There is  no left ventricular hypertrophy. Left ventricular diastolic parameters were  normal. Normal left ventricular filling pressure. Right Ventricle: The right ventricular size is normal. No increase in right ventricular wall thickness. Right ventricular systolic function is normal. Tricuspid regurgitation signal is inadequate for assessing PA pressure. Left Atrium: Left atrial size was normal in size. Right Atrium: Right atrial size was normal in size. Pericardium: There is no evidence of pericardial effusion. Mitral Valve: The mitral valve is normal in structure. No evidence of mitral valve regurgitation. No evidence of mitral valve stenosis. Tricuspid Valve: The tricuspid valve is normal in structure. Tricuspid valve regurgitation is trivial. No evidence of tricuspid stenosis. Aortic Valve: The aortic valve is tricuspid. Aortic valve regurgitation is not visualized. Aortic valve sclerosis/calcification is present, without any evidence of aortic stenosis. Pulmonic Valve: The pulmonic valve was normal in structure. Pulmonic valve regurgitation is not visualized. No evidence of pulmonic stenosis. Aorta: The aortic root is normal in size and structure. Venous: The inferior vena cava is dilated in size with less than 50% respiratory variability, suggesting right atrial pressure of 15 mmHg. IAS/Shunts: No atrial level shunt detected by color flow Doppler.  LEFT VENTRICLE PLAX 2D LVIDd:         5.40 cm   Diastology LVIDs:         3.70 cm   LV e' medial:    11.65 cm/s LV PW:         0.80 cm   LV E/e' medial:  7.0 LV IVS:        0.80 cm   LV e' lateral:   15.20 cm/s LVOT diam:     2.40 cm   LV E/e' lateral: 5.4 LV SV:         77 LV SV Index:   43 LVOT Area:     4.52 cm  RIGHT VENTRICLE RV S prime:     13.10 cm/s TAPSE (M-mode): 2.7 cm LEFT ATRIUM             Index        RIGHT ATRIUM           Index LA diam:        2.30 cm 1.29 cm/m   RA Area:     16.00 cm LA Vol (A2C):   52.2 ml 29.33 ml/m  RA Volume:   40.70 ml  22.87 ml/m LA Vol (A4C):   57.0 ml 32.03 ml/m LA Biplane Vol: 57.3 ml 32.20 ml/m   AORTIC VALVE LVOT Vmax:   88.90 cm/s LVOT Vmean:  65.600 cm/s LVOT VTI:    0.170 m  AORTA Ao Root diam: 3.60 cm MITRAL VALVE MV Area (PHT): 2.59 cm    SHUNTS MV Decel Time: 293 msec    Systemic VTI:  0.17 m MV E velocity: 81.70 cm/s  Systemic Diam: 2.40 cm MV A velocity: 56.00 cm/s MV E/A ratio:  1.46 Gaylyn Keas MD Electronically signed by Gaylyn Keas MD Signature Date/Time: 10/06/2023/4:58:47 PM    Final    CT PELVIS W CONTRAST Result Date: 10/05/2023 CLINICAL DATA:  Scrotal cellulitis/swelling chronic testicle pain EXAM: CT PELVIS WITH CONTRAST TECHNIQUE: Multidetector CT imaging of the pelvis was performed using the standard protocol following the bolus administration of intravenous contrast. RADIATION DOSE REDUCTION: This exam was performed according to the departmental dose-optimization program which includes automated exposure control, adjustment of the mA and/or kV according to patient size and/or use of iterative reconstruction technique. CONTRAST:  75mL OMNIPAQUE  IOHEXOL  350 MG/ML SOLN COMPARISON:  CT 10/04/2023, scrotal ultrasound 10/04/2023 FINDINGS: Urinary Tract:  No abnormality visualized. Bowel:  Unremarkable visualized pelvic bowel loops. Vascular/Lymphatic: Aortic atherosclerosis. No aneurysm. No suspicious lymph nodes. Reproductive: Prostate unremarkable. Scrotal hydroceles. Marked scrotal edema. No evidence for rim enhancing abscess. Negative for soft tissue emphysema Other:  Negative for pelvic effusion or free air Musculoskeletal: No acute osseous abnormality. IMPRESSION: 1. Marked scrotal edema. No evidence for rim enhancing abscess or soft tissue emphysema. Scrotal hydroceles. 2. Aortic atherosclerosis. Aortic Atherosclerosis (ICD10-I70.0). Electronically Signed   By: Esmeralda Hedge M.D.   On: 10/05/2023 19:21   CT ABDOMEN PELVIS W CONTRAST Result Date: 10/04/2023 CLINICAL DATA:  Left lower quadrant abdominal pain. EXAM: CT ABDOMEN AND PELVIS WITH CONTRAST TECHNIQUE: Multidetector CT  imaging of the abdomen and pelvis was performed using the standard protocol following bolus administration of intravenous contrast. RADIATION DOSE REDUCTION: This exam was performed according to the departmental dose-optimization program which includes automated exposure control, adjustment of the mA and/or kV according to patient size and/or use of iterative reconstruction technique. CONTRAST:  75mL OMNIPAQUE  IOHEXOL  350 MG/ML SOLN COMPARISON:  04/28/2022 FINDINGS: Lower chest: No acute abnormality. Hepatobiliary: Stable 1.3 cm right hepatic cyst. Gallbladder is unremarkable. No biliary dilatation. Pancreas: Unremarkable. No pancreatic ductal dilatation or surrounding inflammatory changes. Spleen: Normal in size without focal abnormality. Adrenals/Urinary Tract: Adrenal glands are unremarkable. Kidneys are normal, without renal calculi, suspicious focal lesion, or hydronephrosis. Bladder is unremarkable. Stomach/Bowel: Stomach is within normal limits. Appendix appears normal. No evidence of bowel wall thickening, distention, or inflammatory changes. Moderate volume of stool throughout the colon. Vascular/Lymphatic: Abdominal aorta is normal in caliber with aortoiliac atherosclerotic calcification again noted. No enlarged abdominal or pelvic lymph nodes. Reproductive: Prostate is unremarkable. Other: No abdominal wall hernia or abnormality. No abdominopelvic ascites. No free air. Musculoskeletal: No acute or significant osseous findings. IMPRESSION: 1. No acute localizing findings in the abdomen or pelvis. 2.  Aortic Atherosclerosis (ICD10-I70.0). Electronically Signed   By: Mannie Seek M.D.   On: 10/04/2023 18:16   US  SCROTUM W/DOPPLER Result Date: 10/04/2023 CLINICAL DATA:  Left scrotal pain for 2 months.  Worsening x1 day. EXAM: SCROTAL ULTRASOUND DOPPLER ULTRASOUND OF THE TESTICLES TECHNIQUE: Complete ultrasound examination of the testicles, epididymis, and other scrotal structures was performed. Color  and spectral Doppler ultrasound were also utilized to evaluate blood flow to the testicles.  COMPARISON:  AP pelvis 05/04/2016 FINDINGS: Right testicle Measurements: 4.9 x 2.0 x 3.2 cm. No mass or microlithiasis visualized. Left testicle Measurements: 4.7 x 2.7 x 3.1 cm. No mass or microlithiasis visualized. Right epididymis:  Normal in size and appearance. Left epididymis:  Normal in size and appearance. Hydrocele:  Mild left and minimal right hydroceles. Varicocele:  None visualized. Pulsed Doppler interrogation of both testes demonstrates normal low resistance arterial and venous waveforms bilaterally. IMPRESSION: 1. Mild left and minimal right hydroceles. 2. Otherwise normal scrotal ultrasound. Electronically Signed   By: Bertina Broccoli M.D.   On: 10/04/2023 16:24    Microbiology: Results for orders placed or performed during the hospital encounter of 10/04/23  Urine Culture     Status: None   Collection Time: 10/04/23  5:11 PM   Specimen: Urine, Clean Catch  Result Value Ref Range Status   Specimen Description URINE, CLEAN CATCH  Final   Special Requests NONE  Final   Culture   Final    NO GROWTH Performed at [facility], [address], [place], [place] [address]    Report Status 10/06/2023 FINAL  Final  Blood culture (routine x 2)     Status: None   Collection Time: 10/04/23  5:30 PM   Specimen: BLOOD  Result Value Ref Range Status   Specimen Description BLOOD RIGHT ANTECUBITAL  Final   Special Requests   Final    BOTTLES DRAWN AEROBIC AND ANAEROBIC Blood Culture results may not be optimal due to an inadequate volume of blood received in culture bottles   Culture   Final    NO GROWTH 5 DAYS Performed at [facility], [address], [place], [place] [address]    Report Status 10/09/2023 FINAL  Final  Culture, blood (Routine X 2) w Reflex to ID Panel     Status: None   Collection Time: 10/05/23  5:03 AM   Specimen: BLOOD  Result Value Ref Range Status   Specimen  Description BLOOD LEFT ANTECUBITAL  Final   Special Requests   Final    BOTTLES DRAWN AEROBIC AND ANAEROBIC Blood Culture results may not be optimal due to an inadequate volume of blood received in culture bottles   Culture   Final    NO GROWTH 5 DAYS Performed at [facility], [address], [place], [place] [address]    Report Status 10/10/2023 FINAL  Final    Labs: CBC: Recent Labs  Lab 10/13/23 0604 10/16/23 0630  WBC 11.1* 12.3*  HGB 13.4 13.4  HCT 42.7 42.5  MCV 87.5 86.9  PLT 368 374   Basic Metabolic Panel: Recent Labs  Lab 10/13/23 0604 10/16/23 0630  NA 133* 134*  K 4.1 3.9  CL 100 97*  CO2 24 29  GLUCOSE 91 105*  BUN 18 20  CREATININE 0.79 0.68  CALCIUM 8.7* 8.9   Liver Function Tests: No results for input(s): "AST", "ALT", "ALKPHOS", "BILITOT", "PROT", "ALBUMIN" in the last 168 hours. CBG: No results for input(s): "GLUCAP" in the last 168 hours.  Discharge time spent: greater than 30 minutes.  Signed: Bobbetta Burnet, MD [facility] 10/18/2023

## 2023-10-18 NOTE — Progress Notes (Signed)
 Discharge medication delivered to [facility] Nurse D  Rosevelt Constable RN

## 2023-10-18 NOTE — TOC Transition Note (Signed)
 Transition of Care [facility]) - Discharge Note   Patient Details  Name: Chris Randall MRN: [id number] Date of Birth: [birth year]  [facility] CM/SW Contact:  Tessie Fila, RN Phone Number: 10/18/2023, 11:10 AM   Clinical Narrative:    Pt discharging to homeless shelter. Pt given instructions to call [facility] daily to confirm bed availability. Resources for substance abuse residential programs have been added to AVS. D/C meds delivered to pt room. Pt significant other to provide transportation at discharge. There are no other TOC needs at this time.   Final next level of care: Homeless Shelter Barriers to Discharge: No Barriers Identified   Patient Goals and CMS Choice Patient states their goals for this hospitalization and ongoing recovery are:: To get treatment in a residential subastance abuse program CMS Medicare.gov Compare Post Acute Care list provided to:: Other (Comment Required) (NA) Choice offered to / list presented to : NA  ownership interest in [facility].provided to:: Parent NA    Discharge Placement                       Discharge Plan and Services Additional resources added to the After Visit Summary for   In-house Referral: Clinical Social Work Discharge Planning Services: Other - See comment (Referral to [facility]) Post Acute Care Choice: NA          DME Arranged: N/A DME Agency: NA       HH Arranged: NA HH Agency: NA        Social Drivers of Health (SDOH) Interventions SDOH Screenings   Food Insecurity: Food Insecurity Present (10/07/2023)  Housing: High Risk (10/07/2023)  Transportation Needs: Unmet Transportation Needs (10/07/2023)  Utilities: At Risk (10/07/2023)  Alcohol Screen: Low Risk  (09/19/2023)  Social Connections: Unknown (09/29/2021)   Received from [facility]  Stress: No Stress Concern Present (05/20/2021)   Received from [facility]  Tobacco Use: High  Risk (10/14/2023)     Readmission Risk Interventions    10/10/2023   10:12 AM  Readmission Risk Prevention Plan  Transportation Screening Complete  PCP or Specialist Appt within 3-5 Days Patient refused  HRI or Home Care Consult Not Complete  HRI or Home Care Consult comments N/A  Social Work Consult for Recovery Care Planning/Counseling Complete  Palliative Care Screening Not Applicable  Medication Review Oceanographer) Complete

## 2023-10-18 NOTE — Progress Notes (Signed)
 When being discharged patient stated to unit director Margo Shells Mifsud RN that the staff is refusing to treat him. Patient became belligerent with unit director stating he is going to sue the hospital due to refusal to treat.

## 2023-10-18 NOTE — Plan of Care (Signed)

## 2023-10-21 ENCOUNTER — Emergency Department (HOSPITAL_COMMUNITY)

## 2023-10-21 ENCOUNTER — Other Ambulatory Visit: Payer: Self-pay

## 2023-10-21 ENCOUNTER — Ambulatory Visit (HOSPITAL_COMMUNITY): Admission: EM | Admit: 2023-10-21 | Discharge: 2023-10-21 | Disposition: A | Discharge status: Home / Self Care

## 2023-10-21 ENCOUNTER — Encounter (HOSPITAL_COMMUNITY): Payer: Self-pay

## 2023-10-21 ENCOUNTER — Emergency Department (HOSPITAL_COMMUNITY)
Admission: EM | Admit: 2023-10-21 | Discharge: 2023-10-23 | Disposition: A | Discharge status: Other Acute Inpatient Hospital | Attending: Emergency Medicine | Admitting: Emergency Medicine

## 2023-10-21 DIAGNOSIS — Z5900 Homelessness unspecified: Secondary | ICD-10-CM | POA: Diagnosis not present

## 2023-10-21 DIAGNOSIS — D72829 Elevated white blood cell count, unspecified: Secondary | ICD-10-CM | POA: Diagnosis not present

## 2023-10-21 DIAGNOSIS — R1032 Left lower quadrant pain: Secondary | ICD-10-CM | POA: Diagnosis not present

## 2023-10-21 DIAGNOSIS — R45851 Suicidal ideations: Secondary | ICD-10-CM

## 2023-10-21 DIAGNOSIS — F1994 Other psychoactive substance use, unspecified with psychoactive substance-induced mood disorder: Secondary | ICD-10-CM | POA: Diagnosis not present

## 2023-10-21 DIAGNOSIS — F1914 Other psychoactive substance abuse with psychoactive substance-induced mood disorder: Secondary | ICD-10-CM | POA: Insufficient documentation

## 2023-10-21 DIAGNOSIS — J45909 Unspecified asthma, uncomplicated: Secondary | ICD-10-CM | POA: Diagnosis not present

## 2023-10-21 DIAGNOSIS — N451 Epididymitis: Secondary | ICD-10-CM | POA: Diagnosis not present

## 2023-10-21 DIAGNOSIS — F319 Bipolar disorder, unspecified: Secondary | ICD-10-CM | POA: Diagnosis not present

## 2023-10-21 DIAGNOSIS — R1031 Right lower quadrant pain: Secondary | ICD-10-CM | POA: Diagnosis not present

## 2023-10-21 DIAGNOSIS — R4689 Other symptoms and signs involving appearance and behavior: Secondary | ICD-10-CM | POA: Diagnosis present

## 2023-10-21 LAB — ETHANOL: Alcohol, Ethyl (B): <15 mg/dL (ref <15)

## 2023-10-21 LAB — URINALYSIS, ROUTINE W REFLEX MICROSCOPIC
Bilirubin Urine: NEGATIVE
Glucose, UA: NEGATIVE mg/dL
Hgb urine dipstick: NEGATIVE
Ketones, ur: 5 mg/dL — AB
Leukocytes,Ua: NEGATIVE
Nitrite: NEGATIVE
Protein, ur: NEGATIVE mg/dL
Specific Gravity, Urine: 1.024 (ref 1.005–1.030)
pH: 6 (ref 5.0–8.0)

## 2023-10-21 LAB — COMPREHENSIVE METABOLIC PANEL WITH GFR
ALT: 16 U/L (ref 0–44)
AST: 16 U/L (ref 15–41)
Albumin: 3.9 g/dL (ref 3.5–5.0)
Alkaline Phosphatase: 92 U/L (ref 38–126)
Anion gap: 9 (ref 5–15)
BUN: 23 mg/dL — ABNORMAL HIGH (ref 6–20)
CO2: 26 mmol/L (ref 22–32)
Calcium: 9.2 mg/dL (ref 8.9–10.3)
Chloride: 104 mmol/L (ref 98–111)
Creatinine, Ser: 0.77 mg/dL (ref 0.61–1.24)
GFR, Estimated: >60 mL/min (ref >60)
Glucose, Bld: 104 mg/dL — ABNORMAL HIGH (ref 70–99)
Potassium: 3.6 mmol/L (ref 3.5–5.1)
Sodium: 139 mmol/L (ref 135–145)
Total Bilirubin: 1.2 mg/dL (ref 0.0–1.2)
Total Protein: 7.2 g/dL (ref 6.5–8.1)

## 2023-10-21 LAB — CBC WITH DIFFERENTIAL/PLATELET
Abs Immature Granulocytes: 0.07 10*3/uL (ref 0.00–0.07)
Basophils Absolute: 0.1 10*3/uL (ref 0.0–0.1)
Basophils Relative: 1 %
Eosinophils Absolute: 0 10*3/uL (ref 0.0–0.5)
Eosinophils Relative: 0 %
HCT: 41.7 % (ref 39.0–52.0)
Hemoglobin: 13.5 g/dL (ref 13.0–17.0)
Immature Granulocytes: 1 %
Lymphocytes Relative: 14 %
Lymphs Abs: 1.7 10*3/uL (ref 0.7–4.0)
MCH: 27.4 pg (ref 26.0–34.0)
MCHC: 32.4 g/dL (ref 30.0–36.0)
MCV: 84.6 fL (ref 80.0–100.0)
Monocytes Absolute: 0.9 10*3/uL (ref 0.1–1.0)
Monocytes Relative: 8 %
Neutro Abs: 8.9 10*3/uL — ABNORMAL HIGH (ref 1.7–7.7)
Neutrophils Relative %: 76 %
Platelets: 420 10*3/uL — ABNORMAL HIGH (ref 150–400)
RBC: 4.93 MIL/uL (ref 4.22–5.81)
RDW: 13.9 % (ref 11.5–15.5)
WBC: 11.7 10*3/uL — ABNORMAL HIGH (ref 4.0–10.5)
nRBC: 0 % (ref 0.0–0.2)

## 2023-10-21 LAB — RAPID URINE DRUG SCREEN, HOSP PERFORMED
Amphetamines: POSITIVE — AB
Barbiturates: NOT DETECTED
Benzodiazepines: NOT DETECTED
Cocaine: POSITIVE — AB
Opiates: POSITIVE — AB
Tetrahydrocannabinol: NOT DETECTED

## 2023-10-21 LAB — LIPASE, BLOOD: Lipase: 26 U/L (ref 11–51)

## 2023-10-21 LAB — CK: Total CK: 155 U/L (ref 49–397)

## 2023-10-21 MED ORDER — STERILE WATER FOR INJECTION IJ SOLN
INTRAMUSCULAR | Status: AC
  Filled 2023-10-21: qty 10

## 2023-10-21 MED ORDER — ONDANSETRON HCL 4 MG/2ML IJ SOLN
4.0000 mg | Freq: Once | INTRAMUSCULAR | Status: AC
  Administered 2023-10-21: 4 mg via INTRAVENOUS
  Filled 2023-10-21: qty 2

## 2023-10-21 MED ORDER — MORPHINE SULFATE (PF) 4 MG/ML IV SOLN
4.0000 mg | Freq: Once | INTRAVENOUS | Status: AC
  Administered 2023-10-21: 4 mg via INTRAVENOUS

## 2023-10-21 MED ORDER — DOXYCYCLINE HYCLATE 100 MG PO TABS: 100.0000 mg | ORAL_TABLET | Freq: Once | ORAL | Status: DC

## 2023-10-21 MED ORDER — DOXYCYCLINE HYCLATE 100 MG PO TABS
100.0000 mg | ORAL_TABLET | Freq: Two times a day (BID) | ORAL | Status: DC
  Administered 2023-10-21 – 2023-10-23 (×4): 100 mg via ORAL
  Filled 2023-10-21 (×4): qty 1

## 2023-10-21 MED ORDER — LIDOCAINE HCL (PF) 1 % IJ SOLN
1.0000 mL | Freq: Once | INTRAMUSCULAR | Status: AC
  Administered 2023-10-21: 2 mL
  Filled 2023-10-21: qty 4

## 2023-10-21 MED ORDER — SODIUM CHLORIDE 0.9 % IV BOLUS
1000.0000 mL | Freq: Once | INTRAVENOUS | Status: AC
  Administered 2023-10-21: 1000 mL via INTRAVENOUS

## 2023-10-21 MED ORDER — DIPHENHYDRAMINE HCL 25 MG PO CAPS: 50.0000 mg | ORAL_CAPSULE | Freq: Once | ORAL | Status: DC | PRN

## 2023-10-21 MED ORDER — MORPHINE SULFATE (PF) 4 MG/ML IV SOLN
4.0000 mg | Freq: Once | INTRAVENOUS | Status: DC
  Filled 2023-10-21: qty 1

## 2023-10-21 MED ORDER — CEFTRIAXONE SODIUM 1 G IJ SOLR
500.0000 mg | Freq: Once | INTRAMUSCULAR | Status: AC
  Administered 2023-10-21: 500 mg via INTRAMUSCULAR
  Filled 2023-10-21: qty 10

## 2023-10-21 MED ORDER — LORAZEPAM 2 MG/ML IJ SOLN: 2.0000 mg | Freq: Once | INTRAMUSCULAR | Status: DC | PRN

## 2023-10-21 MED ORDER — IOHEXOL 300 MG/ML  SOLN
100.0000 mL | Freq: Once | INTRAMUSCULAR | Status: AC | PRN
  Administered 2023-10-21: 100 mL via INTRAVENOUS

## 2023-10-21 MED ORDER — ZIPRASIDONE MESYLATE 20 MG IM SOLR
20.0000 mg | Freq: Once | INTRAMUSCULAR | Status: AC
  Administered 2023-10-21: 20 mg via INTRAMUSCULAR
  Filled 2023-10-21: qty 20

## 2023-10-21 NOTE — ED Provider Notes (Signed)
 [place] [facility] AT [facility] Provider Note   CSN: [id number] Arrival date & time: 10/21/23  1404     History  Chief Complaint  Patient presents with   Drug Problem   Groin Pain   Suicidal   HPI Chris Randall is a 48 y.o. male with polysubstance abuse, esophageal varices, recent admission for infected hydrocele presenting for groin pain.  Pain started about 3 days ago and is primarily in both testicles but does radiate into the lower abdomen.  Also mentions that his urine has been brown as well.  Endorses nausea but no vomiting or diarrhea.  Had normal bowel movement yesterday.  States he did take crystal meth this morning around 1 AM.  Also states he is homeless and cannot remain in a school.  While walking he has had passive thoughts of ending his life but no plan at this time.   Drug Problem  Groin Pain       Home Medications Prior to Admission medications   Medication Sig Start Date End Date Taking? Authorizing Provider  amoxicillin -clavulanate (AUGMENTIN ) 875-125 MG tablet Take 1 tablet by mouth every 12 (twelve) hours for 3 days. 10/19/23 10/22/23  Bobbetta Burnet, MD  buPROPion  (WELLBUTRIN  XL) 150 MG 24 hr tablet Take 1 tablet (150 mg total) by mouth daily. 10/18/23   Shahmehdi, Constantino Demark, MD  doxycycline  (VIBRA -TABS) 100 MG tablet Take 1 tablet (100 mg total) by mouth every 12 (twelve) hours for 3 days. 10/19/23 10/22/23  Bobbetta Burnet, MD  Menthol -Methyl Salicylate  (MUSCLE RUB) 10-15 % CREA Apply 1 Application topically as needed for up to 5 days for muscle pain. 10/18/23 10/23/23  ShahmehdiConstantino Demark, MD  nicotine  (NICODERM CQ  - DOSED IN MG/24 HOURS) 21 mg/24hr patch Place 1 patch (21 mg total) onto the skin daily. 10/14/23   Shahmehdi, Constantino Demark, MD  QUEtiapine  (SEROQUEL ) 50 MG tablet Take 1 tablet (50 mg total) by mouth at bedtime. 10/13/23 11/14/23  Bobbetta Burnet, MD      Allergies    Motrin  [ibuprofen ] and Vicodin  [hydrocodone -acetaminophen ]    Review of Systems   See HPI  Physical Exam Updated Vital Signs BP 109/63 (BP Location: Left Arm)   Pulse 94   Temp 98.4 F (36.9 C) (Oral)   Resp 19   Ht 5\' 4"  (1.626 m)   Wt 73.5 kg   SpO2 95%   BMI 27.81 kg/m  Physical Exam Vitals and nursing note reviewed. Exam conducted with a chaperone present.  HENT:     Head: Normocephalic and atraumatic.     Mouth/Throat:     Mouth: Mucous membranes are moist.  Eyes:     General:        Right eye: No discharge.        Left eye: No discharge.     Conjunctiva/sclera: Conjunctivae normal.  Cardiovascular:     Rate and Rhythm: Normal rate and regular rhythm.     Pulses: Normal pulses.     Heart sounds: Normal heart sounds.  Pulmonary:     Effort: Pulmonary effort is normal.     Breath sounds: Normal breath sounds.  Abdominal:     General: Abdomen is flat.     Palpations: Abdomen is soft.     Tenderness: There is abdominal tenderness in the right lower quadrant, suprapubic area and left lower quadrant. There is no right CVA tenderness or left CVA tenderness.  Genitourinary:    Testes:  Right: Tenderness and swelling present. Mass not present.        Left: Tenderness and swelling present. Mass not present.     Epididymis:     Right: Normal.     Left: Normal.  Skin:    General: Skin is warm and dry.  Neurological:     General: No focal deficit present.  Psychiatric:        Mood and Affect: Mood normal.     ED Results / Procedures / Treatments   Labs (all labs ordered are listed, but only abnormal results are displayed) Labs Reviewed  CBC WITH DIFFERENTIAL/PLATELET - Abnormal; Notable for the following components:      Result Value   WBC 11.7 (*)    Platelets 420 (*)    Neutro Abs 8.9 (*)    All other components within normal limits  COMPREHENSIVE METABOLIC PANEL WITH GFR - Abnormal; Notable for the following components:   Glucose, Bld 104 (*)    BUN 23 (*)    All other  components within normal limits  URINALYSIS, ROUTINE W REFLEX MICROSCOPIC - Abnormal; Notable for the following components:   Ketones, ur 5 (*)    All other components within normal limits  RAPID URINE DRUG SCREEN, HOSP PERFORMED - Abnormal; Notable for the following components:   Opiates POSITIVE (*)    Cocaine POSITIVE (*)    Amphetamines POSITIVE (*)    All other components within normal limits  LIPASE, BLOOD  CK  ETHANOL    EKG None  Radiology CT ABDOMEN PELVIS W CONTRAST Result Date: 10/21/2023 CLINICAL DATA:  Groin pain and swelling EXAM: CT ABDOMEN AND PELVIS WITH CONTRAST TECHNIQUE: Multidetector CT imaging of the abdomen and pelvis was performed using the standard protocol following bolus administration of intravenous contrast. RADIATION DOSE REDUCTION: This exam was performed according to the departmental dose-optimization program which includes automated exposure control, adjustment of the mA and/or kV according to patient size and/or use of iterative reconstruction technique. CONTRAST:  OMNIPAQUE  IOHEXOL  300 MG/ML  SOLN COMPARISON:  CT 10/17/2023, 10/04/2023, 04/28/2022 FINDINGS: Lower chest: Lung bases demonstrate no acute airspace disease. Hepatobiliary: Cyst in the right hepatic lobe. No calcified gallstone or biliary dilatation. Pancreas: Unremarkable. No pancreatic ductal dilatation or surrounding inflammatory changes. Spleen: Normal in size without focal abnormality. Adrenals/Urinary Tract: Adrenal glands are unremarkable. Kidneys are normal, without renal calculi, focal lesion, or hydronephrosis. Bladder is unremarkable. Stomach/Bowel: Stomach is within normal limits. Appendix appears normal. No evidence of bowel wall thickening, distention, or inflammatory changes. Vascular/Lymphatic: Aortic atherosclerosis. No enlarged abdominal or pelvic lymph nodes. Reproductive: Negative prostate.  Left scrotal hydrocele. Other: Negative for pelvic effusion or free air  Musculoskeletal: No acute osseous abnormality IMPRESSION: 1. No CT evidence for acute intra-abdominal or pelvic abnormality. 2. Left scrotal hydrocele. 3. Aortic atherosclerosis. Aortic Atherosclerosis (ICD10-I70.0). Electronically Signed   By: Esmeralda Hedge M.D.   On: 10/21/2023 20:55   US  SCROTUM W/DOPPLER Result Date: 10/21/2023 CLINICAL DATA:  Pain. EXAM: SCROTAL ULTRASOUND DOPPLER ULTRASOUND OF THE TESTICLES TECHNIQUE: Complete ultrasound examination of the testicles, epididymis, and other scrotal structures was performed. Color and spectral Doppler ultrasound were also utilized to evaluate blood flow to the testicles. COMPARISON:  10/16/2023. FINDINGS: Right testicle Measurements: 3.8 x 2.2 x 3.1 cm. No mass or microlithiasis visualized. Left testicle Measurements: 3.2 x 2.1 x 2.9 cm. No mass or microlithiasis visualized. Right epididymis:  Normal in size and appearance. Left epididymis: Similar hypoechoic appearance of the left epididymis with increased  vascularity, suggestive of epididymitis. Hydrocele:  Small bilateral hydroceles are again noted. Varicocele:  None visualized. Pulsed Doppler interrogation of both testes demonstrates normal low resistance arterial and venous waveforms bilaterally. IMPRESSION: 1. Findings suggestive of left epididymitis, similar to the prior exam. 2. Similar small bilateral hydroceles. Electronically Signed   By: Mannie Seek M.D.   On: 10/21/2023 18:18    Procedures Procedures    Medications Ordered in ED Medications  doxycycline  (VIBRA -TABS) tablet 100 mg (100 mg Oral Given 10/21/23 2213)  sodium chloride  0.9 % bolus 1,000 mL (0 mLs Intravenous Stopped 10/21/23 1713)  ondansetron  (ZOFRAN ) injection 4 mg (4 mg Intravenous Given 10/21/23 1611)  morphine  (PF) 4 MG/ML injection 4 mg (4 mg Intravenous Given 10/21/23 1611)  iohexol  (OMNIPAQUE ) 300 MG/ML solution 100 mL (100 mLs Intravenous Contrast Given 10/21/23 1937)  ziprasidone  (GEODON ) injection 20 mg (20 mg  Intramuscular Given 10/21/23 2022)  sterile water  (preservative free) injection (  Given 10/21/23 2023)  cefTRIAXone  (ROCEPHIN ) injection 500 mg (500 mg Intramuscular Given 10/21/23 2213)  lidocaine  (PF) (XYLOCAINE ) 1 % injection 1-2.1 mL (2 mLs Other Given 10/21/23 2213)    ED Course/ Medical Decision Making/ A&P Clinical Course as of 10/21/23 2319  Fri Oct 21, 2023  1638 Was called to bedside, patient was very concerned that his ex-girlfriend is going to come and kill him.  Afraid that if he walks out of doors that his wife may be in danger. TTS consult placed. [JR]  2018 Became more verbally combative and thoughts were more delusional.  Now accusing the staff of threatening to kill him.  Gave Geodon  and placed under IVC. [JR]  2203 Ultrasound findings suggestive of epididymitis.  Treated with IM ceftriaxone  and started him on doxycycline .  CT was negative for acute findings. [JR]    Clinical Course User Index [JR] Janalee Mcmurray, PA-C                                 Medical Decision Making Amount and/or Complexity of Data Reviewed Labs: ordered. Radiology: ordered.  Risk Prescription drug management.   Initial Impression and Ddx 48 year old well-appearing male presenting for scrotal pain, Lower abdominal pain and suicidal ideation.  Exam notable for bilateral testicular tenderness and swelling and lower abdominal tenderness.  DDx includes testicular torsion, Fournier's, appendicitis, kidney stone, diverticulitis, sepsis, other. Patient PMH that increases complexity of ED encounter: polysubstance abuse, esophageal varices  Interpretation of Diagnostics - I independent reviewed and interpreted the labs as followed: leukocytosis (11.7), urine drug positive for opiates, cocaine, amphetamine  - I independently visualized the following imaging with scope of interpretation limited to determining acute life threatening conditions related to emergency care: scrotal US , which revealed left  side epididymitis.  CT with no acute findings.  Patient Reassessment and Ultimate Disposition/Management Medical workup was overall reassuring did reveal left epididymitis.  Treated with ceftriaxone  and started him on doxycycline .  During the encounter he became increasingly more combative verbally threatening.  Thoughts seem to be more delusional as he was accusing the staff of wanting to "murder" him.  He was also very worried that his ex-girlfriend was attempting to enter the building to kill him as well.  Gave Geodon  and placed him in IVC status.  Currently in a psych hold awaiting TTS evaluation  Patient management required discussion with the following services or consulting groups:  None  Complexity of Problems Addressed Acute complicated illness or Injury  Additional Data Reviewed and Analyzed Further history obtained from: Past medical history and medications listed in the EMR and Prior ED visit notes  Patient Encounter Risk Assessment Consideration of hospitalization         Final Clinical Impression(s) / ED Diagnoses Final diagnoses:  Suicidal ideation  Epididymitis    Rx / DC Orders ED Discharge Orders     None         Janalee Mcmurray, PA-C 10/21/23 2319    Trish Furl, MD 10/23/23 [phone]

## 2023-10-21 NOTE — ED Notes (Signed)
 Pt getting agitated, in the hallway demanding a federal agent come sit with him. Pt called 911 again, phone removed from room

## 2023-10-21 NOTE — ED Notes (Signed)
 Pt reporting that he has been having suicidal thoughts and is requesting someone to come sit with him

## 2023-10-21 NOTE — ED Triage Notes (Signed)
 BIBA for groin pain, swelling- has been treated for this with abx already. Pt also is homeless, has been outside in the rain and heat, and asking for a psych evaluation, states that he isn't safe and he needs help getting off of meth. 129/81 104 HR 99% room air 107 cbg 18 left forearm, 1000 cc LR given PTA

## 2023-10-21 NOTE — Progress Notes (Signed)
   10/21/23 0941  [facility] Triage Screening (Walk-ins at [facility] only)  How Did You Hear About Us ? Self  What Is the Reason for Your Visit/Call Today? Peri presents to [facility] vol unaccompanied. Pt reports he is needing to be seen by a provider. Pt reports that someone is trying to kill him and this makes him have suicidal thoughts. Pt reports he used meth last night and began having these thoughts right after. Pt denies alcohol use, Hi and AVH. Pt reports a hx of drug use. Pt also states he is looking for long term treatment for substance use.  How Long Has This Been Causing You Problems? <Week  Have You Recently Had Any Thoughts About Hurting Yourself? Yes  How long ago did you have thoughts about hurting yourself? last night  Are You Planning to Commit Suicide/Harm Yourself At This time? No  Have you Recently Had Thoughts About Hurting Someone Marigene Shoulder? No  Are You Planning To Harm Someone At This Time? No  Physical Abuse Denies  Verbal Abuse Denies  Sexual Abuse Denies  Exploitation of patient/patient's resources Denies  Self-Neglect Denies  Possible abuse reported to: Other (Comment)  Are you currently experiencing any auditory, visual or other hallucinations? No  Have You Used Any Alcohol or Drugs in the Past 24 Hours? Yes  What Did You Use and How Much? meth, yesterday  Do you have any current medical co-morbidities that require immediate attention? No  Clinician description of patient physical appearance/behavior: slightly agitated, cooperative  What Do You Feel Would Help You the Most Today? Medication(s)  If access to [facility] was not available, would you have sought care in the Emergency Department? No  Determination of Need Routine (7 days)  Options For Referral Medication Management

## 2023-10-21 NOTE — ED Notes (Signed)
 Patient is currently in bed with security at bedside, NT is obtaining vitals on patient at this time.

## 2023-10-21 NOTE — ED Notes (Signed)
 Pt asked to change out into psych scrubs multiple times. Pt stated he is unable to dude to pain.

## 2023-10-21 NOTE — ED Notes (Signed)
 Pt states he can hear the people next door talking about him and he feels unsafe. Pt also says that people who are trying to hurt him are in the hallway. Reassured pt that he is safe, and that no one knows he is here.

## 2023-10-21 NOTE — Progress Notes (Signed)
   10/21/23 0941  [facility] Triage Screening (Walk-ins at [facility] only)  How Did You Hear About Us ? Self  What Is the Reason for Your Visit/Call Today? Beets presents to [facility] vol unaccompanied. Pt reports he is needing to be seen by a provider. Pt reports that someone is trying to kill him and this makes him have suicidal thoughts. Pt reports he used meth last night and began having these thoughts right after. Pt denies alcohol use, Hi and AVH. Pt reports a hx of drug use. Pt also states he is looking for long term treatment for substance use.  How Long Has This Been Causing You Problems? <Week  Have You Recently Had Any Thoughts About Hurting Yourself? Yes  How long ago did you have thoughts about hurting yourself? last night  Are You Planning to Commit Suicide/Harm Yourself At This time? No  Have you Recently Had Thoughts About Hurting Someone Marigene Shoulder? No  Are You Planning To Harm Someone At This Time? No  Physical Abuse Denies  Verbal Abuse Denies  Sexual Abuse Denies  Exploitation of patient/patient's resources Denies  Self-Neglect Denies  Possible abuse reported to: Other (Comment)  Are you currently experiencing any auditory, visual or other hallucinations? No  Have You Used Any Alcohol or Drugs in the Past 24 Hours? Yes  What Did You Use and How Much? meth, yesterday  Do you have any current medical co-morbidities that require immediate attention? No  Clinician description of patient physical appearance/behavior: slightly agitated, cooperative  What Do You Feel Would Help You the Most Today? Medication(s)  If access to [facility] was not available, would you have sought care in the Emergency Department? No  Options For Referral Medication Management

## 2023-10-21 NOTE — ED Notes (Addendum)
 Patient called 911 multiple times requesting that he be able to make phone calls stating that his brother is in ICU.

## 2023-10-21 NOTE — Consult Note (Signed)
 [facility] Psychiatric Consult Initial  Patient Name: .Chris Randall  MRN: [id number]  DOB: 02-Nov-1975  Consult Order details:  Orders (From admission, onward)     Start     Ordered   10/21/23 1639  CONSULT TO CALL ACT TEAM       Ordering Provider: Janalee Mcmurray, PA-C  Provider:  (Not yet assigned)  Question:  Reason for Consult?  Answer:  Psych consult   10/21/23 1638             Mode of Visit: In person    Psychiatry Consult Evaluation  Service Date: Oct 21, 2023 LOS:  LOS: 0 days  Chief Complaint "suicidal thoughts"  Primary Psychiatric Diagnoses  Substance induced mood disorder 2.   Bipolar disorder  Assessment  Chris Randall is a 48 y.o. male admitted: Presented to the ED on 10/21/2023  2:08 PM for suicidal thoughts and recent "meth use". He carries the psychiatric diagnoses of Bipolar disorder and has a past medical history of tobacco use, GERD, UTI, asthma, erosive esophageal varices, hep c.   Patient's current presentation of poor sleep, decreased energy, pressured speech, decreased appetite and occasional low mood is most consistent with a diagnosis of substance abuse use. His current presentation of persistent and ongoing meth use despite medical and psychosocial consequences (numerous hospitalizations, separation from gf,  familial disruption), cravings to use, desire to cut down use with previous unsuccessful attempts, significant amounts of time spent using meth, impaired control over use, cravings, and evidence of tolerance are consistent with amphetamine use disorder, severe.  He continues to express strong desire for inpatient rehab and shows insight into importance of going directly from hospital to rehab; agree with this goal if possible to minimize risk of return to use.  Although patient has history of suicidal ideation, he denies currently and it is not felt that she meets criteria for involuntary commitment at this time however will continue to  assess safety and most appropriate disposition. Please see plan below for detailed recommendations.   Diagnoses:  Active Hospital problems: Principal Problem:   Substance induced mood disorder (HCC) Active Problems:   Suicidal ideation    Plan   ## Psychiatric Medication Recommendations:  -Continue with current recommendations per primary team. - Consider starting cows     ## Medical Decision Making Capacity: Not specifically addressed in this encounter   ## Further Work-up:  -- None TSH, B12, folate, EKG, While pt on Qtc prolonging medications, please monitor & replete K+ to 4 and Mg2+ to 2, TOC consult for substance abuse resources, U/A, or UDS -- most recent EKG on  10/21/23 had QtC of 449 -- Pertinent labwork reviewed earlier this admission includes: CBC abnormal as a result of white blood cell count (11.7).      ## Disposition:-- Disposition: Recommend psychiatric Inpatient admission. Patient is voluntary.Recommend IVC patient if he attempts to leave     ## Behavioral / Environmental: - No specific recommendations at this time.                 ## Safety and Observation Level:  - Based on my clinical evaluation, I estimate the patient to be at moderate risk of self harm in the current setting. - At this time, we recommend  routine. This decision is based on my review of the chart including patient's history and current presentation, interview of the patient, mental status examination, and consideration of suicide risk including evaluating suicidal ideation, plan, intent, suicidal  or self-harm behaviors, risk factors, and protective factors. This judgment is based on our ability to directly address suicide risk, implement suicide prevention strategies, and develop a safety plan while the patient is in the clinical setting. Please contact our team if there is a concern that risk level has changed.   CSSR Risk Category:C-SSRS RISK CATEGORY: No Risk    Patients current symptoms  do not reflect the need for ongoing inpatient psychiatric services or inpatient psychiatric hospitalization. While future psychiatric events cannot be accurately predicted, the patient does not currently require acute inpatient psychiatric care and does not currently meet Gem  involuntary commitment criteria.   Suicide Risk Assessment: Patient has following modifiable risk factors for suicide: untreated depression and medication noncompliance, which we are addressing by recommending inpatient psychiatric admission. Patient has following non-modifiable or demographic risk factors for suicide: male gender, separation or divorce, history of suicide attempt, history of self harm behavior, and psychiatric hospitalization Patient has the following protective factors against suicide: Access to outpatient mental health care, Supportive friends, Cultural, spiritual, or religious beliefs that discourage suicide, and Frustration tolerance   Thank you for this consult request. Recommendations have been communicated to the primary team.  We recommended inpatient psychiatric admission at this time.  Chris Randall, PMHNP       History of Present Illness  Relevant Aspects of Hospital ED Course:  Admitted on 10/21/2023 for suicidal thoughts, paranoid behavior  Patient Report:  Chris Randall, 48 y.o., male patient seen face to face by this provider, consulted with Dr. Deborah Randall; and chart reviewed on 10/21/23.  On evaluation Chris Randall reports he needs help with drug abuse.  He states that he recently used "meth." Reporting that he used about 4:00 PM, but total ADD that he uses about 1:00 AM, patient endorses hearing voices, stating that he is hearing voices of the nurses staff outside his door, feeling that they are, talking about him and his situation.  Patient also states that he hears his grandson, calling his name.  Patient also endorses paranoia, feeling that people are talking about  him and that some people are out to get him and trying to kill him.  Patient states that he is unable to contract for safety, as he is having thoughts of suicidal ideations due to trying to get his life back on track.  Patient states that he is currently staying in [place] area in a tent.  He has recently been having conflicts with family and he has only limited contact with them.  He states that he just contacted his sister, to tell her that he believes she is in danger, and to tell her that he loved her.  The patient unable to remember the last time he was admitted into an inpatient psychiatric facility, endorses using only methamphetamines, UDS is negative BAL less than 15.  Patient states his appetite and sleep are poor.  Patient states that he takes Wellbutrin and Seroquel , stating that he has been taking it consistently and the last time he took it was last night.  During evaluation Chris Randall is sitting in his room, sitting up in bed and appears to be in moderate distress.  He is alert, oriented x 4, calm, cooperative and attentive. His mood is labile with congruent affect. He has pressured speech, and behavior.  He denies self-harm/homicidal ideation, psychosis, and paranoia.   Patient is endorsing suicidal thoughts, with no plan, or intent.  Patient does appear to be "high on  meth "or whatever substance he is abusing.  Patient is rapidly speaking, his eyes are bloodshot, pupils appear dilated.  Patient appears to be disheveled. During encounter, patient reports depressive symptoms of insomnia, anhedonia, decreased interest in doing things that make her happy, poor concentration, poor appetite, psychomotor retardation, decreased motivation levels, as well as feelings of hopelessness, helplessness, worthlessness.  Patient states that the symptoms have been ongoing for awhile now, worsening over the past at least 1 month.  Patient was just seen at behavioral health urgent care and left  AMA.   Psych ROS:  Depression: Endorses Anxiety: Endorses Mania (lifetime and current): Denies Psychosis: (lifetime and current): Yes  Collateral information:  Attempted to contact patient's aunt Erle Hawk, no answer will reattempt.  Review of Systems  Psychiatric/Behavioral:  Positive for substance abuse and suicidal ideas.      Psychiatric and Social History  Psychiatric History:  Information collected from Patient and chart review.    Prev Dx/Sx: Arlis Bent abuse, MDD Current Psych Provider: Denies Home Meds (current): Suboxone  Previous Med Trials: Suboxone  Therapy: DEnies   Prior Psych Hospitalization: Numerous too many to count  Prior Self Harm: Yes cutting Prior Violence: Yes   Family Psych History: Per patient " extensive mental illness" Family Hx suicide: MGGM completed by hanging per patient   Social History:  Developmental Hx: WNL Educational Hx: 8th grade Occupational Hx: Unemployed Legal Hx: Denies Living Situation: Homeless Spiritual Hx: Christian Access to weapons/lethal means: Denies    Substance History Alcohol: Denies  Illicit drugs: Crystal meth.  Prescription drug abuse: Suboxone  Rehab hx: Denies  Exam Findings  Physical Exam:  Vital Signs:  Temp:  [97.8 F (36.6 C)-97.9 F (36.6 C)] 97.8 F (36.6 C) (05/30 1429) Pulse Rate:  [92-104] 92 (05/30 1645) Resp:  [18-20] 19 (05/30 1645) BP: (106-147)/(73-93) 147/93 (05/30 1645) SpO2:  [99 %-100 %] 99 % (05/30 1645) Blood pressure (!) 147/93, pulse 92, temperature 97.8 F (36.6 C), temperature source Oral, resp. rate 19, SpO2 99%. There is no height or weight on file to calculate BMI.  Physical Exam Vitals and nursing note reviewed. Exam conducted with a chaperone present.  Neurological:     Mental Status: He is alert.  Psychiatric:        Mood and Affect: Mood is anxious. Affect is labile.        Speech: Speech is rapid and pressured.        Behavior: Behavior is agitated.         Thought Content: Thought content is paranoid. Thought content includes suicidal ideation.        Cognition and Memory: Cognition is impaired.        Judgment: Judgment is impulsive.     Mental Status Exam: General Appearance: Disheveled  Orientation:  Full (Time, Place, and Person)  Memory:  Immediate;   Fair Recent;   fair  Concentration:  Concentration: Good and Attention Span: Good  Recall:  Fair  Attention  Fair  Eye Contact:  Fair  Speech: Pressured and rapid  Language:  Good  Volume:  Normal  Mood: I'm sad  Affect:  Appropriate, Restricted, and Tearful  Thought Process:  Coherent, Linear, and Descriptions of Associations: Circumstantial  Thought Content:  Logical and Tangential  Suicidal Thoughts: Yes with no plan or intent  Homicidal Thoughts:  No  Judgement: Impaired  Insight: Poor  Psychomotor Activity:  Normal  Akathisia:  No  Fund of Knowledge:  Good       Assets:  Communication Skills Desire for Improvement Financial Resources/Insurance Leisure Time Physical Health Resilience Social Support  Cognition:  WNL  ADL's:  Intact  AIMS (if indicated):        Other History   These have been pulled in through the EMR, reviewed, and updated if appropriate.  Family History:  The patient's family history includes Cancer in an other family member; Heart failure in his mother; Hypertension in an other family member.  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    Bronchitis    Esophageal varices (HCC) 2003 vs 2008   pt reprted varices on EGD in [place] "10 to 15 years ago. No varices or portal gastropathy on EGD 08/02/16   Hematemesis 08/02/2016   Hx GERD, findings on 08/02/16 EGD: esophagitis LA grade B, ?underlying Barrett's esphagus; 4 cm HH; hemorrhagic gastritis and red blood in stomach (source or bleeding); NO VARICES OR PORTALGASTROPATHY.    Hepatitis C     Substance or medication-induced bipolar and related disorder with onset during intoxication (HCC) 12/29/2016   Opioid, cocaine, alcohol    Surgical History: Past Surgical History:  Procedure Laterality Date   ESOPHAGOGASTRODUODENOSCOPY (EGD) WITH PROPOFOL  N/A 08/02/2016   Procedure: ESOPHAGOGASTRODUODENOSCOPY (EGD) WITH PROPOFOL ;  Surgeon: Kenney Peacemaker, MD;  Location: [facility];  Service: Endoscopy;  Laterality: N/A;   ROTATOR CUFF REPAIR  1995   SHOULDER SURGERY       Medications:  No current facility-administered medications for this encounter.  Current Outpatient Medications:    amoxicillin-clavulanate (AUGMENTIN) 875-125 MG tablet, Take 1 tablet by mouth every 12 (twelve) hours for 3 days., Disp: 6 tablet, Rfl: 0   buPROPion (WELLBUTRIN XL) 150 MG 24 hr tablet, Take 1 tablet (150 mg total) by mouth daily., Disp: 30 tablet, Rfl: 2   doxycycline  (VIBRA -TABS) 100 MG tablet, Take 1 tablet (100 mg total) by mouth every 12 (twelve) hours for 3 days., Disp: 6 tablet, Rfl: 0   Menthol-Methyl Salicylate (MUSCLE RUB) 10-15 % CREA, Apply 1 Application topically as needed for up to 5 days for muscle pain., Disp: 85 g, Rfl: 0   nicotine  (NICODERM CQ  - DOSED IN MG/24 HOURS) 21 mg/24hr patch, Place 1 patch (21 mg total) onto the skin daily., Disp: 28 patch, Rfl: 0   QUEtiapine  (SEROQUEL ) 50 MG tablet, Take 1 tablet (50 mg total) by mouth at bedtime., Disp: 30 tablet, Rfl: 0  Allergies: Allergies  Allergen Reactions   Motrin  [Ibuprofen ] Other (See Comments)    Hx of esophageal ulcers    Vicodin [Hydrocodone -Acetaminophen ] Other (See Comments)    Irritates esophageal ulcers     Saahil Herbster MOTLEY-MANGRUM, PMHNP

## 2023-10-21 NOTE — ED Notes (Signed)
 Pt belongings placed in locker #43

## 2023-10-21 NOTE — ED Provider Notes (Signed)
 This provider went to assess the patient and the patient requested to leave AMA. He states that he does not feel safe here and could hear a black lady talking about him and people chattering. Verbal support provided to assess patient, however, he continued to try to walk out. He states that he is not suicidal and that he said he was suicidal so that he could get help. He states, "you cannot hold me against my will."

## 2023-10-21 NOTE — ED Notes (Signed)
 Officers to bedside due to patient having alligations for outside threats.

## 2023-10-21 NOTE — ED Notes (Addendum)
 Pt asked I speak to his twin sister Sharan Dash [phone] via phone. Pt's sister states pt has made suicidal comments & paranoid thoughts of harming him. Sister is asking for pt to get mental health services because she is scared if he gets out he will harm her or her children because he isn't in his "right mind." Pt gave verbal permission to speak to his sister in front of CT tech.

## 2023-10-21 NOTE — ED Notes (Signed)
 Pt has one black backpack in the cabinet labeled 9-12

## 2023-10-21 NOTE — Discharge Summary (Signed)
 Pt left AMA after triage and was seen by provider. Pt is discharged at this time.

## 2023-10-21 NOTE — ED Notes (Signed)
 Pt would not let me get his EKG. Told this tech he only wants the doctor taking care of him in the room.

## 2023-10-21 NOTE — ED Notes (Signed)
 Pt changed into psych scrubs all belongings have been placed behind the nurses station of 9-25. Pt has one black back pack, 1X pt belongings bag (pair of shoes, pants, shirt, and a few coins), and one umbrella.

## 2023-10-21 NOTE — ED Notes (Signed)
 Pt requested to speak to off duty officer to file a report. PD at bedside. Pt has called 911 at least twice, saying he was trying to "check on his brother"- but does not know where the brother is. When told pt to stop, he refused to hang up the phone. This RN unplugged the phone from the wall and removed from room. Pt asked to speak to charge RN- Gregory Leash E. At bedside

## 2023-10-21 NOTE — ED Notes (Signed)
 Pt was wanded by security at this time.

## 2023-10-22 LAB — URINALYSIS, ROUTINE W REFLEX MICROSCOPIC
Bacteria, UA: NONE SEEN
Bilirubin Urine: NEGATIVE
Glucose, UA: NEGATIVE mg/dL
Hgb urine dipstick: NEGATIVE
Ketones, ur: NEGATIVE mg/dL
Leukocytes,Ua: NEGATIVE
Nitrite: NEGATIVE
Protein, ur: 30 mg/dL — AB
Specific Gravity, Urine: 1.042 — ABNORMAL HIGH (ref 1.005–1.030)
pH: 5 (ref 5.0–8.0)

## 2023-10-22 MED ORDER — BUPRENORPHINE HCL-NALOXONE HCL 2-0.5 MG SL SUBL
1.0000 | SUBLINGUAL_TABLET | Freq: Every day | SUBLINGUAL | Status: DC
  Administered 2023-10-22 – 2023-10-23 (×2): 1 via SUBLINGUAL
  Filled 2023-10-22 (×2): qty 1

## 2023-10-22 MED ORDER — KETOROLAC TROMETHAMINE 30 MG/ML IJ SOLN
15.0000 mg | Freq: Once | INTRAMUSCULAR | Status: AC
  Administered 2023-10-22: 15 mg via INTRAMUSCULAR
  Filled 2023-10-22: qty 1

## 2023-10-22 MED ORDER — NICOTINE 21 MG/24HR TD PT24
21.0000 mg | MEDICATED_PATCH | Freq: Every day | TRANSDERMAL | Status: DC
  Administered 2023-10-22 – 2023-10-23 (×2): 21 mg via TRANSDERMAL
  Filled 2023-10-22 (×2): qty 1

## 2023-10-22 MED ORDER — TRAMADOL HCL 50 MG PO TABS
50.0000 mg | ORAL_TABLET | Freq: Once | ORAL | Status: AC
  Administered 2023-10-22: 50 mg via ORAL
  Filled 2023-10-22: qty 1

## 2023-10-22 MED ORDER — ACETAMINOPHEN 500 MG PO TABS
1000.0000 mg | ORAL_TABLET | Freq: Once | ORAL | Status: DC
  Filled 2023-10-22: qty 2

## 2023-10-22 MED ORDER — DOXYCYCLINE HYCLATE 100 MG PO CAPS: 100.0000 mg | ORAL_CAPSULE | Freq: Two times a day (BID) | ORAL | 0 refills | Status: AC

## 2023-10-22 MED ORDER — ACETAMINOPHEN 500 MG PO TABS
1000.0000 mg | ORAL_TABLET | Freq: Four times a day (QID) | ORAL | Status: DC | PRN
  Administered 2023-10-22: 1000 mg via ORAL
  Filled 2023-10-22: qty 2

## 2023-10-22 MED ORDER — IBUPROFEN 800 MG PO TABS
800.0000 mg | ORAL_TABLET | Freq: Four times a day (QID) | ORAL | Status: DC | PRN
  Filled 2023-10-22: qty 1

## 2023-10-22 NOTE — ED Notes (Signed)
 Pain med given. Sandwich given as well as cranberry juice for snack. Pt requesting information about [facility]. In room calmer after pain med given.

## 2023-10-22 NOTE — ED Notes (Addendum)
 [facility] accepting patient. Report # [phone]. Accepting MD Dr. Emilie Harden.

## 2023-10-22 NOTE — ED Notes (Signed)
 Pt becoming more frustrated with [employer] regulations. Requesting more food saying he had nothing to eat yesterday. Pt requesting to be "sent back out there" or "let go" if we wont feed him.

## 2023-10-22 NOTE — ED Notes (Addendum)
 Transport called.

## 2023-10-22 NOTE — ED Notes (Signed)
 Pt given phone numbers for [facility] facilities. Making phone call currently.

## 2023-10-22 NOTE — ED Provider Notes (Addendum)
 Pt accepted at [facility], Dr Diann Forth.   Pt alert, content, no acute distress noted. Normal vitals. Normal external gu exam. No scrotal or testicular swelling, abscess or focal tenderness. No skin changes, lesions, or cellulitis. Abd soft non tender. Normal appetite, is eating/drinking well.   ?recent left epididymitis diagnosis. U/s neg for torsion, ct abd/pelvis neg. Rx doxycycline  to complete course.   Pt appears stable for transfer/transport.          Guadalupe Lee, MD 10/22/23 [phone]

## 2023-10-22 NOTE — ED Notes (Signed)
 Pt came to window stating his pain is now at a 10 that the pain meds are not helping. Pt also states " my urine is brown and getting darker it must be the infection coming back". Will notify MD.

## 2023-10-22 NOTE — ED Notes (Signed)
 Patient had asked for pain medicine for his testicular pain he is experiencing. Messaged provider and got PRN ibuprofen . Went to give patient medication and now states he dont want the medication. Patient is sitting on the side of the bed holding groin area in pain. I asked the patient for a second time if he wanted the medications for the pain and he again stated "No, I need suboxone "

## 2023-10-22 NOTE — Progress Notes (Signed)
 [facility] LCSW Progress Note   10/22/2023    11:46 AM  Chris Randall   [id number]   Type of Contact and Topic:  Psychiatric Bed Placement   Pt accepted to [facility] Unit   Patient meets inpatient criteria per Arn Beth, NP  The attending provider will be Dr. Emilie Harden  Call report to [phone]  Bernhard Brine, RN @ [facility] ED notified.     Pt scheduled  to arrive at [facility] for today.    Phares Brasher, MSW, LCSW-A  11:47 AM 10/22/2023

## 2023-10-22 NOTE — ED Notes (Signed)
 Pt med compliant. Requesting more pain meds consistently. MD notified. Pt requesting a shower. Pt calling brother now.

## 2023-10-22 NOTE — ED Notes (Signed)
 Patient has been sleeping since assigned to him after medication administration. Patient has had no issues this shift. Patient does have some paranoia but not currently expressing any vocal outbursts or showing signs.

## 2023-10-22 NOTE — ED Notes (Signed)
 Pt calm, cooperative. Pacing some first thing this am. Requested sprite and given. Notified of snack and meal schedule.

## 2023-10-22 NOTE — Progress Notes (Signed)
 Patient has been denied by [facility] due to no appropriate beds available. Patient meets BH inpatient criteria per Arvell Latin, NP. Patient has been faxed out to the following facilities:   [facility] [address] [place] [place] [address] [phone]  [facility] [address], [place] [address] [phone]  [facility] [address] [place], [place] [address] [phone]  [facility] Patient Placement [facility], [place] [phone]  [facility] [address] [place] [address] [phone]  [facility] [address] [place] [address] [phone]  [facility] EFAX [address] [place], [place] [phone]  [facility] [address] [place]ella Naas [place] [address] [phone]  [facility] [address] [place] [address] [phone]  [facility] [address] [place] [address] [phone]  [facility] [address] [place], [place] [address] [phone]  [facility] [address] [place] [address] [phone]  [facility] [address], [place] [address] [phone]  [facility] [address], [place] [address] [phone]  [facility] [address] [place] [address] [phone]  [facility] [address] [place] [address] [phone]   Phares Brasher, MSW, LCSW-A  11:29 AM 10/22/2023

## 2023-10-22 NOTE — ED Notes (Signed)
 [place] report given/discussed.

## 2023-10-22 NOTE — Consult Note (Signed)
 On evaluation this morning patient remained paranoid, irritable and angry.  He was planning to go to [facility] for detox and rehabilitation but was informed that he need to be mentally stable before going to rehabilitation facility.  Patient is now accepted at [facility] and we will be contacting [facility] for transportation.

## 2023-10-22 NOTE — ED Notes (Signed)
 Report given/discussed to [facility] for potential placement.

## 2023-10-22 NOTE — ED Notes (Signed)
Pt given dinner tray and cranberry juice.

## 2023-10-22 NOTE — Discharge Instructions (Addendum)
 Transport to [facility].  Take doxycyline as prescribed.   Follow up with primary care doctor in 1-2 weeks.

## 2023-10-22 NOTE — ED Provider Notes (Signed)
 Emergency Medicine Observation Re-evaluation Note  Chris Randall is a 48 y.o. male, seen on rounds today.  Pt initially presented to the ED for complaints of Drug Problem, Groin Pain, and Suicidal Currently, the patient is resting comfortably.  Physical Exam  BP 111/66 (BP Location: Left Arm)   Pulse 68   Temp 98.2 F (36.8 C) (Oral)   Resp 16   Ht 5\' 4"  (1.626 m)   Wt 73.5 kg   SpO2 100%   BMI 27.81 kg/m  Physical Exam General: NAD Cardiac: good peripheral perfusion Lungs: bilateral chest rise Psych: resting comfortably  ED Course / MDM  EKG:   I have reviewed the labs performed to date as well as medications administered while in observation.  Recent changes in the last 24 hours include seen by TTS recommends inpatient.  Plan  Current plan is for Inpatient psychiatric placement.    Albertus Hughs, DO 10/22/23 [phone]

## 2023-10-23 NOTE — ED Notes (Signed)
 Patient off unit to facility per provider. Patient alert, calm, cooperative, no s/s of distress at this time. Belongings given to [facility] for transport. Patient ambulatory off unit, escorted and transported by [facility]

## 2023-10-23 NOTE — ED Provider Notes (Signed)
 Emergency Medicine Observation Re-evaluation Note  Chris Randall is a 48 y.o. male, seen on rounds today.  Pt initially presented to the ED for complaints of Drug Problem, Groin Pain, and Suicidal Currently, the patient is accepted for inpatient psychiatric treatment at [facility].   Physical Exam  BP 121/79 (BP Location: Right Arm)   Pulse 75   Temp 98.3 F (36.8 C) (Oral)   Resp 16   Ht 5\' 4"  (1.626 m)   Wt 73.5 kg   SpO2 100%   BMI 27.81 kg/m  Physical Exam General: Alert.  No acute distress.  Patient sitting up eating breakfast.  No distress. Cardiac: Regular.  No rub murmur gallop Lungs: Clear to auscultation Psych: Alert and cooperative.  Situationally appropriate.  ED Course / MDM  EKG:EKG Interpretation Date/Time:  Friday Oct 21 2023 16:47:38 EDT Ventricular Rate:  95 PR Interval:  130 QRS Duration:  94 QT Interval:  357 QTC Calculation: 449 R Axis:   68  Text Interpretation: Sinus rhythm Confirmed by Eve Hinders [phone]) on 10/23/2023 6:16:29 AM  I have reviewed the labs performed to date as well as medications administered while in observation.  Recent changes in the last 24 hours include accepted for inpatient psych treatment..  Plan  EMTALA completed.  Patient stable for transport.    Wynetta Heckle, MD 10/23/23 [phone]

## 2024-01-08 ENCOUNTER — Encounter (HOSPITAL_COMMUNITY): Payer: Self-pay | Admitting: Emergency Medicine

## 2024-01-08 ENCOUNTER — Emergency Department (HOSPITAL_COMMUNITY)
Admission: EM | Admit: 2024-01-08 | Discharge: 2024-01-08 | Disposition: A | Discharge status: Home / Self Care | Payer: MEDICAID | Attending: Emergency Medicine | Admitting: Emergency Medicine

## 2024-01-08 ENCOUNTER — Other Ambulatory Visit: Payer: Self-pay

## 2024-01-08 DIAGNOSIS — D72829 Elevated white blood cell count, unspecified: Secondary | ICD-10-CM | POA: Diagnosis not present

## 2024-01-08 DIAGNOSIS — K920 Hematemesis: Secondary | ICD-10-CM | POA: Diagnosis present

## 2024-01-08 LAB — COMPREHENSIVE METABOLIC PANEL WITH GFR
ALT: 21 U/L (ref 0–44)
AST: 39 U/L (ref 15–41)
Albumin: 4.4 g/dL (ref 3.5–5.0)
Alkaline Phosphatase: 111 U/L (ref 38–126)
Anion gap: 12 (ref 5–15)
BUN: 22 mg/dL — ABNORMAL HIGH (ref 6–20)
CO2: 28 mmol/L (ref 22–32)
Calcium: 9.7 mg/dL (ref 8.9–10.3)
Chloride: 99 mmol/L (ref 98–111)
Creatinine, Ser: 1.03 mg/dL (ref 0.61–1.24)
GFR, Estimated: >60 mL/min (ref >60)
Glucose, Bld: 102 mg/dL — ABNORMAL HIGH (ref 70–99)
Potassium: 3.7 mmol/L (ref 3.5–5.1)
Sodium: 139 mmol/L (ref 135–145)
Total Bilirubin: 1 mg/dL (ref 0.0–1.2)
Total Protein: 7.9 g/dL (ref 6.5–8.1)

## 2024-01-08 LAB — CBC WITH DIFFERENTIAL/PLATELET
Abs Immature Granulocytes: 0.04 K/uL (ref 0.00–0.07)
Basophils Absolute: 0.1 K/uL (ref 0.0–0.1)
Basophils Relative: 1 %
Eosinophils Absolute: 0.2 K/uL (ref 0.0–0.5)
Eosinophils Relative: 2 %
HCT: 53 % — ABNORMAL HIGH (ref 39.0–52.0)
Hemoglobin: 16.7 g/dL (ref 13.0–17.0)
Immature Granulocytes: 0 %
Lymphocytes Relative: 23 %
Lymphs Abs: 2.7 K/uL (ref 0.7–4.0)
MCH: 26.9 pg (ref 26.0–34.0)
MCHC: 31.5 g/dL (ref 30.0–36.0)
MCV: 85.3 fL (ref 80.0–100.0)
Monocytes Absolute: 1.3 K/uL — ABNORMAL HIGH (ref 0.1–1.0)
Monocytes Relative: 11 %
Neutro Abs: 7.5 K/uL (ref 1.7–7.7)
Neutrophils Relative %: 63 %
Platelets: 338 K/uL (ref 150–400)
RBC: 6.21 MIL/uL — ABNORMAL HIGH (ref 4.22–5.81)
RDW: 13.1 % (ref 11.5–15.5)
WBC: 11.8 K/uL — ABNORMAL HIGH (ref 4.0–10.5)
nRBC: 0 % (ref 0.0–0.2)

## 2024-01-08 LAB — TYPE AND SCREEN
ABO/RH(D): O POS
Antibody Screen: NEGATIVE

## 2024-01-08 MED ORDER — SODIUM CHLORIDE 0.9 % IV BOLUS
1000.0000 mL | Freq: Once | INTRAVENOUS | Status: AC
Start: 2024-01-08 — End: 2024-01-08
  Administered 2024-01-08: 1000 mL via INTRAVENOUS

## 2024-01-08 MED ORDER — PANTOPRAZOLE SODIUM 20 MG PO TBEC: 20.0000 mg | DELAYED_RELEASE_TABLET | Freq: Every day | ORAL | 0 refills | Status: AC

## 2024-01-08 MED ORDER — PANTOPRAZOLE SODIUM 40 MG IV SOLR
40.0000 mg | Freq: Once | INTRAVENOUS | Status: AC
  Administered 2024-01-08: 40 mg via INTRAVENOUS
  Filled 2024-01-08: qty 10

## 2024-01-08 NOTE — ED Provider Notes (Cosign Needed Addendum)
 [place] EMERGENCY DEPARTMENT AT [facility] Provider Note   CSN: [id number] Arrival date & time: 01/08/24  9462     Patient presents with: Hematemesis   Chris Randall is a 48 y.o. male past medical history significant for hematemesis and polysubstance abuse presents today for hematemesis that began 2 days ago.  Patient denies fever, chills, dizziness, chest pain, shortness of breath, blood in stool, dysuria, or abdominal pain.  Patient states that he has been out of his Protonix  for some time and feels that this is likely what is causing his symptoms.  Patient is also not seen gastroenterology in years.   HPI     Prior to Admission medications   Medication Sig Start Date End Date Taking? Authorizing Provider  pantoprazole  (PROTONIX ) 20 MG tablet Take 1 tablet (20 mg total) by mouth daily. 01/08/24  Yes Brentley Landfair N, PA-C  buprenorphine -naloxone  (SUBOXONE ) 2-0.5 mg SUBL SL tablet Place 1 tablet under the tongue daily.    [provider]  buPROPion  (WELLBUTRIN  XL) 150 MG 24 hr tablet Take 1 tablet (150 mg total) by mouth daily. 10/18/23   Shahmehdi, Adriana LABOR, MD  doxycycline  (VIBRAMYCIN ) 100 MG capsule Take 1 capsule (100 mg total) by mouth 2 (two) times daily. 10/22/23   Steinl, Kevin, MD  nicotine  (NICODERM CQ  - DOSED IN MG/24 HOURS) 21 mg/24hr patch Place 1 patch (21 mg total) onto the skin daily. 10/14/23   Shahmehdi, Adriana LABOR, MD  QUEtiapine  (SEROQUEL ) 50 MG tablet Take 1 tablet (50 mg total) by mouth at bedtime. 10/13/23 11/14/23  Willette Adriana LABOR, MD  traMADol  (ULTRAM ) 50 MG tablet Take 50 mg by mouth every 8 (eight) hours as needed for moderate pain (pain score 4-6) or severe pain (pain score 7-10). Patient not taking: Reported on 10/22/2023    [provider]    Allergies: Motrin  [ibuprofen ] and Vicodin [hydrocodone -acetaminophen ]    Review of Systems  Gastrointestinal:        Hematemesis    Updated Vital Signs BP 137/80 (BP Location: Left  Arm)   Pulse 92   Temp 97.7 F (36.5 C) (Oral) Comment: Simultaneous filing. User may not have seen previous data. Comment (Src): Simultaneous filing. User may not have seen previous data.  Resp 16   Ht 5' 4 (1.626 m)   Wt 81.6 kg   SpO2 98%   BMI 30.90 kg/m   Physical Exam Vitals and nursing note reviewed.  Constitutional:      General: He is not in acute distress.    Appearance: Normal appearance. He is well-developed. He is not ill-appearing or diaphoretic.  HENT:     Head: Normocephalic and atraumatic.     Right Ear: External ear normal.     Left Ear: External ear normal.     Nose: Nose normal.  Eyes:     Extraocular Movements: Extraocular movements intact.     Conjunctiva/sclera: Conjunctivae normal.  Cardiovascular:     Rate and Rhythm: Normal rate and regular rhythm.     Pulses: Normal pulses.     Heart sounds: Normal heart sounds. No murmur heard. Pulmonary:     Effort: Pulmonary effort is normal. No respiratory distress.     Breath sounds: Normal breath sounds.  Abdominal:     Palpations: Abdomen is soft.     Tenderness: There is no abdominal tenderness.  Musculoskeletal:        General: No swelling.     Cervical back: Neck supple.  Skin:  General: Skin is warm and dry.     Capillary Refill: Capillary refill takes less than 2 seconds.     Coloration: Skin is not pale.  Neurological:     General: No focal deficit present.     Mental Status: He is alert and oriented to person, place, and time.     Cranial Nerves: No cranial nerve deficit.     Sensory: No sensory deficit.     Motor: No weakness.  Psychiatric:        Mood and Affect: Mood normal.     (all labs ordered are listed, but only abnormal results are displayed) Labs Reviewed  CBC WITH DIFFERENTIAL/PLATELET - Abnormal; Notable for the following components:      Result Value   WBC 11.8 (*)    RBC 6.21 (*)    HCT 53.0 (*)    Monocytes Absolute 1.3 (*)    All other components within normal  limits  COMPREHENSIVE METABOLIC PANEL WITH GFR - Abnormal; Notable for the following components:   Glucose, Bld 102 (*)    BUN 22 (*)    All other components within normal limits  TYPE AND SCREEN    EKG: None  Radiology: No results found.   Procedures   Medications Ordered in the ED  sodium chloride  0.9 % bolus 1,000 mL (1,000 mLs Intravenous New Bag/Given 01/08/24 0751)  pantoprazole  (PROTONIX ) injection 40 mg (40 mg Intravenous Given 01/08/24 0751)                                    Medical Decision Making Amount and/or Complexity of Data Reviewed Labs: ordered.  Risk Prescription drug management.   This patient presents to the ED for concern of hematemesis differential diagnosis includes esophageal variceal rupture, Barrett's esophagus, Mallory-Weiss tear   Additional history obtained Additional history obtained from Electronic Medical Record External records from outside source obtained and reviewed including Care Everywhere   Lab Tests: I Ordered, and personally interpreted labs.  The pertinent results include: Mildly elevated BUN at 22, leukocytosis at 11.8 without left shift, possibly from hemoconcentration   Medicines ordered and prescription drug management: I ordered medication including IVF and Protonix     I have reviewed the patients home medicines and have made adjustments as needed   Problem List / ED Course: Patient unable to provide urine while in ED.  Patient has had no episodes of emesis while in ED. Considered for admission or further workup however patient's vital signs, physical exam, and labs are reassuring.  Patient is hemodynamically stable at this time.  Patient given refill of Protonix  and advised to follow-up with gastroenterology for further evaluation workup.  Patient given return precautions. Patient left prior to being presented with discharge paperwork.       Final diagnoses:  Hematemesis, unspecified whether nausea present     ED Discharge Orders          Ordered    pantoprazole  (PROTONIX ) 20 MG tablet  Daily        01/08/24 0904               Francis Ileana SAILOR, PA-C 01/08/24 0906    Francis Ileana SAILOR, PA-C 01/08/24 9092    Randol Simmonds, MD 01/09/24 1501

## 2024-01-08 NOTE — ED Triage Notes (Signed)
 Pt presents to the ED via POV with complaints of bleeding ulcers that started 2 days ago. He notes taking Protonix  in the past and has had a few episodes of hematemesis. A&Ox4 at this time. Denies CP or SOB.

## 2024-01-08 NOTE — ED Notes (Signed)
 Pt was asked to give urine sample. Pt stated that he is not able to urinate at this time.  Will ask again in 30 minutes.

## 2024-01-08 NOTE — Discharge Instructions (Signed)
 Today you were seen for hematemesis.  Your labs were reassuring.  Please pick up your Protonix  and take as prescribed.  Please follow-up with [facility] gastroenterology for further evaluation workup.  Please return to the ED if you have dizziness, increased shortness of breath, or worsening symptoms.  Thank you for letting us  treat you today. After reviewing your labs, I feel you are safe to go home. Please follow up with your PCP in the next several days and provide them with your records from this visit. Return to the Emergency Room if pain becomes severe or symptoms worsen.

## 2024-03-26 ENCOUNTER — Encounter: Payer: Self-pay | Admitting: Radiology
